# Patient Record
Sex: Female | Born: 1960 | Race: White | Hispanic: No | Marital: Married | Smoking: Never smoker
Health system: Southern US, Community
[De-identification: ages and names within clinical notes are randomized; demographics above are authoritative.]

## PROBLEM LIST (undated history)

## (undated) DIAGNOSIS — J189 Pneumonia, unspecified organism: Secondary | ICD-10-CM

## (undated) DIAGNOSIS — E039 Hypothyroidism, unspecified: Secondary | ICD-10-CM

## (undated) DIAGNOSIS — G4700 Insomnia, unspecified: Secondary | ICD-10-CM

## (undated) DIAGNOSIS — I5032 Chronic diastolic (congestive) heart failure: Secondary | ICD-10-CM

## (undated) DIAGNOSIS — Z87442 Personal history of urinary calculi: Secondary | ICD-10-CM

## (undated) DIAGNOSIS — R51 Headache: Secondary | ICD-10-CM

## (undated) DIAGNOSIS — M1990 Unspecified osteoarthritis, unspecified site: Secondary | ICD-10-CM

## (undated) DIAGNOSIS — M797 Fibromyalgia: Secondary | ICD-10-CM

## (undated) DIAGNOSIS — G43109 Migraine with aura, not intractable, without status migrainosus: Principal | ICD-10-CM

## (undated) DIAGNOSIS — K219 Gastro-esophageal reflux disease without esophagitis: Secondary | ICD-10-CM

## (undated) DIAGNOSIS — R519 Headache, unspecified: Secondary | ICD-10-CM

## (undated) DIAGNOSIS — I509 Heart failure, unspecified: Secondary | ICD-10-CM

## (undated) DIAGNOSIS — E785 Hyperlipidemia, unspecified: Secondary | ICD-10-CM

## (undated) DIAGNOSIS — I10 Essential (primary) hypertension: Secondary | ICD-10-CM

## (undated) DIAGNOSIS — Z8489 Family history of other specified conditions: Secondary | ICD-10-CM

## (undated) DIAGNOSIS — R0600 Dyspnea, unspecified: Secondary | ICD-10-CM

## (undated) DIAGNOSIS — I2510 Atherosclerotic heart disease of native coronary artery without angina pectoris: Secondary | ICD-10-CM

## (undated) HISTORY — DX: Migraine with aura, not intractable, without status migrainosus: G43.109

## (undated) HISTORY — DX: Hyperlipidemia, unspecified: E78.5

## (undated) HISTORY — PX: BACK SURGERY: SHX140

## (undated) HISTORY — DX: Headache: R51

## (undated) HISTORY — DX: Chronic diastolic (congestive) heart failure: I50.32

## (undated) HISTORY — DX: Headache, unspecified: R51.9

## (undated) HISTORY — PX: ABDOMINAL HYSTERECTOMY: SHX81

## (undated) HISTORY — PX: OTHER SURGICAL HISTORY: SHX169

---

## 1999-07-28 ENCOUNTER — Encounter: Admission: RE | Admit: 1999-07-28 | Discharge: 1999-07-28 | Payer: Self-pay | Admitting: Family Medicine

## 1999-07-28 ENCOUNTER — Encounter: Payer: Self-pay | Admitting: Family Medicine

## 1999-08-03 ENCOUNTER — Encounter: Admission: RE | Admit: 1999-08-03 | Discharge: 1999-08-03 | Payer: Self-pay | Admitting: Family Medicine

## 1999-08-03 ENCOUNTER — Encounter: Payer: Self-pay | Admitting: Family Medicine

## 1999-12-07 ENCOUNTER — Ambulatory Visit (HOSPITAL_COMMUNITY): Admission: RE | Admit: 1999-12-07 | Discharge: 1999-12-07 | Payer: Self-pay | Admitting: Family Medicine

## 1999-12-07 ENCOUNTER — Encounter: Payer: Self-pay | Admitting: Family Medicine

## 1999-12-14 ENCOUNTER — Encounter: Payer: Self-pay | Admitting: Family Medicine

## 1999-12-14 ENCOUNTER — Ambulatory Visit (HOSPITAL_COMMUNITY): Admission: RE | Admit: 1999-12-14 | Discharge: 1999-12-14 | Payer: Self-pay | Admitting: Family Medicine

## 2001-01-10 ENCOUNTER — Other Ambulatory Visit: Admission: RE | Admit: 2001-01-10 | Discharge: 2001-01-10 | Payer: Self-pay | Admitting: Family Medicine

## 2001-01-15 ENCOUNTER — Encounter: Payer: Self-pay | Admitting: Family Medicine

## 2001-01-15 ENCOUNTER — Encounter: Admission: RE | Admit: 2001-01-15 | Discharge: 2001-01-15 | Payer: Self-pay | Admitting: Family Medicine

## 2001-12-06 ENCOUNTER — Ambulatory Visit (HOSPITAL_COMMUNITY): Admission: RE | Admit: 2001-12-06 | Discharge: 2001-12-07 | Payer: Self-pay | Admitting: Neurosurgery

## 2001-12-06 ENCOUNTER — Encounter: Payer: Self-pay | Admitting: Neurosurgery

## 2002-06-05 ENCOUNTER — Encounter: Payer: Self-pay | Admitting: Family Medicine

## 2002-06-05 ENCOUNTER — Encounter: Admission: RE | Admit: 2002-06-05 | Discharge: 2002-06-05 | Payer: Self-pay | Admitting: Family Medicine

## 2003-08-10 ENCOUNTER — Ambulatory Visit (HOSPITAL_COMMUNITY): Admission: RE | Admit: 2003-08-10 | Discharge: 2003-08-10 | Payer: Self-pay | Admitting: Neurology

## 2003-12-31 ENCOUNTER — Encounter: Admission: RE | Admit: 2003-12-31 | Discharge: 2003-12-31 | Payer: Self-pay | Admitting: Otolaryngology

## 2004-02-15 ENCOUNTER — Other Ambulatory Visit: Admission: RE | Admit: 2004-02-15 | Discharge: 2004-02-15 | Payer: Self-pay | Admitting: Obstetrics and Gynecology

## 2004-06-13 ENCOUNTER — Encounter: Admission: RE | Admit: 2004-06-13 | Discharge: 2004-06-13 | Payer: Self-pay | Admitting: *Deleted

## 2004-06-22 ENCOUNTER — Encounter: Admission: RE | Admit: 2004-06-22 | Discharge: 2004-06-22 | Payer: Self-pay | Admitting: Gastroenterology

## 2004-06-24 ENCOUNTER — Encounter: Admission: RE | Admit: 2004-06-24 | Discharge: 2004-06-24 | Payer: Self-pay | Admitting: Gastroenterology

## 2004-08-29 ENCOUNTER — Ambulatory Visit: Payer: Self-pay

## 2004-11-03 ENCOUNTER — Ambulatory Visit: Payer: Self-pay | Admitting: Cardiovascular Disease

## 2004-11-03 ENCOUNTER — Ambulatory Visit (HOSPITAL_COMMUNITY): Admission: RE | Admit: 2004-11-03 | Discharge: 2004-11-03 | Payer: Self-pay | Admitting: Cardiology

## 2006-01-16 ENCOUNTER — Encounter: Admission: RE | Admit: 2006-01-16 | Discharge: 2006-01-16 | Payer: Self-pay | Admitting: Family Medicine

## 2007-02-08 ENCOUNTER — Ambulatory Visit (HOSPITAL_COMMUNITY): Admission: RE | Admit: 2007-02-08 | Discharge: 2007-02-08 | Payer: Self-pay | Admitting: Obstetrics and Gynecology

## 2007-02-18 ENCOUNTER — Encounter: Admission: RE | Admit: 2007-02-18 | Discharge: 2007-02-18 | Payer: Self-pay | Admitting: Obstetrics and Gynecology

## 2007-02-28 ENCOUNTER — Ambulatory Visit (HOSPITAL_COMMUNITY): Admission: RE | Admit: 2007-02-28 | Discharge: 2007-02-28 | Payer: Self-pay | Admitting: Obstetrics and Gynecology

## 2007-07-01 ENCOUNTER — Emergency Department (HOSPITAL_COMMUNITY): Admission: EM | Admit: 2007-07-01 | Discharge: 2007-07-01 | Payer: Self-pay | Admitting: Emergency Medicine

## 2007-08-20 ENCOUNTER — Encounter (INDEPENDENT_AMBULATORY_CARE_PROVIDER_SITE_OTHER): Payer: Self-pay | Admitting: Obstetrics and Gynecology

## 2007-08-20 ENCOUNTER — Ambulatory Visit (HOSPITAL_COMMUNITY): Admission: RE | Admit: 2007-08-20 | Discharge: 2007-08-21 | Payer: Self-pay | Admitting: Obstetrics and Gynecology

## 2007-09-01 ENCOUNTER — Inpatient Hospital Stay (HOSPITAL_COMMUNITY): Admission: AD | Admit: 2007-09-01 | Discharge: 2007-09-02 | Payer: Self-pay | Admitting: Obstetrics and Gynecology

## 2007-10-13 ENCOUNTER — Emergency Department (HOSPITAL_COMMUNITY): Admission: EM | Admit: 2007-10-13 | Discharge: 2007-10-13 | Payer: Self-pay | Admitting: Emergency Medicine

## 2008-01-20 ENCOUNTER — Encounter: Admission: RE | Admit: 2008-01-20 | Discharge: 2008-01-20 | Payer: Self-pay | Admitting: Obstetrics and Gynecology

## 2009-08-23 ENCOUNTER — Encounter: Admission: RE | Admit: 2009-08-23 | Discharge: 2009-08-23 | Payer: Self-pay | Admitting: Obstetrics and Gynecology

## 2010-04-03 ENCOUNTER — Encounter: Payer: Self-pay | Admitting: Family Medicine

## 2010-07-11 ENCOUNTER — Encounter (HOSPITAL_COMMUNITY): Payer: Self-pay | Admitting: Radiology

## 2010-07-11 ENCOUNTER — Emergency Department (HOSPITAL_COMMUNITY): Payer: PRIVATE HEALTH INSURANCE

## 2010-07-11 ENCOUNTER — Emergency Department (HOSPITAL_COMMUNITY)
Admission: EM | Admit: 2010-07-11 | Discharge: 2010-07-12 | Disposition: A | Discharge status: Home / Self Care | Payer: PRIVATE HEALTH INSURANCE | Attending: Emergency Medicine | Admitting: Emergency Medicine

## 2010-07-11 DIAGNOSIS — F411 Generalized anxiety disorder: Secondary | ICD-10-CM | POA: Insufficient documentation

## 2010-07-11 DIAGNOSIS — R141 Gas pain: Secondary | ICD-10-CM | POA: Insufficient documentation

## 2010-07-11 DIAGNOSIS — R110 Nausea: Secondary | ICD-10-CM | POA: Insufficient documentation

## 2010-07-11 DIAGNOSIS — R142 Eructation: Secondary | ICD-10-CM | POA: Insufficient documentation

## 2010-07-11 DIAGNOSIS — K5289 Other specified noninfective gastroenteritis and colitis: Secondary | ICD-10-CM | POA: Insufficient documentation

## 2010-07-11 DIAGNOSIS — M549 Dorsalgia, unspecified: Secondary | ICD-10-CM | POA: Insufficient documentation

## 2010-07-11 DIAGNOSIS — R109 Unspecified abdominal pain: Secondary | ICD-10-CM | POA: Insufficient documentation

## 2010-07-11 DIAGNOSIS — G8929 Other chronic pain: Secondary | ICD-10-CM | POA: Insufficient documentation

## 2010-07-11 LAB — CBC
HCT: 40.4 % (ref 36.0–46.0)
Hemoglobin: 13.5 g/dL (ref 12.0–15.0)
MCH: 29 pg (ref 26.0–34.0)
MCHC: 33.4 g/dL (ref 30.0–36.0)
MCV: 86.9 fL (ref 78.0–100.0)
Platelets: 206 K/uL (ref 150–400)
RBC: 4.65 MIL/uL (ref 3.87–5.11)
RDW: 13.8 % (ref 11.5–15.5)
WBC: 6.3 K/uL (ref 4.0–10.5)

## 2010-07-11 LAB — BASIC METABOLIC PANEL
BUN: 8 mg/dL (ref 6–23)
CO2: 24 mEq/L (ref 19–32)
GFR calc non Af Amer: >60 mL/min (ref >60)
Glucose, Bld: 83 mg/dL (ref 70–99)

## 2010-07-11 LAB — URINALYSIS, ROUTINE W REFLEX MICROSCOPIC
Glucose, UA: NEGATIVE mg/dL
Hgb urine dipstick: NEGATIVE
Ketones, ur: 15 mg/dL — AB
Specific Gravity, Urine: 1.011 (ref 1.005–1.030)
Urobilinogen, UA: 0.2 mg/dL (ref 0.0–1.0)

## 2010-07-11 LAB — DIFFERENTIAL
Basophils Absolute: 0 K/uL (ref 0.0–0.1)
Basophils Relative: 1 % (ref 0–1)
Eosinophils Absolute: 0.1 K/uL (ref 0.0–0.7)
Eosinophils Relative: 1 % (ref 0–5)
Lymphocytes Relative: 22 % (ref 12–46)
Lymphs Abs: 1.4 K/uL (ref 0.7–4.0)
Monocytes Absolute: 0.5 K/uL (ref 0.1–1.0)
Monocytes Relative: 8 % (ref 3–12)
Neutro Abs: 4.4 K/uL (ref 1.7–7.7)
Neutrophils Relative %: 69 % (ref 43–77)

## 2010-07-11 LAB — HEPATIC FUNCTION PANEL
ALT: 19 U/L (ref 0–35)
AST: 22 U/L (ref 0–37)
Albumin: 3.8 g/dL (ref 3.5–5.2)
Alkaline Phosphatase: 60 U/L (ref 39–117)
Bilirubin, Direct: <0.1 mg/dL (ref 0.0–0.3)
Total Bilirubin: 0.6 mg/dL (ref 0.3–1.2)
Total Protein: 7.4 g/dL (ref 6.0–8.3)

## 2010-07-11 LAB — POCT PREGNANCY, URINE: Preg Test, Ur: NEGATIVE

## 2010-07-11 LAB — LIPASE, BLOOD: Lipase: 21 U/L (ref 11–59)

## 2010-07-11 MED ORDER — IOHEXOL 300 MG/ML  SOLN
100.0000 mL | Freq: Once | INTRAMUSCULAR | Status: AC | PRN
  Administered 2010-07-11: 100 mL via INTRAVENOUS

## 2010-07-13 ENCOUNTER — Other Ambulatory Visit: Payer: Self-pay | Admitting: Gastroenterology

## 2010-07-13 DIAGNOSIS — R109 Unspecified abdominal pain: Secondary | ICD-10-CM

## 2010-07-18 ENCOUNTER — Other Ambulatory Visit: Payer: PRIVATE HEALTH INSURANCE

## 2010-07-26 NOTE — H&P (Signed)
NAMEDORTHEA, MAINA              ACCOUNT NO.:  [id number]   MEDICAL RECORD NO.:  [id number]          PATIENT TYPE:  AMB   LOCATION:  SDC                           FACILITY:  [facility]   PHYSICIAN:  Duke Salvia. [name], M.D.DATE OF BIRTH:  [birth year]   DATE OF ADMISSION:  DATE OF DISCHARGE:                              HISTORY & PHYSICAL   CHIEF COMPLAINT:  Chronic abdominal pelvic pain.   HISTORY OF PRESENT ILLNESS:  A 50 year old G2, P2 I saw recently for a  second opinion after she had been evaluated by another physician for a  right lower quadrant and right mid abdominal pain.  She was told by  their ultrasound that she had a uterine enlargement and would need TAH-  BSO.  They stated on their ultrasound report that there was slightly  enlarged uterus suspicious for fibroids or adenomyosis.  On further  questioning her pain has been more right lower quadrant above the  pelvis.  We reviewed a number of options with her and ultimately decided  to proceed with abdominal pelvic CT scan which was carried out and  showed normal appendix, no other abnormalities noted.  Due to continued  problems with pain she presents now for diagnostic laparoscopy.  This  procedure including risk of bleeding, infection, other complications  that may require open or additional surgery all reviewed with her which  she understands and accepts.   ALLERGIES:  None.   CURRENT MEDICATIONS:  1. Topamax.  2. Xanax.  3. Motrin p.r.n.   OBSTETRICAL HISTORY:  One vaginal delivery in '79, a cesarean section in  1985.   FAMILY HISTORY:  Significant for a sister who had a hysterectomy for  endometriosis, a brother with renal complications secondary to diabetes,  hypertension in mother, sister, brother and a history of diabetes.   PHYSICAL EXAM:  VITAL SIGNS:  Temperature 98.2, blood pressure 120/78.  HEENT:  Unremarkable.  NECK:  Supple without masses.  LUNGS:  Clear.  CARDIOVASCULAR:  Regular rate and rhythm  without murmurs, rubs, gallops.  BREASTS:  Without masses.  ABDOMEN:  Soft, flat and nontender.  No rebound.  PELVIC:  Exam normal external genitalia.  Vagina and cervix clear.  Uterus upper limits of normal size, mid to posterior.  Adnexa negative.  No unusual nodularity.  Adnexa unremarkable.  EXTREMITIES:  Unremarkable.  NEUROLOGIC:  Unremarkable.   IMPRESSION:  Chronic abdominal pelvic pain, pelvic to right mid  abdominal.   PLAN:  Diagnostic laparoscopy.  Procedure and risks were reviewed as  above.      Richard M. [name], M.D.  Electronically Signed     RMH/MEDQ  D:  02/22/2007  T:  02/22/2007  Job:  [id number]

## 2010-07-26 NOTE — Op Note (Signed)
[name], Maria Becker              ACCOUNT NO.:  [id number]   MEDICAL RECORD NO.:  [id number]          PATIENT TYPE:  AMB   LOCATION:  SDC                           [facility]   PHYSICIAN:  Duke Salvia. [name], M.D.DATE OF BIRTH:  11-01-1960   DATE OF PROCEDURE:  02/28/2007  DATE OF DISCHARGE:                               OPERATIVE REPORT   PREOPERATIVE DIAGNOSIS:  Chronic pelvic pain, possible adenomyosis.   POSTOPERATIVE DIAGNOSIS:  Chronic pelvic pain, possible adenomyosis.   PROCEDURE:  Diagnostic laparoscopy.   SURGEON:  Duke Salvia. [name], M.D.   ANESTHESIA:  General endotracheal.   COMPLICATIONS:  None.   SPECIMENS REMOVED:  None.   ESTIMATED BLOOD LOSS:  Minimal.   PROCEDURE AND FINDINGS:  The patient was taken to the operating room.  After an adequate level of general anesthesia was obtained with the  patient's legs in stirrups the abdomen, perineum and vagina were prepped  and draped in the usual manner for laparoscopy.  The bladder was  drained.  EUA carried out.  Uterus upper limit of normal size, mobile,  adnexa negative.  Hulka tenaculum was positioned.  Attention directed to  the abdomen where a 2 cm subumbilical incision was made after  infiltrating with 0.5% Marcaine plain.  A small incision was made and  the Veress needle introduced without difficulty.  Intra-abdominal  position verified by pressure and water testing.  After a 2.5 liter  pneumoperitoneum was then created, laparoscopic trocar and sleeve were  then introduced without difficulty.  There was no evidence of any  bleeding or trauma.  Three fingerbreadths above the symphysis in the  midline a 5 mm trocar was inserted under direct visualization.  The  patient was then placed in Trendelenburg and the pelvic findings were as  follows:   Upper abdomen was inspected.  Liver edge and gallbladder unremarkable.  The cecum, ileum, appendix were all inspected and noted to be normal.  Attention then  shifted to the pelvis.   The uterus itself was 6 weeks size with some globular enlargement which  was symmetric consistent with adenomyosis.  Bilateral adnexa, cul-de-sac  and the anterior space unremarkable except for some minimal scarring  from her prior C-section.  After these findings were photo documented  and instruments were removed, gas was allowed to escape.  Defects closed  with 4-0 Dexon subcuticular sutures and Dermabond.  She tolerated this  well and went to the recovery room in good condition.      Richard M. [name], M.D.  Electronically Signed     RMH/MEDQ  D:  02/28/2007  T:  03/01/2007  Job:  [id number]

## 2010-07-26 NOTE — Consult Note (Signed)
[name], [name]              ACCOUNT NO.:  [id number]   MEDICAL RECORD NO.:  [id number]          PATIENT TYPE:  EMS   LOCATION:  MAJO                         FACILITY:  [facility]   PHYSICIAN:  Levert Feinstein, MD          DATE OF BIRTH:  Jul 27, 1960   DATE OF CONSULTATION:  DATE OF DISCHARGE:  10/13/2007                                 CONSULTATION   CHIEF COMPLAINT:  Vertigo and headache.   HISTORY OF PRESENT ILLNESS:  The patient is a 50 year old right-handed  Caucasian female, had a history of migraine, and has been tapering off  preventive medication Topamax in January 2009.  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 Anne Becker' care.  She presented with acute  onset of vertigo on Friday.   She reported she woke up on Friday night in the mid of the night trying  to go to the bathroom, felt slight vertigo, but she was able to manage  to go to the bathroom and went to bed and falling to sleep afterwards.  When she woke up on Saturday morning at 7:30 am, she developed the  severe holocranial pounding headache with associated nausea, vomiting,  and the photo/phonophobia.  She took some Excedrin Migraine, her  headache eased up.  By 10 o'clock, her headache came back, when she got  up from the bed, she felt dizzy and this time it was intense vertigo,  room was spinning around, she has to hold on things to keep herself  steady.  In the meantime, she developed nausea and she vomited multiple  times.  Headache was in mild degree.  The main symptom was severe  nausea.  For the rest of the Saturday, she preferred to lie on her right  side, with sudden positional changes, especially turning to the left  side, she would develop worsening nausea and vomiting.   Today, she was much better,  she called me late this morning, reported  the symptoms, and was instructed to come to the emergency room.  Now,  she has mild bilateral  frontal headache, there was uneasy feeling, but  no vertigo anymore even with sudden positional change.  There was no  gait instability.   On review of systems, she did have acute onset of left hearing loss 4  days ago, there was occasionally left tinnitus, and fullness sensation.  She had a similar episode in April 2009, presented to the emergency  room.  CT of the brain was normal at that time.   PAST MEDICAL HISTORY:  Migraine.   PAST SURGICAL HISTORY:  Hysterectomy and back surgery in L4-L5 for low  back pain and left leg radicular type pain.   FAMILY HISTORY:  Noncontributory.   SOCIAL HISTORY:  Denies smoke or drink.   PHYSICAL EXAMINATION:  VITAL SIGNS:  Temperature 98.6, blood pressure  113/67, pulse 79, and respiration of 18.  CARDIAC:  Regular rate and rhythm.  PULMONARY:  Clear to auscultation bilaterally.  NECK:  Supple.  No carotid bruits.  NEUROLOGICAL:  She is  alert and oriented to history taking and care of  conversation.  There was no dysarthria and no aphasia.  Cranial nerves  II through XII.  Pupils were equal, round, and reactive to light and  accommodation.  Fundi were sharp bilaterally.  Visual fields were full  on confrontational test and extraocular movements were full.  She has  mild end-gaze horizontal/rotatory nystagmus, direction to the gaze  direction, and facial sensation and strength was normal.  Uvula and  tongue midline.  Head turning and shoulder shrugging were normal and  symmetric.  I have performed Epley maneuver.  There was no vertigo  illicit.  She does have decreased hearing on the left side with finger  rubbing.  Tuning fork: Weber sign was to the right side, air conduction  was more than bony conduction on both ears.  Motor examination:  Normal  tone, bulk, and strength.  Sensory:  Normal to light touch and pinprick.  Deep tendon reflexes normal and symmetric.  Plantar responses were  flexor.  Coordination:  Normal finger-to-nose and  heel-to-shin.  Gait:  Narrow based and steady.  She was able to perform tiptoe, heel, and  tandem walking without difficulty.  CT of the brain without contrast injection in April 2009 was normal.  MRI of the brain without contrast in 2005 has demonstrated Arnold Chiari  I malformation, tonsil was approximately 7.6 mm below the foramen  magnum.   Today's lab evaluation, UA was negative.  Urine pregnancy test was  negative.   ASSESSMENT/PLAN:  This is a 50 year old female, with history of  migraine, presenting with acute onset of vertigo, nausea, vomiting, and  there was a history of 4-days' acute onset of left hearing loss.  The  differentiation diagnoses including left acoustic neuroma, vestibular  neuritis, benign positional vertigo, Meniere disease, versus complicated  migraine, with her lack of vascular risk factors and quick recovery,  cerebellum/brainstem stroke is low on the differentiation list.  1. The patient is preferred to have further workup, such as MRI of the      brain, with and without contrast as outpatient, she will call Dr.      Anne Becker in the morning time to have a followup soon.  2. I agree with her plan, and I gave a prescription Valium 5 mg as      needed basis q.6 h. for symptomatic control and she is to follow up      with [facility] soon.      Levert Feinstein, MD  Electronically Signed     YY/MEDQ  D:  10/13/2007  T:  10/14/2007  Job:  [id number]

## 2010-07-26 NOTE — Op Note (Signed)
Maria Becker, Maria Becker              ACCOUNT NO.:  [id number]   MEDICAL RECORD NO.:  [id number]          PATIENT TYPE:  OIB   LOCATION:  9318                          [facility]ACILITY:  [facility]   PHYSICIAN:  Duke Salvia. [name], M.D.DATE OF BIRTH:  12/04/1960   DATE OF PROCEDURE:  08/20/2007  DATE OF DISCHARGE:                               OPERATIVE REPORT   PREOPERATIVE DIAGNOSES:  Chronic pelvic pain, dysmenorrhea, and probable  adenomyosis.   POSTOPERATIVE DIAGNOSIS:  Chronic pelvic pain, dysmenorrhea, and  probable adenomyosis.   PROCEDURE:  Laparoscopic supracervical hysterectomy, right salpingo-  oophorectomy.   SURGEON:  Duke Salvia. [name], MD.   ASSISTANT:  Guy Sandifer. Henderson Cloud, MD.   ANESTHESIA:  General endotracheal.   COMPLICATIONS:  None.   DRAINS:  Foley catheter.   BLOOD LOSS:  150.   SPECIMENS REMOVED:  Uterine fundus, right tube and ovary.   PROCEDURE AND FINDINGS:  The patient was taken to the operating room  after an adequate level of general endotracheal anesthesia was obtained.  The patient's legs in stirrups, the abdomen, perineum, and vagina were  prepped and draped in usual manner for [facility].  Foley catheter was  positioned.  No other vaginal instruments were used.  Subumbilical area  was infiltrated with 0.25% Marcaine plain.  Veress needle was introduced  without difficulty.  Its intra-abdominal position verified by pressure  and water testing.  After 2.5 L pneumoperitoneum was then created,  laparoscopic trocar and sleeve were then introduced without difficulty.  There was no evidence of any bleeding or trauma.  In the right and left  lower quadrant after a negative transillumination, a 10-mm bladeless  trocar was positioned under direct visualization.  With the patient in  Trendelenburg, the pelvic findings were as follows.   Uterus itself was slightly enlarged, 6-week size, globular at the  fundus, somewhat retroflexed.  Both tubes and ovaries were  unremarkable  as noted previously a laparoscopy.  Per patient request, since more pain  has been on the right side, decision made to proceed with RSO conserving  her left ovary again at her request.  The harmonic scalpel was then used  to coagulate and divide the right IP ligament down to and including the  round ligament assuring that the course of the ureter was well below on  the left, conserving the left ovary.  The utero-ovarian pedicle was  coagulated and divided down to and including the round ligament.  The  anterior peritoneum was then dissected with sharp and minimal blunt  dissection and the bladder was bluntly dissected slightly below.  The  ascending branch of the uterine artery on each side was then coagulated  and divided and the harmonic scalpel was then used to remove the fundus  with excellent hemostasis.  Morcellator was introduced into the left  lower quadrant and right tube and ovary were morcellated separately.  The remainder of the specimen was removed.  Copious irrigation was  carried out.  All tissue fragments were removed.  Pressure was reduced.  There was some minimal bleeding at the edge of the right dissection  right at  the cervix that was coagulated with first minimal bipolar  cautery and then using the Alliance PK coagulator.  This was observed  over 5 minutes with reduced pressure and noted to be hemostatic.  The  endocervical canal was then coagulated with the Alliance PK coagulator.  Intercede placed across the cervical stump.  Instruments were removed.  Gas allowed to escape.  Defects closed with 4-0 Dexon subcuticular  sutures and Dermabond.  The left lower quadrant incision.  The [employer]  retractors were used to expose the fascia which was closed with two  interrupted Vicryl sutures at the fascial level and a 4-0 Vicryl Rapide  subcuticular.  Clear urine noted to be in the case.  She tolerated this  well, went to recovery room in good  condition.      Richard M. [name], M.D.  Electronically Signed     RMH/MEDQ  D:  08/20/2007  T:  08/21/2007  Job:  [id number]

## 2010-07-26 NOTE — H&P (Signed)
Maria Becker, Maria Becker              ACCOUNT NO.:  [id number]   MEDICAL RECORD NO.:  [id number]          PATIENT TYPE:  AMB   LOCATION:  SDC                           [facility]   PHYSICIAN:  Duke Salvia. [name], M.D.DATE OF BIRTH:  1961-02-28   DATE OF ADMISSION:  02/28/2007  DATE OF DISCHARGE:  02/28/2007                              HISTORY & PHYSICAL   CHIEF COMPLAINT:  Chronic pelvic pain.   HISTORY OF PRESENT ILLNESS:  A 50 year old, G2, P2, 1 vaginal delivery  at 25, one C-section in 22.  I saw this patient originally as a second  opinion for chronic pelvic pain.  She had been advised that she had an  enlarged uterus and TAH-BSO was recommended.  A Pipelle endometrial  biopsy at that time in 2008 was normal.  Ultrasound was read as uterus  slightly enlarged, suspicious for fibroids, or adenomyosis.  Since that  time, she had a abdominal pelvic CT with contrast that showed a small  left adnexal cyst.  No other abnormalities noted.  Due to problems with  continued pelvic pain, she underwent diagnostic laparoscopy on December  2008 that showed possible adenomyosis based on symmetrically enlarged  uterus.  No other abnormalities were noted in the adnexal regions.  More  of her pain has been on mid and right lower quadrant.  We discussed the  possibility of hysterectomy with possible RSO.  After discussing  options, she prefers to proceed with LSH, possible RSO.  This procedure  including risk of bleeding, infection, adjacent organ injury, a small  percentage chance of monthly spotting, and the need for annual cytology  reviewed.  Other risks related to wound infection, phlebitis, along with  her expected recovery time reviewed, which she understands and accepts.   PAST MEDICAL HISTORY:   ALLERGIES:  None.  Operations:  Cesarean section, diagnostic laparoscopy   FAMILY HISTORY:  Significant for mother with history of ovarian cancer.  No other family members with ovarian  cancer.  Also, positive for  hypertension and stroke in her mother and brother who is on dialysis due  to diabetes and renal disease.  Most recent Pap follow up in 2008 was  normal.   PHYSICAL EXAM:  Temperature 98.2 and blood pressure 120/78.  HEENT: Unremarkable.  NECK:  Supple without masses.  LUNGS:  Clear.  CARDIOVASCULAR:  Rate and rhythm without murmurs, rubs, or gallops.  BREASTS:  Without masses.  ABDOMEN:  Soft, flat, and nontender.  PELVIC:  Normal external genitalia.  Vagina is obscured.  Uterus, normal  position and size.  Adnexa negative.  EXTREMITIES:  Unremarkable.  NEUROLOGIC:  Unremarkable.   IMPRESSION:  Chronic pelvic pain, possible adenomyosis.   PLAN:  LSH, possible RSO.  Procedure and risks reviewed as above.       Richard M. [name], M.D.  Electronically Signed     RMH/MEDQ  D:  08/14/2007  T:  08/15/2007  Job:  [id number]

## 2010-07-26 NOTE — Discharge Summary (Signed)
Maria Becker, Maria Becker              ACCOUNT NO.:  [id number]   MEDICAL RECORD NO.:  [id number]          PATIENT TYPE:  OIB   LOCATION:  9318                          [facility]ACILITY[facility]  [facility]   PHYSICIAN:  Duke Salvia. [name], M.D.DATE OF BIRTH:  January 19, 1961   DATE OF ADMISSION:  08/20/2007  DATE OF DISCHARGE:  08/21/2007                               DISCHARGE SUMMARY   DISCHARGE DIAGNOSES:  1. Chronic pelvic pain, probable adenomyosis.  2. Laparoscopic supracervical hysterectomy/right salpingo-oophorectomy      in this admission.   Summary of the history and physical exam, please see admission H and P  for details.  Briefly, a 50 year old with chronic pelvic pain, previous  laparoscopy showing the possibility of adenomyosis, presents for  definitive hysterectomy.   HOSPITAL COURSE:  On August 20, 2007, under general anesthesia, the patient  underwent LSH/RSO with conservation of her left ovary.  EBL was 150-200  mL.  Her catheter was removed on the day of surgery, and she was  observed overnight.  The following a.m., her abdominal exam was  unremarkable.  She was afebrile, tolerating a regular diet, voiding  normally, and was ready for discharge at that point.   LABORATORY DATA:  Blood type AB positive.  CMET on admission normal.  CBC:  WBC 7.1, hemoglobin 11.9, and hematocrit 34.4.  Postop on August 21, 2007, WBC 5.7, hemoglobin 9.3, hematocrit 27.8, and platelets 145,000.   DISPOSITION:  The patient is discharged on Tylox p.r.n. pain, Motrin  p.r.n., and multivitamins with iron once daily.  She will return to our  office in 7 days.  Advised to report fever over 101, vaginal bleeding,  incisional redness or drainage.  She was given specific instructions  regarding diet, sex, and exercise.   CONDITION:  Good.   ACTIVITY:  Graded increase.      Richard M. [name], M.D.  Electronically Signed     RMH/MEDQ  D:  08/21/2007  T:  08/21/2007  Job:  [id number]

## 2010-07-26 NOTE — Op Note (Signed)
Maria Becker, Maria Becker              ACCOUNT NO.:  [id number]   MEDICAL RECORD NO.:  [id number]          PATIENT TYPE:  OIB   LOCATION:  9318                          [facility]   PHYSICIAN:  Duke Salvia. [name], M.D.DATE OF BIRTH:  [birth year]   DATE OF PROCEDURE:  08/20/2007  DATE OF DISCHARGE:                               OPERATIVE REPORT   PREOPERATIVE DIAGNOSES:  1. Chronic pelvic pain.  2. Dysmenorrhea.  3. Probable adenomyosis.   POSTOPERATIVE DIAGNOSES:  1. Chronic pelvic pain.  2. Dysmenorrhea.  3. Probable adenomyosis.   PROCEDURES:  1. Laparoscopic supracervical hysterectomy.  2. Right salpingo-oophorectomy.   SURGEON:  Duke Salvia. [name], MD   ASSISTANT:  Guy Sandifer. Henderson Cloud, MD   ANESTHESIA:  General endotracheal.   COMPLICATIONS:  None.   DRAINS:  Foley catheter.   BLOOD LOSS:  150 mL.   PROCEDURE AND FINDINGS:  The patient was taken to the operating room.  After an adequate level of general anesthesia was obtained with the  patient's legs in stirrups, the abdomen, perineum, and vagina were  prepped and draped in usual manner for [facility].  Foley catheter in position  draining clear urine.  The subumbilical area was infiltrated with 0.25%  Marcaine.  A small incision was made.  The Veress needle was introduced  without difficulty.  Its intra-abdominal position verified by pressure and water testing.  After 2.5 L pneumoperitoneum was then created, laparoscopic trocar and  sleeve were then introduced without difficulty.   Dictation ended at this point.      Maria Becker, M.D.  Electronically Signed     RMH/MEDQ  D:  08/20/2007  T:  08/21/2007  Job:  [id number]

## 2010-07-29 NOTE — H&P (Signed)
Maria Becker, Maria Becker                        ACCOUNT NO.:  [id number]   MEDICAL RECORD NO.:  [id number]                   PATIENT TYPE:  OIB   LOCATION:  3019                                 FACILITY:  [facility]   PHYSICIAN:  Tanya Nones. Jeral Fruit, M.D.             DATE OF BIRTH:  12/06/1960   DATE OF ADMISSION:  12/06/2001  DATE OF DISCHARGE:  12/07/2001                                HISTORY & PHYSICAL   HISTORY OF PRESENT ILLNESS:  The patient is a lady whom I saw yesterday with  her husband because of a history of back pain and left leg which had been  getting worse since March of this year.  The pain is going from her back  down to the left leg and she is quite miserable.  The patient cannot sleep.  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  She denies any pain on the right leg.  She told me that  sometimes she has some difficulty lifting the left leg to the point that she  has to use her arm.   PAST MEDICAL HISTORY:  Negative.   SOCIAL HISTORY:  The patient does not smoke or drink.   FAMILY HISTORY:  Mother is 26 with high blood pressure, diabetes.  Father is  62 in good condition.   REVIEW OF SYSTEMS:  Positive for sinus headache, numbness of the hand, and  neck pain.   PHYSICAL EXAMINATION:  GENERAL:  The patient was looked at in my office and  severe leg pain in the left leg.  HEENT:  Normal.  NECK:  Normal.  LUNGS:  Clear.  CARDIAC:  Heart sounds normal.  ABDOMEN:  Normal.  EXTREMITIES:  Normal pulse.  NEUROLOGICAL:  Mental Status:  Normal.  Cranial Nerves:  Normal.  Strength  is 5/5 except in the left foot was seen as 3/5 weakness plantar flexion and  4/5 dorsiflexion.  The reflexes are present with decrease of the left ankle  jerk.   IMAGING STUDIES:  The MRI showed that she has some degenerative disk disease  at level L4-5 but the level L5-S1 she has a large herniated disk central to  the LS, displaced in the thecal  sac.   IMPRESSION:  Left L5-S1 herniated disk.   RECOMMENDATIONS:  The patient does not want any conservative treatment.  She  has agreed to go ahead with surgery.  The surgery was fully explained to her  and her husband.  The risks were explained such as infections, CSF leak,  worsening of pain, paralysis, need for further surgery ______ , damage to  the vessels of the abdomen.  The patient declined more opinions.  Tanya Nones. Jeral Fruit, M.D.   EMB/MEDQ  D:  12/06/2001  T:  12/09/2001  Job:  [id number]

## 2010-07-29 NOTE — Op Note (Signed)
   Maria Becker, Maria Becker                        ACCOUNT NO.:  [id number]   MEDICAL RECORD NO.:  [id number]                   PATIENT TYPE:  OIB   LOCATION:  3172                                 FACILITY:  [facility]   PHYSICIAN:  Tanya Nones. Jeral Fruit, M.D.             DATE OF BIRTH:  [birth year]   DATE OF PROCEDURE:  12/06/2001  DATE OF DISCHARGE:                                 OPERATIVE REPORT   PREOPERATIVE DIAGNOSIS:  Left L5-S1 herniated disk with a free fragment.   POSTOPERATIVE DIAGNOSIS:  Left L5-S1 herniated disk with a free fragment.   PROCEDURE:  Left L5-S1 diskectomy and foraminotomy, microscope Matrix  system.   SURGEON:  Tanya Nones. Jeral Fruit, M.D.   ASSISTANT:  Clydene Fake, M.D.   The patient was admitted because of back and left leg pain.  This problem  had been going on since March and was getting worse.  Clinically she has  weakness of the left foot.  An MRI showed a large disk with a fragment at  the L5-S1.  Surgery was advised.   PROCEDURE:  The patient was taken to the OR and she was placed in a prone  manner.  The back was prepped with Betadine.  With the C-arm, we were able  to localize the L5-S1 space.  Then a one-inch incision was made and  dissection into the fascia was accomplished.  Then using dilators, we were  able to go into the area between the L5-S1 lamina.  The dilator was attached  to the handheld retractor.  Then we brought the microscope into the area.  To be sure we did a lateral x-ray which showed that indeed that was the  area.  Then with the microscope we removed the lamina of L5-S1 and the  yellow ligament.  Immediately we found that the S1 nerve root was swollen,  reddish and displaced posterior and medially.  Retraction was made and  indeed there was a large herniated disk going to the foramen.   An incision was made and at least 10 ___________ fragments of disk were  removed.  We entered the disk space and total diskectomy medial and  lateral  was done.  Investigation above, below and medially was made of the  ____________.  Having done this the area was irrigated.  Valsalva maneuver  was negative.  Fentanyl and Depo-Medrol were left in the epidural space and  the wound was closed with Vicryl and Steri-Strips.                                               Tanya Nones. Jeral Fruit, M.D.    EMB/MEDQ  D:  12/06/2001  T:  12/09/2001  Job:  [id number]

## 2010-12-06 LAB — CBC
Platelets: 198
RDW: 13.7
WBC: 7.5

## 2010-12-06 LAB — URINE MICROSCOPIC-ADD ON

## 2010-12-06 LAB — URINALYSIS, ROUTINE W REFLEX MICROSCOPIC
Leukocytes, UA: NEGATIVE
Nitrite: NEGATIVE
Specific Gravity, Urine: 1.007
pH: 6

## 2010-12-06 LAB — T4, FREE: Free T4: 1.02

## 2010-12-06 LAB — DIFFERENTIAL
Basophils Absolute: 0
Lymphocytes Relative: 25
Neutro Abs: 5

## 2010-12-06 LAB — POCT I-STAT, CHEM 8
BUN: 13
Calcium, Ion: 1.18
HCT: 43
TCO2: 20

## 2010-12-08 LAB — COMPREHENSIVE METABOLIC PANEL
AST: 18
CO2: 23
Calcium: 8.8
Creatinine, Ser: 0.99
GFR calc Af Amer: >60
GFR calc non Af Amer: 60 — ABNORMAL LOW

## 2010-12-08 LAB — CBC
MCHC: 34.7
MCV: 83.7
Platelets: 145 — ABNORMAL LOW
RBC: 4.11
RBC: 3.82 — ABNORMAL LOW
RDW: 14.4
WBC: 8.7

## 2010-12-08 LAB — URINALYSIS, ROUTINE W REFLEX MICROSCOPIC
Nitrite: NEGATIVE
Specific Gravity, Urine: 1.01
Urobilinogen, UA: 0.2

## 2010-12-08 LAB — TYPE AND SCREEN: ABO/RH(D): AB POS

## 2010-12-08 LAB — PREGNANCY, URINE: Preg Test, Ur: NEGATIVE

## 2010-12-09 LAB — URINALYSIS, ROUTINE W REFLEX MICROSCOPIC
Glucose, UA: NEGATIVE
Nitrite: NEGATIVE
Protein, ur: NEGATIVE
pH: 7.5

## 2010-12-09 LAB — POCT PREGNANCY, URINE: Preg Test, Ur: NEGATIVE

## 2010-12-16 LAB — DIFFERENTIAL
Basophils Absolute: 0
Lymphocytes Relative: 24
Monocytes Absolute: 0.4
Neutro Abs: 4.1
Neutrophils Relative %: 67

## 2010-12-16 LAB — PREGNANCY, URINE: Preg Test, Ur: NEGATIVE

## 2010-12-16 LAB — HEPATIC FUNCTION PANEL
ALT: 15
AST: 19
Albumin: 4
Bilirubin, Direct: 0.1

## 2010-12-16 LAB — LIPID PANEL
HDL: 71
LDL Cholesterol: 159 — ABNORMAL HIGH
Total CHOL/HDL Ratio: 3.3
Triglycerides: 37
VLDL: 7

## 2010-12-16 LAB — CBC
Hemoglobin: 13.1
RBC: 4.31
RDW: 13.3

## 2011-06-26 ENCOUNTER — Emergency Department (HOSPITAL_COMMUNITY): Payer: Private Health Insurance - Indemnity

## 2011-06-26 ENCOUNTER — Emergency Department (HOSPITAL_COMMUNITY)
Admission: EM | Admit: 2011-06-26 | Discharge: 2011-06-26 | Disposition: A | Discharge status: Home / Self Care | Payer: Private Health Insurance - Indemnity | Attending: Emergency Medicine | Admitting: Emergency Medicine

## 2011-06-26 ENCOUNTER — Encounter (HOSPITAL_COMMUNITY): Payer: Self-pay

## 2011-06-26 DIAGNOSIS — R112 Nausea with vomiting, unspecified: Secondary | ICD-10-CM | POA: Insufficient documentation

## 2011-06-26 DIAGNOSIS — R42 Dizziness and giddiness: Secondary | ICD-10-CM

## 2011-06-26 DIAGNOSIS — H9190 Unspecified hearing loss, unspecified ear: Secondary | ICD-10-CM | POA: Insufficient documentation

## 2011-06-26 DIAGNOSIS — R5381 Other malaise: Secondary | ICD-10-CM | POA: Insufficient documentation

## 2011-06-26 DIAGNOSIS — IMO0001 Reserved for inherently not codable concepts without codable children: Secondary | ICD-10-CM | POA: Insufficient documentation

## 2011-06-26 DIAGNOSIS — I10 Essential (primary) hypertension: Secondary | ICD-10-CM | POA: Insufficient documentation

## 2011-06-26 DIAGNOSIS — K219 Gastro-esophageal reflux disease without esophagitis: Secondary | ICD-10-CM | POA: Insufficient documentation

## 2011-06-26 DIAGNOSIS — I498 Other specified cardiac arrhythmias: Secondary | ICD-10-CM | POA: Insufficient documentation

## 2011-06-26 DIAGNOSIS — R5383 Other fatigue: Secondary | ICD-10-CM | POA: Insufficient documentation

## 2011-06-26 HISTORY — DX: Essential (primary) hypertension: I10

## 2011-06-26 HISTORY — DX: Gastro-esophageal reflux disease without esophagitis: K21.9

## 2011-06-26 HISTORY — DX: Fibromyalgia: M79.7

## 2011-06-26 LAB — POCT I-STAT, CHEM 8
BUN: 17 mg/dL (ref 6–23)
Calcium, Ion: 1.15 mmol/L (ref 1.12–1.32)
Chloride: 106 mEq/L (ref 96–112)
Glucose, Bld: 102 mg/dL — ABNORMAL HIGH (ref 70–99)
Potassium: 3.9 mEq/L (ref 3.5–5.1)

## 2011-06-26 LAB — CBC
HCT: 37.4 % (ref 36.0–46.0)
Hemoglobin: 12.8 g/dL (ref 12.0–15.0)
MCV: 87.2 fL (ref 78.0–100.0)
RBC: 4.29 MIL/uL (ref 3.87–5.11)
WBC: 7.2 10*3/uL (ref 4.0–10.5)

## 2011-06-26 MED ORDER — ONDANSETRON HCL 4 MG/2ML IJ SOLN
4.0000 mg | Freq: Once | INTRAMUSCULAR | Status: AC
  Administered 2011-06-26: 4 mg via INTRAVENOUS
  Filled 2011-06-26: qty 2

## 2011-06-26 MED ORDER — ONDANSETRON HCL 4 MG PO TABS: 8.0000 mg | ORAL_TABLET | Freq: Three times a day (TID) | ORAL | Status: AC | PRN

## 2011-06-26 MED ORDER — MECLIZINE HCL 25 MG PO TABS
50.0000 mg | ORAL_TABLET | Freq: Once | ORAL | Status: AC
  Administered 2011-06-26: 50 mg via ORAL
  Filled 2011-06-26: qty 2

## 2011-06-26 MED ORDER — MECLIZINE HCL 12.5 MG PO TABS: 50.0000 mg | ORAL_TABLET | Freq: Three times a day (TID) | ORAL | Status: AC | PRN

## 2011-06-26 NOTE — ED Notes (Signed)
Family at bedside. 

## 2011-06-26 NOTE — ED Notes (Signed)
Pt to MRI

## 2011-06-26 NOTE — ED Provider Notes (Addendum)
History     CSN: [id number]  Arrival date & time 06/26/11  1330   First MD Initiated Contact with Patient 06/26/11 1339      Chief Complaint  Patient presents with  . Dizziness    (Consider location/radiation/quality/duration/timing/severity/associated sxs/prior treatment) HPI Complains of dizziness i.e. sensation of room moving onset approximately noon today accompanied by feeling of lightheadedness. Symptoms different than vertigo which she has suffered in the past. Associated symptoms include nausea and vomiting.. Treated by EMS with Zofran with partial relief of nausea. Symptoms worse with moving her head improved with remaining still. Other associated symptoms include tinnitus in left ear for past several years. 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 Fibromyalgia   . Hypertension   . GERD (gastroesophageal reflux disease)     Past Surgical History  Procedure Date  . Abdominal hysterectomy   . Back surgery   . L4-l5 fusion     History reviewed. No pertinent family history.  History  Substance Use Topics  . Smoking status: Never Smoker   . Smokeless tobacco: Not on file  . Alcohol Use: Yes     occasional    OB History    Grav Para Term Preterm Abortions TAB SAB Ect Mult Living                  Review of Systems  HENT: Positive for hearing loss and tinnitus.        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   Cardiovascular: Negative.   Gastrointestinal: Positive for nausea and vomiting.  Musculoskeletal: Negative.   Skin: Negative.   Neurological: Positive for dizziness and weakness.  Hematological: Negative.   Psychiatric/Behavioral: Negative.     Allergies  Review of patient's allergies indicates no known allergies.  Home Medications   Current Outpatient Rx  Name Route Sig Dispense Refill  . DESVENLAFAXINE SUCCINATE ER 50  MG PO TB24 Oral Take 50 mg by mouth every evening.    Marland Kitchen ESOMEPRAZOLE MAGNESIUM 40 MG PO CPDR Oral Take 40 mg by mouth every evening.    Marland Kitchen ROSUVASTATIN CALCIUM 5 MG PO TABS Oral Take 5 mg by mouth every evening.      Pulse 57  Temp 96.8 F (36 C)  Resp 18  SpO2 95%  Physical Exam  Constitutional: She appears well-developed and well-nourished.  HENT:  Head: Normocephalic and atraumatic.  Eyes: Conjunctivae are normal. Pupils are equal, round, and reactive to light.  Neck: Neck supple. No tracheal deviation present. No thyromegaly present.  Cardiovascular: Regular rhythm.   No murmur heard. Pulmonary/Chest: Effort normal and breath sounds normal.  Abdominal: Soft. Bowel sounds are normal. She exhibits no distension. There is no tenderness.  Musculoskeletal: Normal range of motion. She exhibits no edema and no tenderness.  Neurological: She is alert. She displays normal reflexes. Coordination normal.       Becomes vertiginous upon sitting up from a supine position, no lightheadedness gait normal Romberg normal pronator drift normal  Skin: Skin is warm and dry. No rash noted.  Psychiatric: She has a normal mood and affect.   Date: 06/26/2011  Rate: 55  Rhythm: sinus bradycardia  QRS Axis: normal  Intervals: normal  ST/T Wave abnormalities: normal  Conduction Disutrbances:none  Narrative Interpretation:   Old EKG Reviewed: none available  Date: 06/26/2011  Rate: 55  Rhythm: sinus bradycardia  QRS Axis: normal  Intervals:  normal  ST/T Wave abnormalities: normal  Conduction Disutrbances:none  Narrative Interpretation:   Old EKG Reviewed: none available Results for orders placed during the hospital encounter of 06/26/11  CBC      Component Value Range   WBC 7.2  4.0 - 10.5 (K/uL)   RBC 4.29  3.87 - 5.11 (MIL/uL)   Hemoglobin 12.8  12.0 - 15.0 (g/dL)   HCT 16.1  09.6 - 04.5 (%)   MCV 87.2  78.0 - 100.0 (fL)   MCH 29.8  26.0 - 34.0 (pg)   MCHC 34.2  30.0 - 36.0 (g/dL)   RDW  40.9  81.1 - 91.4 (%)   Platelets 190  150 - 400 (K/uL)   Mr Brain Wo Contrast  06/26/2011  *RADIOLOGY REPORT*  Clinical Data: 51 year old female with acute onset dizziness, nausea, syncope.  MRI HEAD WITHOUT CONTRAST  Technique:  Multiplanar, multiecho pulse sequences of the brain and surrounding structures were obtained according to standard protocol without intravenous contrast.  Comparison: Head CT 07/01/2007.  Brain MRI, MRV 08/10/2003 and earlier.  Findings: Stable mild cerebellar tonsillar ectopia. No restricted diffusion to suggest acute infarction.  No midline shift, mass effect, evidence of mass lesion, ventriculomegaly, extra-axial collection or acute intracranial hemorrhage.  Pituitary is within normal limits.  Major intracranial vascular flow voids are stable. Somewhat prominent superior ophthalmic veins are unchanged.  Other flow voids are within normal limits.    Occasional punctate areas of cerebral white matter T2 and FLAIR hyperintensity are within normal limits for age.  Deep gray matter nuclei and brainstem remain within normal limits.  Negative visualized cervical spine.  Stable lung grossly normal visualized internal auditory structures.  Visualized bone marrow signal is within normal limits.  Visualized orbit soft tissues are within normal limits.  Visualized paranasal sinuses and mastoids are clear.  Negative scalp soft tissues.  IMPRESSION: No acute intracranial abnormality. Negative for age noncontrast MRI appearance of the brain.  Original Report Authenticated By: Harley Hallmark, M.D.     ED Course  Procedures (including critical care time) 4 5 PM feels much improved after treatment with meclizine and Zofran in the emergency department Labs Reviewed - No data to display No results found.   No diagnosis found.    MDM  MRI of the brain ordered as patient stated vertigo felt different than vertigo she had in the past and history ofChiari 1 malformation and possible old  stroke   Vertigo felt to be peripheral in etiology Plan prescriptions meclizine, Zofran Referral Dr.Crossley, whom patient is seen in the past Diagnosis vertigo       Doug Sou, MD 06/26/11 1614  Doug Sou, MD 06/26/11 1620

## 2011-06-26 NOTE — Discharge Instructions (Signed)

## 2011-06-26 NOTE — ED Notes (Signed)
Pt returned from MRI °

## 2011-06-26 NOTE — ED Notes (Signed)
Patient presents to ed  [facility] c/o dizziness onset this am . States she has been having this off and on for 2 weeks.  States when she opens her eyes she gets nauseated.

## 2011-06-26 NOTE — ED Notes (Signed)
Pt c/o sudden onset of N/V starting approx 2 hrs ago, pt reports dizziness while sitting, lying, standing, moving her eyes, or moving her head suddenly weeks. Pt reports hx of (L) tinnitus and vertigo x3 years, pt denies any pain, states "I just feel weird." Pt reports all over body numbness.

## 2011-09-20 ENCOUNTER — Other Ambulatory Visit: Payer: Self-pay | Admitting: Obstetrics and Gynecology

## 2011-09-20 DIAGNOSIS — Z1231 Encounter for screening mammogram for malignant neoplasm of breast: Secondary | ICD-10-CM

## 2011-09-20 DIAGNOSIS — Z780 Asymptomatic menopausal state: Secondary | ICD-10-CM

## 2011-10-06 ENCOUNTER — Ambulatory Visit: Payer: PRIVATE HEALTH INSURANCE

## 2011-10-06 ENCOUNTER — Other Ambulatory Visit: Payer: PRIVATE HEALTH INSURANCE

## 2011-10-12 ENCOUNTER — Other Ambulatory Visit: Payer: Self-pay | Admitting: Otolaryngology

## 2011-10-12 DIAGNOSIS — H903 Sensorineural hearing loss, bilateral: Secondary | ICD-10-CM

## 2011-10-15 ENCOUNTER — Inpatient Hospital Stay: Admission: RE | Admit: 2011-10-15 | Payer: PRIVATE HEALTH INSURANCE | Source: Ambulatory Visit

## 2011-10-16 ENCOUNTER — Ambulatory Visit
Admission: RE | Admit: 2011-10-16 | Discharge: 2011-10-16 | Disposition: A | Discharge status: Home / Self Care | Payer: Private Health Insurance - Indemnity | Source: Ambulatory Visit | Attending: Otolaryngology | Admitting: Otolaryngology

## 2011-10-16 DIAGNOSIS — H903 Sensorineural hearing loss, bilateral: Secondary | ICD-10-CM

## 2011-10-16 MED ORDER — GADOBENATE DIMEGLUMINE 529 MG/ML IV SOLN: 17.0000 mL | Freq: Once | INTRAVENOUS | Status: AC | PRN

## 2011-10-24 ENCOUNTER — Other Ambulatory Visit: Payer: PRIVATE HEALTH INSURANCE

## 2011-10-24 ENCOUNTER — Ambulatory Visit: Payer: PRIVATE HEALTH INSURANCE

## 2012-01-09 ENCOUNTER — Other Ambulatory Visit: Payer: Self-pay

## 2012-01-09 ENCOUNTER — Ambulatory Visit: Payer: Self-pay

## 2012-10-01 ENCOUNTER — Other Ambulatory Visit: Payer: Self-pay | Admitting: Gastroenterology

## 2012-10-01 DIAGNOSIS — R1011 Right upper quadrant pain: Secondary | ICD-10-CM

## 2012-10-15 ENCOUNTER — Ambulatory Visit
Admission: RE | Admit: 2012-10-15 | Discharge: 2012-10-15 | Disposition: A | Discharge status: Home / Self Care | Payer: Private Health Insurance - Indemnity | Source: Ambulatory Visit | Attending: Gastroenterology | Admitting: Gastroenterology

## 2012-10-15 ENCOUNTER — Other Ambulatory Visit: Payer: Self-pay | Admitting: Gastroenterology

## 2012-10-15 DIAGNOSIS — R1011 Right upper quadrant pain: Secondary | ICD-10-CM

## 2012-11-26 ENCOUNTER — Other Ambulatory Visit (HOSPITAL_COMMUNITY): Payer: Self-pay | Admitting: Gastroenterology

## 2012-11-26 DIAGNOSIS — R1011 Right upper quadrant pain: Secondary | ICD-10-CM

## 2012-12-10 ENCOUNTER — Encounter (HOSPITAL_COMMUNITY): Payer: Private Health Insurance - Indemnity

## 2012-12-31 ENCOUNTER — Encounter (HOSPITAL_COMMUNITY): Payer: Private Health Insurance - Indemnity

## 2013-12-08 ENCOUNTER — Other Ambulatory Visit: Payer: Self-pay | Admitting: Obstetrics and Gynecology

## 2013-12-09 LAB — CYTOLOGY - PAP

## 2015-02-01 ENCOUNTER — Encounter: Payer: Self-pay | Admitting: Neurology

## 2015-02-01 ENCOUNTER — Ambulatory Visit (INDEPENDENT_AMBULATORY_CARE_PROVIDER_SITE_OTHER): Payer: Private Health Insurance - Indemnity | Admitting: Neurology

## 2015-02-01 ENCOUNTER — Telehealth: Payer: Self-pay | Admitting: Neurology

## 2015-02-01 VITALS — BP 126/76 | HR 76 | Ht 65.0 in | Wt 204.0 lb

## 2015-02-01 DIAGNOSIS — G43109 Migraine with aura, not intractable, without status migrainosus: Secondary | ICD-10-CM | POA: Diagnosis not present

## 2015-02-01 DIAGNOSIS — Z5181 Encounter for therapeutic drug level monitoring: Secondary | ICD-10-CM

## 2015-02-01 HISTORY — DX: Migraine with aura, not intractable, without status migrainosus: G43.109

## 2015-02-01 MED ORDER — MECLIZINE HCL 25 MG PO TABS
25.0000 mg | ORAL_TABLET | Freq: Three times a day (TID) | ORAL | Status: DC | PRN
Start: 2015-02-01 — End: 2015-07-20

## 2015-02-01 MED ORDER — TOPIRAMATE 25 MG PO TABS: ORAL_TABLET | ORAL | Status: DC

## 2015-02-01 MED ORDER — SUMATRIPTAN SUCCINATE 100 MG PO TABS: 100.0000 mg | ORAL_TABLET | Freq: Two times a day (BID) | ORAL | Status: DC | PRN

## 2015-02-01 MED ORDER — PREDNISONE 5 MG PO TABS: ORAL_TABLET | ORAL | Status: DC

## 2015-02-01 NOTE — Telephone Encounter (Signed)
[facility] the [facility].  She verified the Topamax is covered under patient's ins, however she has a very high co-pay for this Brand Name Only drug.  Cash price would be $462, and with ins it is ~ $435 per fill.  They are unsure why her co-pay is so much.  I called the ins to see if they could provide any additional info.  Spoke with Britta MccreedyBarbara.  She said the patient is being penalized for using Brand vs Generic.  Stated the patient must show a documented trial and failure of generic topiramate, and a letter of medical necessity is needed from provider before they will consider lowering the co-pay.  Please advise. Thank you.

## 2015-02-01 NOTE — Telephone Encounter (Signed)
I called patient. She confirms that generic Topamax previously was not effective, she was having ongoing headaches. The brand name was very effective. I will dictate a letter indicating this.

## 2015-02-01 NOTE — Patient Instructions (Addendum)
 Topamax (topiramate) is a seizure medication that has an FDA approval for seizures and for migraine headache. Potential side effects of this medication include weight loss, cognitive slowing, tingling in the fingers and toes, and carbonated drinks will taste bad. If any significant side effects are noted on this drug, please contact our office.  Migraine Headache A migraine headache is an intense, throbbing pain on one or both sides of your head. A migraine can last for 30 minutes to several hours. CAUSES  The exact cause of a migraine headache is not always known. However, a migraine may be caused when nerves in the brain become irritated and release chemicals that cause inflammation. This causes pain. Certain things may also trigger migraines, such as:  Alcohol.  Smoking.  Stress.  Menstruation.  Aged cheeses.  Foods or drinks that contain nitrates, glutamate, aspartame, or tyramine.  Lack of sleep.  Chocolate.  Caffeine.  Hunger.  Physical exertion.  Fatigue.  Medicines used to treat chest pain (nitroglycerine), birth control pills, estrogen, and some blood pressure medicines. SIGNS AND SYMPTOMS  Pain on one or both sides of your head.  Pulsating or throbbing pain.  Severe pain that prevents daily activities.  Pain that is aggravated by any physical activity.  Nausea, vomiting, or both.  Dizziness.  Pain with exposure to bright lights, loud noises, or activity.  General sensitivity to bright lights, loud noises, or smells. Before you get a migraine, you may get warning signs that a migraine is coming (aura). An aura may include:  Seeing flashing lights.  Seeing bright spots, halos, or zigzag lines.  Having tunnel vision or blurred vision.  Having feelings of numbness or tingling.  Having trouble talking.  Having muscle weakness. DIAGNOSIS  A migraine headache is often diagnosed based on:  Symptoms.  Physical exam.  A CT scan or MRI of your  head. These imaging tests cannot diagnose migraines, but they can help rule out other causes of headaches. TREATMENT Medicines may be given for pain and nausea. Medicines can also be given to help prevent recurrent migraines.  HOME CARE INSTRUCTIONS  Only take over-the-counter or prescription medicines for pain or discomfort as directed by your health care provider. The use of long-term narcotics is not recommended.  Lie down in a dark, quiet room when you have a migraine.  Keep a journal to find out what may trigger your migraine headaches. For example, write down:  What you eat and drink.  How much sleep you get.  Any change to your diet or medicines.  Limit alcohol consumption.  Quit smoking if you smoke.  Get 7-9 hours of sleep, or as recommended by your health care provider.  Limit stress.  Keep lights dim if bright lights bother you and make your migraines worse. SEEK IMMEDIATE MEDICAL CARE IF:   Your migraine becomes severe.  You have a fever.  You have a stiff neck.  You have vision loss.  You have muscular weakness or loss of muscle control.  You start losing your balance or have trouble walking.  You feel faint or pass out.  You have severe symptoms that are different from your first symptoms. MAKE SURE YOU:   Understand these instructions.  Will watch your condition.  Will get help right away if you are not doing well or get worse.   This information is not intended to replace advice given to you by your health care provider. Make sure you discuss any questions you have with your   health care provider.   Document Released: 02/27/2005 Document Revised: 03/20/2014 Document Reviewed: 11/04/2012 Elsevier Interactive Patient Education 2016 Elsevier Inc.  

## 2015-02-01 NOTE — Progress Notes (Signed)
Reason for visit: Headache  Referring physician: Dr. Tomi BambergerSusan Becker  Maria Becker is a 54 y.o. female  History of present illness:  Maria Becker is a 54 year old right-handed white female with a history of episodes of dizziness and headache in the past. The patient has been treated with Topamax and Imitrex previously with some benefit. The patient has had recurrence of her symptoms that began relatively suddenly on 12/22/2014. The patient has been having some blurring of vision over the last 2 months, but the headaches began in October. The patient indicates that the headaches will start with sparkling lights in the vision, then she will get vertigo, and then a headache will ensue beginning in the occipital area on the right, spreading forward with pain behind the right eye. The patient may have severe headache and dizziness for 2 days, she may miss work, and she is incapacitated with the headache. The patient indicates that she has photophobia and phonophobia with the headache. She does have nausea and vomiting. She has intermittent numbness in the hands that occurs at nighttime, she may have to shake out the hands at night. She does report some neck stiffness with headache. She has scalp tenderness. She has some cognitive clouding with the headache. She has had 12 such episodes since October. She has been under a lot of stress recently. She also indicates that weather changes may bring on headaches. She has increased tinnitus in the left ear. She comes to this office for an evaluation.  Past Medical History  Diagnosis Date  . Fibromyalgia   . Hypertension   . GERD (gastroesophageal reflux disease)   . Headache   . Hyperlipidemia   . 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 Abdominal hysterectomy    . Back surgery    . L4-l5 fusion      Family History  Problem Relation Age of Onset  . COPD Mother   . Heart disease Mother   .  Diabetes Mother   . Aneurysm Mother   . Melanoma Father   . Diabetes Father   . Diabetes Sister   . Diabetes Brother   . Heart attack Brother   . Kidney disease Brother   . Cancer - Colon Maternal Aunt   . Kidney disease Maternal Aunt   . Diabetes Maternal Aunt   . Hypothyroidism Paternal Aunt   . Diabetes Maternal Grandmother   . Heart disease Maternal Grandmother   . Diabetes Maternal Grandfather   . Heart disease Maternal Grandfather   . Diabetes Paternal Grandmother   . Heart disease Paternal Grandmother   . Diabetes Paternal Grandfather   . Heart disease Paternal Grandfather   . Aneurysm Maternal Uncle   . Aneurysm Cousin     Social history:  reports that she has never smoked. She has never used smokeless tobacco. She reports that she drinks alcohol. She reports that she does not use illicit drugs.  Medications:  Prior to Admission medications   Medication Sig Start Date End Date Taking? Authorizing Provider  acetaminophen (TYLENOL) 325 MG tablet Take 650 mg by mouth every 6 (six) hours as needed.   Yes Historical Provider, MD  diazepam (VALIUM) 5 MG tablet Take 5 mg by mouth every 6 (six) hours as needed.   Yes Historical Provider, MD  meclizine (ANTIVERT) 25 MG tablet Take 25 mg by mouth 3 (three) times daily as needed for dizziness.   Yes Historical Provider, MD  No Known Allergies  ROS:  Out of a complete 14 system review of symptoms, the patient complains only of the following symptoms, and all other reviewed systems are negative.  Chills Ringing in the ears, dizziness Blurred vision, eye pain Constipation Feeling hot, cold, increased thirst, flushing Headache, dizziness Racing thoughts  Blood pressure 126/76, pulse 76, height  (1.651 m), weight 204 lb (92.534 kg).  Physical Exam  General: The patient is alert and cooperative at the time of the examination. The patient is mildly to moderately obese.  Eyes: Pupils are equal, round, and reactive  to light. Discs are flat bilaterally.  Ears: Tympanic membranes are clear bilaterally.  Neck: The neck is supple, no carotid bruits are noted.  Respiratory: The respiratory examination is clear.  Cardiovascular: The cardiovascular examination reveals a regular rate and rhythm, no obvious murmurs or rubs are noted.  Neuromuscular: Range of movement of the cervical spine lacks about 15 of lateral rotation bilaterally.  Skin: Extremities are without significant edema.  Neurologic Exam  Mental status: The patient is alert and oriented x 3 at the time of the examination. The patient has apparent normal recent and remote memory, with an apparently normal attention span and concentration ability.  Cranial nerves: Facial symmetry is present. There is good sensation of the face to pinprick and soft touch bilaterally. The strength of the facial muscles and the muscles to head turning and shoulder shrug are normal bilaterally. Speech is well enunciated, no aphasia or dysarthria is noted. Extraocular movements are full. Visual fields are full. The tongue is midline, and the patient has symmetric elevation of the soft palate. No obvious hearing deficits are noted.  Motor: The motor testing reveals 5 over 5 strength of all 4 extremities. Good symmetric motor tone is noted throughout.  Sensory: Sensory testing is intact to pinprick, soft touch, vibration sensation, and position sense on all 4 extremities. No evidence of extinction is noted.  Coordination: Cerebellar testing reveals good finger-nose-finger and heel-to-shin bilaterally.  Gait and station: Gait is normal. Tandem gait is normal. Romberg is negative. No drift is seen.  Reflexes: Deep tendon reflexes are symmetric and normal bilaterally. Toes are downgoing bilaterally.   Assessment/Plan:  1. Basilar migraine  The patient has had onset of migraine headache associated with vision changes, vertigo, and right-sided headache. The headache  seems to be mediated through the right occipital nerve. The patient has had MRI evaluation in the past done in 2013 for vertigo, there were some questionable left-sided internal auditory canal abnormalities. I will repeat the MRI of the brain. The patient will be placed on Topamax, she was given a prescription as well for Imitrex and meclizine. She will follow-up in 4 months. Blood work will be done today to check kidney function.  Marlan Palau MD 02/01/2015 8:00 PM  [facility] [address] [place], [place] [address]  Phone [phone] Fax [phone]

## 2015-02-01 NOTE — Telephone Encounter (Signed)
Patient called to advise that insurance needs DAW form filled out (form saying Dr. Anne HahnWillis wants her to take brand only) for Topamax otherwise it will cost $435.

## 2015-02-02 ENCOUNTER — Telehealth: Payer: Self-pay

## 2015-02-02 LAB — BASIC METABOLIC PANEL
BUN / CREAT RATIO: 15 (ref 9–23)
BUN: 13 mg/dL (ref 6–24)
CO2: 25 mmol/L (ref 18–29)
Calcium: 9.6 mg/dL (ref 8.7–10.2)
Chloride: 102 mmol/L (ref 97–106)
Creatinine, Ser: 0.84 mg/dL (ref 0.57–1.00)
GFR, EST AFRICAN AMERICAN: 90 mL/min/{1.73_m2} (ref >59)
GFR, EST NON AFRICAN AMERICAN: 78 mL/min/{1.73_m2} (ref >59)
Glucose: 85 mg/dL (ref 65–99)
POTASSIUM: 4.4 mmol/L (ref 3.5–5.2)
SODIUM: 143 mmol/L (ref 136–144)

## 2015-02-02 NOTE — Telephone Encounter (Signed)
Spoke to patient. Gave lab results. Patient verbalized understanding.  

## 2015-02-02 NOTE — Telephone Encounter (Signed)
Info has been faxed to [employer] ins at [phone] Ref Patient ID # [id number] Ref Request # [id number]

## 2015-02-02 NOTE — Telephone Encounter (Signed)
-----   Message from York Spanielharles K Willis, MD sent at 02/02/2015  1:01 PM EST -----  The blood work results are unremarkable. Please call the patient.  ----- Message -----    From: [facility] Lab Results In Interface    Sent: 02/02/2015   7:44 AM      To: York Spanielharles K Willis, MD

## 2015-02-08 NOTE — Telephone Encounter (Signed)
Maria Becker has approved the request for coverage on Brand Name Topamax effective until 02/01/2018, or until the policy changes or is terminated Ref [id number] [place] P[place] [id number]  DM

## 2015-02-19 ENCOUNTER — Telehealth: Payer: Self-pay | Admitting: Neurology

## 2015-02-19 MED ORDER — PREDNISONE 5 MG PO TABS: ORAL_TABLET | ORAL | Status: DC

## 2015-02-19 NOTE — Telephone Encounter (Signed)
Patient called wanting to know if Dr. Anne HahnWillis can order a MRI C-SPINE along with her MRI BRAIN w/wo. She states that Dr. Jeral FruitBotero expressed to her that she had a bulging C1 & C2 and she is thinking that might be a reason of her symptoms as well. Patient is not feeling well. Please call and advise patient. Patient can be reached @ [phone]

## 2015-02-19 NOTE — Telephone Encounter (Signed)
Patient states she is not having pain. Lights and noise are driving her crazy though. She also gets extremely dizzy. She states she cannot function. She has been on the Topamax since 11/23 and cannot tell that it is helping at all. She states the Imitrex makes her go to sleep. If she takes Tylenol, she can stay awake and work. She would like to have MRI of cervical spine and brain done at the same time, if Dr. Anne HahnWillis is okay with that. MRI brain is scheduled for 12/14. Dr. Jeral FruitBotero told her 10 years ago that she would eventually need surgery on the bulging discs.

## 2015-02-19 NOTE — Telephone Encounter (Signed)
I called the patient. The patient is having increasing problems with headache, they are becoming daily, I'll call in a prednisone Dosepak for her. MRI the brain will see the C1-2 level, no need to check an MRI of the cervical spine.

## 2015-02-24 ENCOUNTER — Encounter (INDEPENDENT_AMBULATORY_CARE_PROVIDER_SITE_OTHER): Payer: Self-pay

## 2015-02-24 ENCOUNTER — Ambulatory Visit (INDEPENDENT_AMBULATORY_CARE_PROVIDER_SITE_OTHER): Payer: Managed Care, Other (non HMO)

## 2015-02-24 DIAGNOSIS — G43109 Migraine with aura, not intractable, without status migrainosus: Secondary | ICD-10-CM | POA: Diagnosis not present

## 2015-02-24 MED ORDER — GADOPENTETATE DIMEGLUMINE 469.01 MG/ML IV SOLN: 20.0000 mL | Freq: Once | INTRAVENOUS | Status: AC | PRN

## 2015-02-25 ENCOUNTER — Telehealth: Payer: Self-pay | Admitting: Neurology

## 2015-02-25 MED ORDER — TOPIRAMATE 50 MG PO TABS: ORAL_TABLET | ORAL | Status: DC

## 2015-02-25 MED ORDER — METOCLOPRAMIDE HCL 5 MG PO TABS: 5.0000 mg | ORAL_TABLET | Freq: Two times a day (BID) | ORAL | Status: DC

## 2015-02-25 NOTE — Telephone Encounter (Signed)
I called patient. The MRI the brain was relatively unremarkable, no change from prior study. The patient has had frequent episodes of dizziness, nausea, light sensitivity. She has a fullness sensation in the head, no overt migraine type headache. The patient likely still has a migraine process. The prednisone Dosepak did help transiently. I will increase the Topamax 200 mg at night for one week, then go to 150 mg at night. I will add Reglan taking 5 mg twice daily for the next 2 weeks. The patient is having severe episodes about every other day.   MRI brain 02/24/15:  IMPRESSION:  Mildly abnormal MRI brain (with and without) demonstrating: 1. Few small scattered bi-frontal subcortical foci of non-specific gliosis. 2. 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  3. No significant change from MRI on 10/16/11.

## 2015-03-01 ENCOUNTER — Telehealth: Payer: Self-pay | Admitting: Neurology

## 2015-03-01 NOTE — Telephone Encounter (Signed)
Maria Becker with [facility] [phone] called sts Ins will pay for 25mg  tablets but she is on higher dose now so need RX to match dose using 25mg  tablets. Need RX to read 4 tablets for 1wk at bedtime then increase to 6 tablets at bedtime.

## 2015-03-01 NOTE — Telephone Encounter (Signed)
Melanie with [facility] is calling and asking if the patient should be dispensed the brand name Rx Topamax 50 mg or the generic.  She is not sure as to whether the patient is requesting or what you prefer her to have.  Please call [phone].

## 2015-03-01 NOTE — Telephone Encounter (Signed)
I called back again Spoke with Chrissy.  They only have the Brand Name Topamax 25mg  available.  They will dispense 25mg  tabs with same instructions, so total dose will remain the same.  She processed the claim while I was on the line and verified ins did cover this Rx.

## 2015-03-01 NOTE — Telephone Encounter (Signed)
Per previous encounter, generic was not effective, so patient will take Brand Name.  I called back.  Was transferred to doctor line.  Left message.

## 2015-03-04 ENCOUNTER — Ambulatory Visit
Admission: RE | Admit: 2015-03-04 | Discharge: 2015-03-04 | Disposition: A | Discharge status: Home / Self Care | Payer: Private Health Insurance - Indemnity | Source: Ambulatory Visit | Attending: Internal Medicine | Admitting: Internal Medicine

## 2015-03-04 ENCOUNTER — Other Ambulatory Visit: Payer: Self-pay | Admitting: Internal Medicine

## 2015-03-04 DIAGNOSIS — M542 Cervicalgia: Secondary | ICD-10-CM

## 2015-03-16 ENCOUNTER — Telehealth: Payer: Self-pay | Admitting: Neurology

## 2015-03-16 MED ORDER — TOPIRAMATE 25 MG PO TABS: ORAL_TABLET | ORAL | Status: DC

## 2015-03-16 NOTE — Telephone Encounter (Signed)
I called the patient. She states that her migraines are still just as severe as they were before. The Topamax makes them a little less frequent, but they still come on suddenly. She had to have her husband pick her up from work again today. She states she cannot live like this. Dr. Alessandra BevelsVaughn ordered x-rays. The patient would like for Dr. Anne HahnWillis to look at these and advise what else can be done.

## 2015-03-16 NOTE — Telephone Encounter (Signed)
I called the patient. She is having episodes of migraine, one on Christmas Eve, Christmas Day, and one today. The Topamax has helped some, we will go up to a total of 200 mg daily. She will take 50 mg in the morning and 150 mg in the evening. If this is not effective, we will add medication such as propranolol to this. The patient indicates that the migraine episodes last about 4 hours.

## 2015-03-16 NOTE — Telephone Encounter (Signed)
Patient is calling to see after reading the results of her X-ray if Dr. Anne HahnWillis thinks she should get an MRI.  She is having terrible migraines and had to come home from work again today.  Please call.  Thanks!

## 2015-04-21 ENCOUNTER — Ambulatory Visit (INDEPENDENT_AMBULATORY_CARE_PROVIDER_SITE_OTHER): Payer: Managed Care, Other (non HMO) | Admitting: Interventional Cardiology

## 2015-04-21 ENCOUNTER — Encounter: Payer: Self-pay | Admitting: Cardiology

## 2015-04-21 VITALS — BP 118/86 | HR 81 | Ht 65.0 in | Wt 190.0 lb

## 2015-04-21 DIAGNOSIS — I10 Essential (primary) hypertension: Secondary | ICD-10-CM | POA: Diagnosis not present

## 2015-04-21 DIAGNOSIS — R002 Palpitations: Secondary | ICD-10-CM | POA: Diagnosis not present

## 2015-04-21 DIAGNOSIS — R079 Chest pain, unspecified: Secondary | ICD-10-CM | POA: Diagnosis not present

## 2015-04-21 NOTE — Patient Instructions (Addendum)
Medication Instructions:  Your physician recommends that you continue on your current medications as directed. Please refer to the Current Medication list given to you today.   Labwork: None ordered   Testing/Procedures: Your physician has requested that you have an echocardiogram. Echocardiography is a painless test that uses sound waves to create images of your heart. It provides your doctor with information about the size and shape of your heart and how well your heart's chambers and valves are working. This procedure takes approximately one hour. There are no restrictions for this procedure.  Your physician has recommended that you wear an event monitor. Event monitors are medical devices that record the heart's electrical activity. Doctors most often [place] these monitors to diagnose arrhythmias. Arrhythmias are problems with the speed or rhythm of the heartbeat. The monitor is a small, portable device. You can wear one while you do your normal daily activities. This is usually used to diagnose what is causing palpitations/syncope (passing out).    Follow-Up: Your physician recommends that you schedule a follow-up appointment in: 1 MONTH WITH BRITTANY SIMMONS, PA-C   Any Other Special Instructions Will Be Listed Below (If Applicable).  Echocardiogram An echocardiogram, or echocardiography, uses sound waves (ultrasound) to produce an image of your heart. The echocardiogram is simple, painless, obtained within a short period of time, and offers valuable information to your health care provider. The images from an echocardiogram can provide information such as:  Evidence of coronary artery disease (CAD).  Heart size.  Heart muscle function.  Heart valve function.  Aneurysm detection.  Evidence of a past heart attack.  Fluid buildup around the heart.  Heart muscle thickening.  Assess heart valve function. LET [facility] CARE PROVIDER KNOW ABOUT:  Any allergies you  have.  All medicines you are taking, including vitamins, herbs, eye drops, creams, and over-the-counter medicines.  Previous problems you or members of your family have had with the use of anesthetics.  Any blood disorders you have.  Previous surgeries you have had.  Medical conditions you have.  Possibility of pregnancy, if this applies. BEFORE THE PROCEDURE  No special preparation is needed. Eat and drink normally.  PROCEDURE   In order to produce an image of your heart, gel will be applied to your chest and a wand-like tool (transducer) will be moved over your chest. The gel will help transmit the sound waves from the transducer. The sound waves will harmlessly bounce off your heart to allow the heart images to be captured in real-time motion. These images will then be recorded.  You may need an IV to receive a medicine that improves the quality of the pictures. AFTER THE PROCEDURE You may return to your normal schedule including diet, activities, and medicines, unless your health care provider tells you otherwise.   This information is not intended to replace advice given to you by your health care provider. Make sure you discuss any questions you have with your health care provider.   Document Released: 02/25/2000 Document Revised: 03/20/2014 Document Reviewed: 11/04/2012 Elsevier Interactive Patient Education 2016 [employer]  Cardiac Event Monitoring A cardiac event monitor is a small recording device used to help detect abnormal heart rhythms (arrhythmias). The monitor is used to record heart rhythm when noticeable symptoms such as the following occur:  Fast heartbeats (palpitations), such as heart racing or fluttering.  Dizziness.  Fainting or light-headedness.  Unexplained weakness. The monitor is wired to two electrodes placed on your chest. Electrodes are flat, sticky disks  that attach to your skin. The monitor can be worn for up to 30 days. You will wear the  monitor at all times, except when bathing.  HOW TO USE YOUR CARDIAC EVENT MONITOR A technician will prepare your chest for the electrode placement. The technician will show you how to place the electrodes, how to work the monitor, and how to replace the batteries. Take time to practice using the monitor before you leave the office. Make sure you understand how to send the information from the monitor to your health care provider. This requires a telephone with a landline, not a cell phone. You need to:  Wear your monitor at all times, except when you are in water:  Do not get the monitor wet.  Take the monitor off when bathing. Do not swim or use a hot tub with it on.  Keep your skin clean. Do not put body lotion or moisturizer on your chest.  Change the electrodes daily or any time they stop sticking to your skin. You might need to use tape to keep them on.  It is possible that your skin under the electrodes could become irritated. To keep this from happening, try to put the electrodes in slightly different places on your chest. However, they must remain in the area under your left breast and in the upper right section of your chest.  Make sure the monitor is safely clipped to your clothing or in a location close to your body that your health care provider recommends.  Press the button to record when you feel symptoms of heart trouble, such as dizziness, weakness, light-headedness, palpitations, thumping, shortness of breath, unexplained weakness, or a fluttering or racing heart. The monitor is always on and records what happened slightly before you pressed the button, so do not worry about being too late to get good information.  Keep a diary of your activities, such as walking, doing chores, and taking medicine. It is especially important to note what you were doing when you pushed the button to record your symptoms. This will help your health care provider determine what might be contributing  to your symptoms. The information stored in your monitor will be reviewed by your health care provider alongside your diary entries.  Send the recorded information as recommended by your health care provider. It is important to understand that it will take some time for your health care provider to process the results.  Change the batteries as recommended by your health care provider. SEEK IMMEDIATE MEDICAL CARE IF:   You have chest pain.  You have extreme difficulty breathing or shortness of breath.  You develop a very fast heartbeat that persists.  You develop dizziness that does not go away.  You faint or constantly feel you are about to faint.   This information is not intended to replace advice given to you by your health care provider. Make sure you discuss any questions you have with your health care provider.   Document Released: 12/07/2007 Document Revised: 03/20/2014 Document Reviewed: 08/26/2012 Elsevier Interactive Patient Education [employer]  If you need a refill on your cardiac medications before your next appointment, please call your pharmacy.

## 2015-04-21 NOTE — Progress Notes (Signed)
04/21/2015 Maria Becker   1960-05-05  [id number]  Primary Physician Tomi Bamberger, NP Primary Cardiologist:  Seen by Dr. Katrinka Blazing in 2009   Reason for Visit/CC: PCP referral for palpitations and syncope  HPI:  The patient is a 55 y/o female, who presents as a new patient for cardiac evaluation. She was seen in the past by Dr. Jacinto Halim.  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 Complete cath results are unknown, however the patient reports that she did not require any intervention at that time. She has since been lost to f/u. Her other PMH is notable for family h/o CAD (mother with MI in her 3s, brother died from massive MI at age 83), HTN and untreated HLD  No personal h/o DM or tobacco use.   She presents to clinic, as advised by her PCP. She notes multiple complaints with symptoms occurring off and on for the last 3 months. She has had several episodes of profuse diaphoresis with associated dizziness and tachypalpitations. On more than 1 occasion this has resulted in syncope. She has been seen by a neurologist. Imaging studies were reported to be normal. She was diagnosed and treated for migraines however she has continued to have recurrent symptoms. In addition, she denies any HAs. She denies any exertional CP but does note occasional nocturnal chest discomfort. She admits that she has a h/o PUD/GERD. She was on Nexium at one point but discontinued. This is not a frequent occurrence. She is currently asymptomatic in clinic today and physical exam in benign.   EKG shows NST. HR 81 bpm. No ischemic abnormalities. BP is stable at 118/89. Orthostatic vitals are negative.    Current Outpatient Prescriptions  Medication Sig Dispense Refill  . acetaminophen (TYLENOL) 325 MG tablet Take 650 mg by mouth every 6 (six) hours as needed.    .  diazepam (VALIUM) 5 MG tablet Take 5 mg by mouth every 6 (six) hours as needed.    . meclizine (ANTIVERT) 25 MG tablet Take 1 tablet (25 mg total) by mouth 3 (three) times daily as needed for dizziness. 60 tablet 1  . metoCLOPramide (REGLAN) 5 MG tablet Take 1 tablet (5 mg total) by mouth 2 (two) times daily. 60 tablet 0  . SUMAtriptan (IMITREX) 100 MG tablet Take 1 tablet (100 mg total) by mouth 2 (two) times daily as needed for migraine. 10 tablet 2  . topiramate (TOPAMAX) 25 MG tablet Two tablets in the morning and 6 tablets in the evening 240 tablet 3   No current facility-administered medications for this visit.   Facility-Administered Medications Ordered in Other Visits  Medication Dose Route Frequency Provider Last Rate Last Dose  . gadopentetate dimeglumine (MAGNEVIST) injection 20 mL  20 mL Intravenous Once PRN York Spaniel, MD        No Known Allergies  Social History   Social History  . Marital Status: Married    Spouse Name: N/A  . Number of Children: 2  . Years of Education: 13   Occupational History  . self-employed    Social History Main Topics  . Smoking status: Never Smoker   . Smokeless tobacco: Never Used  . Alcohol Use: Yes     Comment: occasional  . Drug Use: No  . Sexual Activity: Yes    Birth Control/  Protection: Other-see comments   Other Topics Concern  . Not on file   Social History Narrative   Patient drinks 1 soda daily.   Patient is right handed.      Review of Systems: General: negative for chills, fever, night sweats or weight changes.  Cardiovascular: negative for chest pain, dyspnea on exertion, edema, orthopnea, palpitations, paroxysmal nocturnal dyspnea or shortness of breath Dermatological: negative for rash Respiratory: negative for cough or wheezing Urologic: negative for hematuria Abdominal: negative for nausea, vomiting, diarrhea, bright red blood per rectum, melena, or hematemesis Neurologic: negative for visual changes,  syncope, or dizziness All other systems reviewed and are otherwise negative except as noted above.    Blood pressure 118/86, pulse 81, height  (1.651 m), weight 190 lb (86.183 kg).  General appearance: alert, cooperative and no distress Neck: no carotid bruit and no JVD Lungs: clear to auscultation bilaterally Heart: regular rate and rhythm, S1, S2 normal, no murmur, click, rub or gallop Extremities: no LEE Pulses: 2+ and symmetric Skin: warm and dry Neurologic: Grossly normal  EKG NSR. 81 bpm. No ischemic abnormalities.   ASSESSMENT AND PLAN:   1. Palpitations and Syncope: I've discussed case with Dr. Katrinka Blazing, DOD. We will have the patent  wear a 30 day heart monitor to assess for cardiac arrhythmias. Will also arrange for a 2D echo to assesses for structural heart abnormalities. Based on lack of anginal symptoms, we will not pursue any ischemic w/u at this time.   PLAN  F/u in 4 weeks after heart monitor evaluation.   Robbie Lis PA-C 04/21/2015 1:36 PM The patient has been seen in conjunction with Robbie Lis, PA-C. All aspects of care have been considered and discussed. The patient has been personally interviewed, examined, and all clinical data has been reviewed.   The patient has been having odd spells of staring and loss of consciousness associated with twitching motor activity. She has been seen by neurology.  She is being seen on this occasion because of the history of palpitations.  Cardiac exam is unremarkable. A 30 day monitor is ordered to rule out significant arrhythmia and an echocardiogram is being done to identify the structural integrity of the heart.

## 2015-04-22 ENCOUNTER — Other Ambulatory Visit: Payer: Self-pay | Admitting: Cardiology

## 2015-04-22 DIAGNOSIS — R002 Palpitations: Secondary | ICD-10-CM

## 2015-04-22 DIAGNOSIS — R55 Syncope and collapse: Secondary | ICD-10-CM

## 2015-04-27 ENCOUNTER — Ambulatory Visit (INDEPENDENT_AMBULATORY_CARE_PROVIDER_SITE_OTHER): Payer: Managed Care, Other (non HMO)

## 2015-04-27 DIAGNOSIS — R55 Syncope and collapse: Secondary | ICD-10-CM

## 2015-04-27 DIAGNOSIS — R002 Palpitations: Secondary | ICD-10-CM | POA: Diagnosis not present

## 2015-05-04 ENCOUNTER — Ambulatory Visit (HOSPITAL_COMMUNITY): Payer: Managed Care, Other (non HMO) | Attending: Cardiology

## 2015-05-04 ENCOUNTER — Other Ambulatory Visit: Payer: Self-pay

## 2015-05-04 DIAGNOSIS — Z8249 Family history of ischemic heart disease and other diseases of the circulatory system: Secondary | ICD-10-CM | POA: Insufficient documentation

## 2015-05-04 DIAGNOSIS — R079 Chest pain, unspecified: Secondary | ICD-10-CM | POA: Insufficient documentation

## 2015-05-04 DIAGNOSIS — I340 Nonrheumatic mitral (valve) insufficiency: Secondary | ICD-10-CM | POA: Insufficient documentation

## 2015-05-04 DIAGNOSIS — E785 Hyperlipidemia, unspecified: Secondary | ICD-10-CM | POA: Insufficient documentation

## 2015-05-04 DIAGNOSIS — I071 Rheumatic tricuspid insufficiency: Secondary | ICD-10-CM | POA: Insufficient documentation

## 2015-05-18 ENCOUNTER — Ambulatory Visit: Payer: Managed Care, Other (non HMO) | Admitting: Cardiology

## 2015-06-01 ENCOUNTER — Ambulatory Visit: Payer: Managed Care, Other (non HMO) | Admitting: Cardiology

## 2015-06-08 ENCOUNTER — Ambulatory Visit (INDEPENDENT_AMBULATORY_CARE_PROVIDER_SITE_OTHER): Payer: Managed Care, Other (non HMO) | Admitting: Cardiology

## 2015-06-08 ENCOUNTER — Encounter: Payer: Self-pay | Admitting: Cardiology

## 2015-06-08 VITALS — BP 109/80 | HR 77 | Ht 65.0 in | Wt 180.0 lb

## 2015-06-08 DIAGNOSIS — R079 Chest pain, unspecified: Secondary | ICD-10-CM | POA: Diagnosis not present

## 2015-06-08 NOTE — Patient Instructions (Addendum)
Medication Instructions:  Your physician recommends that you continue on your current medications as directed. Please refer to the Current Medication list given to you today.   Labwork: None ordered  Testing/Procedures: Your physician has requested that you have en exercise stress myoview. For further information please visit [email or web address]. Please follow instruction sheet, as given.   Follow-Up: Your physician recommends that you schedule a follow-up appointment in: WILL BE BASED UPON YOUR TEST RESULTS   Any Other Special Instructions Will Be Listed Below (If Applicable).  Exercise Stress Electrocardiogram An exercise stress electrocardiogram is a test that is done to evaluate the blood supply to your heart. This test may also be called exercise stress electrocardiography. The test is done while you are walking on a treadmill. The goal of this test is to raise your heart rate. This test is done to find areas of poor blood flow to the heart by determining the extent of coronary artery disease (CAD).   CAD is defined as narrowing in one or more heart (coronary) arteries of more than 70%. If you have an abnormal test result, this may mean that you are not getting adequate blood flow to your heart during exercise. Additional testing may be needed to understand why your test was abnormal. LET [facility] Inc[facility] CARE PROVIDER KNOW ABOUT:   Any allergies you have.  All medicines you are taking, including vitamins, herbs, eye drops, creams, and over-the-counter medicines.  Previous problems you or members of your family have had with the use of anesthetics.  Any blood disorders you have.  Previous surgeries you have had.  Medical conditions you have.  Possibility of pregnancy, if this applies. RISKS AND COMPLICATIONS Generally, this is a safe procedure. However, as with any procedure, complications can occur. Possible complications can include:  Pain or pressure in the following  areas:  Chest.  Jaw or neck.  Between your shoulder blades.  Radiating down your left arm.  Dizziness or light-headedness.  Shortness of breath.  Increased or irregular heartbeats.  Nausea or vomiting.  Heart attack (rare). BEFORE THE PROCEDURE  Avoid all forms of caffeine 24 hours before your test or as directed by your health care provider. This includes coffee, tea (even decaffeinated tea), caffeinated sodas, chocolate, cocoa, and certain pain medicines.  Follow your health care provider's instructions regarding eating and drinking before the test.  Take your medicines as directed at regular times with water unless instructed otherwise. Exceptions may include:  If you have diabetes, ask how you are to take your insulin or pills. It is common to adjust insulin dosing the morning of the test.  If you are taking beta-blocker medicines, it is important to talk to your health care provider about these medicines well before the date of your test. Taking beta-blocker medicines may interfere with the test. In some cases, these medicines need to be changed or stopped 24 hours or more before the test.  If you wear a nitroglycerin patch, it may need to be removed prior to the test. Ask your health care provider if the patch should be removed before the test.  If you use an inhaler for any breathing condition, bring it with you to the test.  If you are an outpatient, bring a snack so you can eat right after the stress phase of the test.  Do not smoke for 4 hours prior to the test or as directed by your health care provider.  Do not apply lotions, powders, creams, or oils on your  chest prior to the test.  Wear loose-fitting clothes and comfortable shoes for the test. This test involves walking on a treadmill. PROCEDURE  Multiple patches (electrodes) will be put on your chest. If needed, small areas of your chest may have to be shaved to get better contact with the electrodes. Once  the electrodes are attached to your body, multiple wires will be attached to the electrodes and your heart rate will be monitored.  Your heart will be monitored both at rest and while exercising.  You will walk on a treadmill. The treadmill will be started at a slow pace. The treadmill speed and incline will gradually be increased to raise your heart rate. AFTER THE PROCEDURE  Your heart rate and blood pressure will be monitored after the test.  You may return to your normal schedule including diet, activities, and medicines, unless your health care provider tells you otherwise.   This information is not intended to replace advice given to you by your health care provider. Make sure you discuss any questions you have with your health care provider.   Document Released: 02/25/2000 Document Revised: 03/04/2013 Document Reviewed: 11/04/2012 Elsevier Interactive Patient Education [employer]    If you need a refill on your cardiac medications before your next appointment, please call your pharmacy.

## 2015-06-08 NOTE — Progress Notes (Signed)
06/08/2015 Maria LoganDonna C Knebel   [birth year]  [id number]  Primary Physician Tomi BambergerFULLER,SUSAN, NP Primary Cardiologist: Dr. Katrinka BlazingSmith   Reason for Visit/CC: F/u for palpitations and syncope  HPI:  55 y/o female, who was initially referred to our practice in Feb. 2017 by her PCP for palpitations and syncope. Of note, she was seen in the past by Dr. Jacinto HalimGanji. We did not have access to her records at time of initial new patient visit, however per PCP and patient report, she had a cardiac CT in 2011 that showed a 50% soft plaque in the proximal LAD, leading to a heart catheretization. Complete cath results are unknown, however the patient reports that she did not require any intervention at that time.  Her other PMH is notable for family h/o CAD (mother with MI in her 1750s, brother died from massive MI at age 57), HTN and untreated HLD. No personal h/o DM or tobacco use.   I evaluated her at time of her new patient appointment. She was also seen by Dr. Katrinka BlazingSmith at that time. She complained of odd spells of staring and loss of consciousness associated with twitching motor activity. She had been seen by neurology but w/u was negative. Her cardiac exam was unremarkable as well as her EKG. Orthostatic VS were also negative. Dr. Katrinka BlazingSmith ordered for her to be further evaluated with a 2D echo to assess the structural integrity of the heart as well as a 30 day monitor to r/o significant arrthymias. She was advised to f/u in 1 month.   Studies have been reviewed. 2D echo showed normal LVEF of 55-60%. Wall motion was normal w/o abnormalities. There were no significant valvular abnormalities. Only mild MR was noted. No pericardial effusion. 30 day monitor was unrevealing of any significant abnormalities. She maintained SR. HR ranged from 47-114 bpm. Average HR was 68 bpm.   She notes that she continues to have epiosdoes. Unfortunately, she notes that she did not have a spell while wearing the monitor. She does note occasional chest  tightness that occurs sometimes with activity. She is concerned given her strong family h/o CAD.     Current Outpatient Prescriptions  Medication Sig Dispense Refill  . Cholecalciferol ([employer] VIT D 5000 HIGH-POTENCY PO) Take by mouth.    . Cyanocobalamin (VITAMIN B 12 PO) Take 2,500 mcg by mouth daily.    . metoCLOPramide (REGLAN) 5 MG tablet Take 5 mg by mouth every 6 (six) hours as needed for nausea (MIGRAINES).    . Omega-3 Fatty Acids (OMEGA-3 FISH OIL PO) Take 1 capsule by mouth 2 (two) times daily. OMEGA PURE 820    . progesterone (PROMETRIUM) 200 MG capsule Take 200 mg by mouth daily.    Marland Kitchen. b complex vitamins tablet Take 1 tablet by mouth daily.    . diazepam (VALIUM) 5 MG tablet Take 5 mg by mouth every 6 (six) hours as needed (VERTIGO).     Marland Kitchen. KLOR-CON 10 10 MEQ tablet Take 10 mg by mouth 2 (two) times daily.    Marland Kitchen. liothyronine (CYTOMEL) 5 MCG tablet Take 5 mcg by mouth daily before breakfast.    . meclizine (ANTIVERT) 25 MG tablet Take 1 tablet (25 mg total) by mouth 3 (three) times daily as needed for dizziness. 60 tablet 1  . Multiple Vitamins-Minerals (MULTIVITAMIN WITH MINERALS) tablet Take 1 tablet by mouth daily.    . Probiotic Product (PROBIOTIC & ACIDOPHILUS EX ST PO) Take 1 tablet by mouth daily.    . SUMAtriptan (IMITREX)  100 MG tablet Take 1 tablet (100 mg total) by mouth 2 (two) times daily as needed for migraine. 10 tablet 2  . TOPAMAX 25 MG tablet Take 200 mg by mouth every evening.     No current facility-administered medications for this visit.   Facility-Administered Medications Ordered in Other Visits  Medication Dose Route Frequency Provider Last Rate Last Dose  . gadopentetate dimeglumine (MAGNEVIST) injection 20 mL  20 mL Intravenous Once PRN York Spaniel, MD        No Known Allergies  Social History   Social History  . Marital Status: Married    Spouse Name: N/A  . Number of Children: 2  . Years of Education: 13   Occupational History  .  self-employed    Social History Main Topics  . Smoking status: Never Smoker   . Smokeless tobacco: Never Used  . Alcohol Use: Yes     Comment: occasional  . Drug Use: No  . Sexual Activity: Yes    Birth Control/ Protection: Other-see comments   Other Topics Concern  . Not on file   Social History Narrative   Patient drinks 1 soda daily.   Patient is right handed.      Review of Systems: General: negative for chills, fever, night sweats or weight changes.  Cardiovascular: negative for chest pain, dyspnea on exertion, edema, orthopnea, palpitations, paroxysmal nocturnal dyspnea or shortness of breath Dermatological: negative for rash Respiratory: negative for cough or wheezing Urologic: negative for hematuria Abdominal: negative for nausea, vomiting, diarrhea, bright red blood per rectum, melena, or hematemesis Neurologic: negative for visual changes, syncope, or dizziness All other systems reviewed and are otherwise negative except as noted above.    Blood pressure 109/80, pulse 77, height  (1.651 m), weight 180 lb (81.647 kg).  General appearance: alert, cooperative and no distress Neck: no carotid bruit and no JVD Lungs: clear to auscultation bilaterally Heart: regular rate and rhythm, S1, S2 normal, no murmur, click, rub or gallop Extremities: no LEE Pulses: 2+ and symmetric Skin: warm and dry Neurologic: Grossly normal  EKG not performed  ASSESSMENT AND PLAN:   Patient presents for f/u after being referred to our practice last month for palpitations and odd spells of staring and loss of consciousness associated with twitching motor activity. She was seen by Dr. Katrinka Blazing who ordered a 30 day monitor to assess for arrhthymias and a 2D echo to assess for structural heart disease. Both studies were normal/ unremarkable. There is concern given her family h/o CAD/ MI in multiple first degree releatives, evidence of plaque in LAD system in 2006 and recent symptoms of chest  tightness, along with her other complaints. For this reason, we will order an exercise NST to r/o the possibility of ischemia. If normal, will not plan for further cardiac w/u. ? Neurogenic syncope.    PLAN  F/u if stress test is abnormal. If normal results, no further cardiac w/u will be planned and she could f/u as needed.   SIMMONS, BRITTAINY PA-C 06/08/2015 11:09 AM

## 2015-06-21 ENCOUNTER — Ambulatory Visit: Payer: Private Health Insurance - Indemnity | Admitting: Neurology

## 2015-07-05 ENCOUNTER — Encounter (HOSPITAL_COMMUNITY): Payer: Managed Care, Other (non HMO)

## 2015-07-13 ENCOUNTER — Telehealth: Payer: Self-pay | Admitting: Neurology

## 2015-07-13 NOTE — Telephone Encounter (Addendum)
Patient is calling and states her [facility]'s [facility] has [facility] #[id number], Group [phone]#[id number]. [phone] [facility]). The patient states that Dr. Anne HahnWillis will have to get her Topamax 25 mg approved by [facility] as right now they will not cover the medication.  She states she cannot take the generic.

## 2015-07-14 ENCOUNTER — Telehealth (HOSPITAL_COMMUNITY): Payer: Self-pay | Admitting: *Deleted

## 2015-07-14 NOTE — Telephone Encounter (Signed)
Patient given detailed instructions per Myocardial Perfusion Study Information Sheet for the test on 07/19/15 Patient notified to arrive 15 minutes early and that it is imperative to arrive on time for appointment to keep from having the test rescheduled.  If you need to cancel or reschedule your appointment, please call the office within 24 hours of your appointment. Failure to do so may result in a cancellation of your appointment, and a $50 no show fee. Patient verbalized understanding.Areeb Corron J Donica Derouin, RN  

## 2015-07-14 NOTE — Telephone Encounter (Addendum)
Spoke to pt who says that she does have refills available for brand Topamax (as she cannot tolerate generic). She currently declined rescheduling her appt from last month that was cancelled d/t change in insurance. Says that she is doing well at this time but agreed to call back to schedule appt when needed. [facility] and [facility] for brand name medication is in progress.

## 2015-07-16 ENCOUNTER — Other Ambulatory Visit: Payer: Self-pay

## 2015-07-16 MED ORDER — TOPAMAX 25 MG PO TABS: 200.0000 mg | ORAL_TABLET | Freq: Every day | ORAL | Status: DC

## 2015-07-16 NOTE — Telephone Encounter (Signed)
Received fax from pharmacy for refill. Retailed as requested.

## 2015-07-19 ENCOUNTER — Telehealth: Payer: Self-pay | Admitting: Neurology

## 2015-07-19 ENCOUNTER — Ambulatory Visit (HOSPITAL_BASED_OUTPATIENT_CLINIC_OR_DEPARTMENT_OTHER): Payer: 59

## 2015-07-19 DIAGNOSIS — R079 Chest pain, unspecified: Secondary | ICD-10-CM | POA: Diagnosis not present

## 2015-07-19 DIAGNOSIS — R002 Palpitations: Secondary | ICD-10-CM | POA: Insufficient documentation

## 2015-07-19 DIAGNOSIS — R9439 Abnormal result of other cardiovascular function study: Secondary | ICD-10-CM | POA: Insufficient documentation

## 2015-07-19 DIAGNOSIS — R55 Syncope and collapse: Secondary | ICD-10-CM

## 2015-07-19 DIAGNOSIS — R0602 Shortness of breath: Secondary | ICD-10-CM | POA: Insufficient documentation

## 2015-07-19 LAB — MYOCARDIAL PERFUSION IMAGING
CHL CUP MPHR: 164 {beats}/min
CSEPEDS: 0 s
CSEPEW: 8.5 METS
CSEPHR: 92 %
Exercise duration (min): 7 min
LV dias vol: 90 mL (ref 46–106)
LVSYSVOL: 41 mL
NUC STRESS TID: 0.91
Peak HR: 151 {beats}/min
RATE: 0.32
Rest HR: 58 {beats}/min
SDS: 4
SRS: 1
SSS: 5

## 2015-07-19 MED ORDER — TECHNETIUM TC 99M SESTAMIBI GENERIC - CARDIOLITE
30.3000 | Freq: Once | INTRAVENOUS | Status: AC | PRN
  Administered 2015-07-19: 30.3 via INTRAVENOUS

## 2015-07-19 MED ORDER — TECHNETIUM TC 99M SESTAMIBI GENERIC - CARDIOLITE
10.7000 | Freq: Once | INTRAVENOUS | Status: AC | PRN
  Administered 2015-07-19: 11 via INTRAVENOUS

## 2015-07-19 NOTE — Telephone Encounter (Signed)
[facility], apparently Topamax is nonformulary, and has been denied. The formulated drugs are Depakote and gabapentin. The patient was last seen in the fall of 2016, she does not have a revisit, apparent she cancel the revisit in April. I will get another revisit for her, we will discuss the issue with the Topamax.

## 2015-07-20 ENCOUNTER — Encounter (HOSPITAL_COMMUNITY)
Admission: AD | Disposition: A | Discharge status: Home / Self Care | Payer: Self-pay | Source: Ambulatory Visit | Attending: Internal Medicine

## 2015-07-20 ENCOUNTER — Inpatient Hospital Stay (HOSPITAL_COMMUNITY): Payer: 59

## 2015-07-20 ENCOUNTER — Inpatient Hospital Stay (HOSPITAL_COMMUNITY)
Admission: AD | Admit: 2015-07-20 | Discharge: 2015-07-20 | DRG: 287 | Disposition: A | Discharge status: Home / Self Care | Payer: 59 | Source: Ambulatory Visit | Attending: Internal Medicine | Admitting: Internal Medicine

## 2015-07-20 ENCOUNTER — Encounter: Payer: Self-pay | Admitting: Cardiology

## 2015-07-20 ENCOUNTER — Telehealth: Payer: Self-pay

## 2015-07-20 ENCOUNTER — Encounter (HOSPITAL_COMMUNITY): Payer: Self-pay

## 2015-07-20 ENCOUNTER — Ambulatory Visit (INDEPENDENT_AMBULATORY_CARE_PROVIDER_SITE_OTHER): Payer: 59 | Admitting: Cardiology

## 2015-07-20 VITALS — BP 110/70 | HR 80 | Ht 65.0 in | Wt 173.1 lb

## 2015-07-20 DIAGNOSIS — Z8249 Family history of ischemic heart disease and other diseases of the circulatory system: Secondary | ICD-10-CM

## 2015-07-20 DIAGNOSIS — I25119 Atherosclerotic heart disease of native coronary artery with unspecified angina pectoris: Principal | ICD-10-CM | POA: Diagnosis present

## 2015-07-20 DIAGNOSIS — Z981 Arthrodesis status: Secondary | ICD-10-CM

## 2015-07-20 DIAGNOSIS — I10 Essential (primary) hypertension: Secondary | ICD-10-CM | POA: Diagnosis present

## 2015-07-20 DIAGNOSIS — K219 Gastro-esophageal reflux disease without esophagitis: Secondary | ICD-10-CM | POA: Diagnosis present

## 2015-07-20 DIAGNOSIS — R079 Chest pain, unspecified: Secondary | ICD-10-CM

## 2015-07-20 DIAGNOSIS — I2510 Atherosclerotic heart disease of native coronary artery without angina pectoris: Secondary | ICD-10-CM | POA: Diagnosis not present

## 2015-07-20 DIAGNOSIS — M797 Fibromyalgia: Secondary | ICD-10-CM | POA: Diagnosis present

## 2015-07-20 DIAGNOSIS — E785 Hyperlipidemia, unspecified: Secondary | ICD-10-CM | POA: Diagnosis present

## 2015-07-20 DIAGNOSIS — I208 Other forms of angina pectoris: Secondary | ICD-10-CM | POA: Diagnosis present

## 2015-07-20 DIAGNOSIS — R9439 Abnormal result of other cardiovascular function study: Secondary | ICD-10-CM | POA: Insufficient documentation

## 2015-07-20 HISTORY — PX: CARDIAC CATHETERIZATION: SHX172

## 2015-07-20 HISTORY — DX: Essential (primary) hypertension: I10

## 2015-07-20 HISTORY — DX: Atherosclerotic heart disease of native coronary artery without angina pectoris: I25.10

## 2015-07-20 LAB — CBC WITH DIFFERENTIAL/PLATELET
Basophils Absolute: 0 10*3/uL (ref 0.0–0.1)
Basophils Relative: 1 %
EOS ABS: 0.2 10*3/uL (ref 0.0–0.7)
Eosinophils Relative: 3 %
HEMATOCRIT: 39.8 % (ref 36.0–46.0)
Hemoglobin: 13 g/dL (ref 12.0–15.0)
Lymphocytes Relative: 34 %
Lymphs Abs: 1.6 10*3/uL (ref 0.7–4.0)
MCH: 29 pg (ref 26.0–34.0)
MCHC: 32.7 g/dL (ref 30.0–36.0)
MCV: 88.6 fL (ref 78.0–100.0)
MONO ABS: 0.4 10*3/uL (ref 0.1–1.0)
MONOS PCT: 8 %
Neutro Abs: 2.6 10*3/uL (ref 1.7–7.7)
Neutrophils Relative %: 54 %
Platelets: 216 10*3/uL (ref 150–400)
RBC: 4.49 MIL/uL (ref 3.87–5.11)
RDW: 13.3 % (ref 11.5–15.5)
WBC: 4.8 10*3/uL (ref 4.0–10.5)

## 2015-07-20 LAB — COMPREHENSIVE METABOLIC PANEL
ALK PHOS: 61 U/L (ref 38–126)
ALT: 16 U/L (ref 14–54)
ANION GAP: 11 (ref 5–15)
AST: 21 U/L (ref 15–41)
Albumin: 3.8 g/dL (ref 3.5–5.0)
BILIRUBIN TOTAL: 0.5 mg/dL (ref 0.3–1.2)
BUN: 14 mg/dL (ref 6–20)
CALCIUM: 9.8 mg/dL (ref 8.9–10.3)
CO2: 23 mmol/L (ref 22–32)
Chloride: 109 mmol/L (ref 101–111)
Creatinine, Ser: 1 mg/dL (ref 0.44–1.00)
GFR calc non Af Amer: >60 mL/min (ref >60)
Glucose, Bld: 90 mg/dL (ref 65–99)
Potassium: 3.5 mmol/L (ref 3.5–5.1)
Sodium: 143 mmol/L (ref 135–145)
TOTAL PROTEIN: 7 g/dL (ref 6.5–8.1)

## 2015-07-20 LAB — MAGNESIUM: Magnesium: 2.2 mg/dL (ref 1.7–2.4)

## 2015-07-20 LAB — PROTIME-INR
INR: 1.09 (ref 0.00–1.49)
PROTHROMBIN TIME: 14.3 s (ref 11.6–15.2)

## 2015-07-20 LAB — TSH: TSH: 3.071 u[IU]/mL (ref 0.350–4.500)

## 2015-07-20 LAB — TROPONIN I: Troponin I: <0.03 ng/mL (ref <0.031)

## 2015-07-20 LAB — APTT: aPTT: 30 seconds (ref 24–37)

## 2015-07-20 SURGERY — LEFT HEART CATH AND CORONARY ANGIOGRAPHY
Anesthesia: LOCAL

## 2015-07-20 MED ORDER — SODIUM CHLORIDE 0.9% FLUSH
3.0000 mL | Freq: Two times a day (BID) | INTRAVENOUS | Status: DC
Start: 2015-07-20 — End: 2015-07-20

## 2015-07-20 MED ORDER — MIDAZOLAM HCL 2 MG/2ML IJ SOLN
INTRAMUSCULAR | Status: AC
  Filled 2015-07-20: qty 2

## 2015-07-20 MED ORDER — VERAPAMIL HCL 2.5 MG/ML IV SOLN
INTRAVENOUS | Status: AC
  Filled 2015-07-20: qty 2

## 2015-07-20 MED ORDER — RISAQUAD PO CAPS: 1.0000 | ORAL_CAPSULE | Freq: Every day | ORAL | Status: DC

## 2015-07-20 MED ORDER — SODIUM CHLORIDE 0.9 % WEIGHT BASED INFUSION
3.0000 mL/kg/h | INTRAVENOUS | Status: DC
Start: 2015-07-20 — End: 2015-07-20
  Administered 2015-07-20: 3 mL/kg/h via INTRAVENOUS

## 2015-07-20 MED ORDER — METOPROLOL TARTRATE 25 MG PO TABS
25.0000 mg | ORAL_TABLET | Freq: Two times a day (BID) | ORAL | Status: DC
  Administered 2015-07-20: 25 mg via ORAL
  Filled 2015-07-20: qty 1

## 2015-07-20 MED ORDER — MIDAZOLAM HCL 2 MG/2ML IJ SOLN
INTRAMUSCULAR | Status: DC | PRN
  Administered 2015-07-20 (×2): 1 mg via INTRAVENOUS

## 2015-07-20 MED ORDER — ASPIRIN EC 81 MG PO TBEC: 81.0000 mg | DELAYED_RELEASE_TABLET | Freq: Every day | ORAL | Status: DC

## 2015-07-20 MED ORDER — TOPIRAMATE 25 MG PO TABS: 50.0000 mg | ORAL_TABLET | Freq: Every day | ORAL | Status: DC

## 2015-07-20 MED ORDER — LIOTHYRONINE SODIUM 5 MCG PO TABS: 5.0000 ug | ORAL_TABLET | Freq: Every day | ORAL | Status: DC

## 2015-07-20 MED ORDER — ONDANSETRON HCL 4 MG/2ML IJ SOLN: 4.0000 mg | Freq: Four times a day (QID) | INTRAMUSCULAR | Status: DC | PRN

## 2015-07-20 MED ORDER — HEPARIN SODIUM (PORCINE) 1000 UNIT/ML IJ SOLN
INTRAMUSCULAR | Status: AC
  Filled 2015-07-20: qty 1

## 2015-07-20 MED ORDER — SODIUM CHLORIDE 0.9% FLUSH: 3.0000 mL | Freq: Two times a day (BID) | INTRAVENOUS | Status: DC

## 2015-07-20 MED ORDER — HEPARIN SODIUM (PORCINE) 1000 UNIT/ML IJ SOLN
INTRAMUSCULAR | Status: DC | PRN
  Administered 2015-07-20: 3900 [IU] via INTRAVENOUS

## 2015-07-20 MED ORDER — LIOTHYRONINE SODIUM 5 MCG PO TABS
10.0000 ug | ORAL_TABLET | Freq: Every day | ORAL | Status: DC
  Filled 2015-07-20: qty 2

## 2015-07-20 MED ORDER — ASPIRIN 81 MG PO CHEW: 81.0000 mg | CHEWABLE_TABLET | ORAL | Status: DC

## 2015-07-20 MED ORDER — SODIUM CHLORIDE 0.9 % IV SOLN: 250.0000 mL | INTRAVENOUS | Status: DC | PRN

## 2015-07-20 MED ORDER — ASPIRIN 81 MG PO CHEW
324.0000 mg | CHEWABLE_TABLET | ORAL | Status: AC
  Administered 2015-07-20: 324 mg via ORAL
  Filled 2015-07-20: qty 4

## 2015-07-20 MED ORDER — FENTANYL CITRATE (PF) 100 MCG/2ML IJ SOLN
INTRAMUSCULAR | Status: AC
  Filled 2015-07-20: qty 2

## 2015-07-20 MED ORDER — LIDOCAINE HCL (PF) 1 % IJ SOLN
INTRAMUSCULAR | Status: DC | PRN
  Administered 2015-07-20: 3 mL

## 2015-07-20 MED ORDER — OXYCODONE-ACETAMINOPHEN 5-325 MG PO TABS: 1.0000 | ORAL_TABLET | ORAL | Status: DC | PRN

## 2015-07-20 MED ORDER — SODIUM CHLORIDE 0.9 % WEIGHT BASED INFUSION: 3.0000 mL/kg/h | INTRAVENOUS | Status: DC

## 2015-07-20 MED ORDER — HEPARIN (PORCINE) IN NACL 2-0.9 UNIT/ML-% IJ SOLN
INTRAMUSCULAR | Status: AC
  Filled 2015-07-20: qty 1000

## 2015-07-20 MED ORDER — ACETAMINOPHEN 325 MG PO TABS: 650.0000 mg | ORAL_TABLET | ORAL | Status: DC | PRN

## 2015-07-20 MED ORDER — SODIUM CHLORIDE 0.9% FLUSH: 3.0000 mL | INTRAVENOUS | Status: DC | PRN

## 2015-07-20 MED ORDER — TOPIRAMATE 25 MG PO TABS
150.0000 mg | ORAL_TABLET | Freq: Every day | ORAL | Status: DC
  Filled 2015-07-20: qty 1.5

## 2015-07-20 MED ORDER — FENTANYL CITRATE (PF) 100 MCG/2ML IJ SOLN
INTRAMUSCULAR | Status: DC | PRN
  Administered 2015-07-20: 50 ug via INTRAVENOUS

## 2015-07-20 MED ORDER — IOPAMIDOL (ISOVUE-370) INJECTION 76%
INTRAVENOUS | Status: AC
  Filled 2015-07-20: qty 100

## 2015-07-20 MED ORDER — POTASSIUM CHLORIDE ER 10 MEQ PO TBCR
10.0000 meq | EXTENDED_RELEASE_TABLET | Freq: Two times a day (BID) | ORAL | Status: DC
  Filled 2015-07-20: qty 1

## 2015-07-20 MED ORDER — NITROGLYCERIN 0.4 MG SL SUBL: 0.4000 mg | SUBLINGUAL_TABLET | SUBLINGUAL | Status: DC | PRN

## 2015-07-20 MED ORDER — ATORVASTATIN CALCIUM 20 MG PO TABS: 20.0000 mg | ORAL_TABLET | Freq: Every day | ORAL | Status: DC

## 2015-07-20 MED ORDER — ASPIRIN 81 MG PO CHEW: 81.0000 mg | CHEWABLE_TABLET | Freq: Every day | ORAL | Status: DC

## 2015-07-20 MED ORDER — OMEGA-3-ACID ETHYL ESTERS 1 G PO CAPS: 1.0000 | ORAL_CAPSULE | Freq: Two times a day (BID) | ORAL | Status: DC

## 2015-07-20 MED ORDER — PROGESTERONE MICRONIZED 200 MG PO CAPS
200.0000 mg | ORAL_CAPSULE | Freq: Every day | ORAL | Status: DC
  Filled 2015-07-20: qty 1

## 2015-07-20 MED ORDER — ASPIRIN 300 MG RE SUPP: 300.0000 mg | RECTAL | Status: AC

## 2015-07-20 MED ORDER — SODIUM CHLORIDE 0.9 % IV SOLN
INTRAVENOUS | Status: DC
  Administered 2015-07-20: 11:00:00 via INTRAVENOUS

## 2015-07-20 MED ORDER — ALPRAZOLAM 0.25 MG PO TABS: 0.2500 mg | ORAL_TABLET | Freq: Two times a day (BID) | ORAL | Status: DC | PRN

## 2015-07-20 MED ORDER — HEPARIN (PORCINE) IN NACL 100-0.45 UNIT/ML-% IJ SOLN
900.0000 [IU]/h | INTRAMUSCULAR | Status: DC
  Administered 2015-07-20: 900 [IU]/h via INTRAVENOUS
  Filled 2015-07-20: qty 250

## 2015-07-20 MED ORDER — SODIUM CHLORIDE 0.9 % WEIGHT BASED INFUSION: 1.0000 mL/kg/h | INTRAVENOUS | Status: DC

## 2015-07-20 MED ORDER — IOPAMIDOL (ISOVUE-370) INJECTION 76%
INTRAVENOUS | Status: DC | PRN
  Administered 2015-07-20: 90 mL via INTRAVENOUS

## 2015-07-20 MED ORDER — HEPARIN BOLUS VIA INFUSION
4000.0000 [IU] | Freq: Once | INTRAVENOUS | Status: AC
  Administered 2015-07-20: 4000 [IU] via INTRAVENOUS
  Filled 2015-07-20: qty 4000

## 2015-07-20 MED ORDER — VERAPAMIL HCL 2.5 MG/ML IV SOLN
INTRAVENOUS | Status: DC | PRN
  Administered 2015-07-20: 16:00:00 via INTRA_ARTERIAL

## 2015-07-20 MED ORDER — ADULT MULTIVITAMIN W/MINERALS CH
1.0000 | ORAL_TABLET | Freq: Every day | ORAL | Status: DC
Start: 2015-07-21 — End: 2015-07-20

## 2015-07-20 MED ORDER — TOPIRAMATE 100 MG PO TABS: ORAL_TABLET | ORAL | Status: DC

## 2015-07-20 MED ORDER — LIDOCAINE HCL (PF) 1 % IJ SOLN
INTRAMUSCULAR | Status: AC
  Filled 2015-07-20: qty 30

## 2015-07-20 MED ORDER — PROBIOTIC & ACIDOPHILUS EX ST PO CAPS: 1.0000 | ORAL_CAPSULE | Freq: Every day | ORAL | Status: DC

## 2015-07-20 MED ORDER — ASPIRIN 81 MG PO TBEC: 81.0000 mg | DELAYED_RELEASE_TABLET | Freq: Every day | ORAL | Status: DC

## 2015-07-20 SURGICAL SUPPLY — 11 items
CATH INFINITI 5 FR JL3.5 (CATHETERS) ×2 IMPLANT
CATH INFINITI JR4 5F (CATHETERS) ×2 IMPLANT
CATH LAUNCHER 5F EBU3.0 (CATHETERS) ×1 IMPLANT
CATHETER LAUNCHER 5F EBU3.0 (CATHETERS) ×2
DEVICE RAD COMP TR BAND LRG (VASCULAR PRODUCTS) ×2 IMPLANT
GLIDESHEATH SLEND A-KIT 6F 22G (SHEATH) ×2 IMPLANT
KIT HEART LEFT (KITS) ×2 IMPLANT
PACK CARDIAC CATHETERIZATION (CUSTOM PROCEDURE TRAY) ×2 IMPLANT
TRANSDUCER W/STOPCOCK (MISCELLANEOUS) ×2 IMPLANT
TUBING CIL FLEX 10 FLL-RA (TUBING) ×2 IMPLANT
WIRE SAFE-T 1.5MM-J .035X260CM (WIRE) ×2 IMPLANT

## 2015-07-20 NOTE — Telephone Encounter (Signed)
Called both home and cell #s but no answer. Left VM mssg for pt to call back and reschedule appt.

## 2015-07-20 NOTE — Discharge Summary (Signed)
Discharge Summary    Patient ID: Maria Becker,  MRN: [id number], DOB/AGE: June 04, 1960 55 y.o.  Admit date: 07/20/2015 Discharge date: 07/21/2015  Primary Care Provider: Tomi Bamberger Primary Cardiologist: Dr. Katrinka Blazing  Discharge Diagnoses    Principal Problem:   Angina decubitus [facility]) Active Problems:   CAD (coronary artery disease), native coronary artery, LAD on cardiac CT   HTN (hypertension)   Family history of premature CAD   Hyperlipidemia LDL goal <100   Angina at rest [facility])   Abnormal nuclear stress test   History of Present Illness     Maria Becker is a 55 y.o. female with past medical history of HTN, Fibromyalgia, and GERD who presented to the office on 07/20/2015 for results concerning an abnormal nuclear study.   Her stress test was performed on 07/19/2015 and showed a medium size, moderate intensity reversible anterior and anteroseptal perfusion defect suggestive of ischemia, or less likely shifting breast artifact. It was overall an intermediate risk study.   On arrival to the office, she reported having chest discomfort.An EKG was obtained which showed no acute ischemic changes. With her continued symptoms and abnormal stress test, she was admitted to [facility] for a cardiac catheterization. The risks and benefits were thoroughly reviewed with the patient and she agreed to proceed. She was started on IV Heparin prior to her cath.  Hospital Course     Consultants: None  Her cardiac catheterization showed 30-50% mid-LAD stenosis just beyond the origin of the second diagonal but otherwise normal coronary arteries were noted. LV function was normal. Therefore, no further cardiac evaluation for ischemia was indicated.  Following the procedure, she denied any repeat episodes of chest discomfort. Her right radial cath site was stable without evidence of a hematoma or bruit. Her troponin values were negative. She ambulated around the unit without any repeat  symptoms.  She was discharged home in stable condition. She wishes to follow-up with Dr. Katrinka Blazing on an as needed basis following her catheterization results today. _____________  Discharge Vitals Blood pressure 116/69, pulse 50, temperature 98.9 F (37.2 C), temperature source Oral, resp. rate 12, height  (1.651 m), weight 170 lb 6.4 oz (77.293 kg), SpO2 97 %.  Filed Weights   07/20/15 1029  Weight: 170 lb 6.4 oz (77.293 kg)    Labs & Radiologic Studies     CBC  Recent Labs  07/20/15 1046  WBC 4.8  NEUTROABS 2.6  HGB 13.0  HCT 39.8  MCV 88.6  PLT 216   Basic Metabolic Panel  Recent Labs  07/20/15 1046  NA 143  K 3.5  CL 109  CO2 23  GLUCOSE 90  BUN 14  CREATININE 1.00  CALCIUM 9.8  MG 2.2   Liver Function Tests  Recent Labs  07/20/15 1046  AST 21  ALT 16  ALKPHOS 61  BILITOT 0.5  PROT 7.0  ALBUMIN 3.8   No results for input(s): LIPASE, AMYLASE in the last 72 hours. Cardiac Enzymes  Recent Labs  07/20/15 1046 07/20/15 1658  TROPONINI <0.03 <0.03  Thyroid Function Tests  Recent Labs  07/20/15 1046  TSH 3.071    Portable Chest X-ray 1 View: 07/20/2015  CLINICAL DATA:  Chest pain and tightness with activity, abnormal stress test. EXAM: PORTABLE CHEST 1 VIEW COMPARISON:  11/05/2006 FINDINGS: The heart size and mediastinal contours are within normal limits. Both lungs are clear. The visualized skeletal structures are unremarkable. IMPRESSION: No active disease. Electronically Signed   By:  Elberta Fortisaniel  Boyle M.D.   On: 07/20/2015 11:17     Diagnostic Studies/Procedures     Cardiac Catheterization: 07/20/2015    1. Mid LAD lesion, 50% stenosed.   Focal eccentric 30-50% mid LAD stenosis just beyond the origin of the second diagonal. Coronaries otherwise normal. No calcification is noted. No luminal irregularities are noted.  Normal left ventricular function  False-positive myocardial perfusion study  Recommendations:   No further  cardiac evaluation for ischemia.   Disposition   Pt is being discharged home today in good condition.  Follow-up Plans & Appointments    Follow-up Information    Follow up with Lesleigh NoeSMITH III,HENRY W, MD.   Specialty:  Cardiology   Why:  As needed   Contact information:   [address] [place] [address] [phone]      Discharge Instructions    Diet - low sodium heart healthy    Complete by:  As directed      Discharge instructions    Complete by:  As directed   PLEASE REMEMBER TO BRING ALL OF YOUR MEDICATIONS TO EACH OF YOUR FOLLOW-UP OFFICE VISITS.  PLEASE ATTEND ALL SCHEDULED FOLLOW-UP APPOINTMENTS.   Activity: Increase activity slowly as tolerated. You may shower, but no soaking baths (or swimming) for 1 week. No driving for 24 hours. No lifting over 5 lbs for 1 week. No sexual activity for 1 week.   You May Return to Work: on 07/26/2015  Wound Care: You may wash cath site gently with soap and water. Keep cath site clean and dry. If you notice pain, swelling, bleeding or pus at your cath site, please call [phone].     Increase activity slowly    Complete by:  As directed            Discharge Medications   Discharge Medication List as of 07/20/2015  7:47 PM    START taking these medications   Details  aspirin EC 81 MG EC tablet Take 1 tablet (81 mg total) by mouth daily., Starting 07/21/2015, Until Discontinued, No Print      CONTINUE these medications which have NOT CHANGED   Details  b complex vitamins tablet Take 1 tablet by mouth daily., Until Discontinued, Historical Med    Cholecalciferol ([employer]S VIT D 5000 HIGH-POTENCY PO) Take 1 tablet by mouth daily. , Until Discontinued, Historical Med    Cyanocobalamin (VITAMIN B 12 PO) Take 2,500 mcg by mouth daily., Until Discontinued, Historical Med    KLOR-CON 10 10 MEQ tablet Take 10 mg by mouth at bedtime. , Starting 06/07/2015, Until Discontinued, Historical Med    liothyronine (CYTOMEL)  5 MCG tablet Take 10 mcg by mouth daily before breakfast. , Starting 06/07/2015, Until Discontinued, Historical Med    Multiple Vitamins-Minerals (MULTIVITAMIN WITH MINERALS) tablet Take 1 tablet by mouth daily., Until Discontinued, Historical Med    NON FORMULARY Take 500 mg by mouth daily. SERRAPEPTASE, Until Discontinued, Historical Med    Omega-3 Fatty Acids (OMEGA-3 FISH OIL PO) Take 1 capsule by mouth 2 (two) times daily. OMEGA PURE 820, Until Discontinued, Historical Med    Probiotic Product (PROBIOTIC & ACIDOPHILUS EX ST PO) Take 1 tablet by mouth daily., Until Discontinued, Historical Med    progesterone (PROMETRIUM) 200 MG capsule Take 200 mg by mouth daily., Until Discontinued, Historical Med    topiramate (TOPAMAX) 100 MG tablet One half tablet in the morning, 1.5 tablets in the evening, Normal  Allergies Allergies  Allergen Reactions  . Gluten Meal      Outstanding Labs/Studies   None  Duration of Discharge Encounter   Greater than 30 minutes including physician time.  Signed, Ellsworth Lennox, PA-C 07/21/2015, 6:53 AM  Cardiology Attending  Agree with above. Usual followup.  Allegra Grana.D.

## 2015-07-20 NOTE — Patient Instructions (Signed)
    [facility]  Please report to Admitting and they will direct you to your room.  Your follow-up appointment will be set up at discharge.  Hope you get to feeling better soon!  Daisey MustGood Luck!

## 2015-07-20 NOTE — Telephone Encounter (Addendum)
Apparently this denial was on the basis of the number of tablets. I will convert the patient to the 100 mg tablets, and have her take one half in the morning, 1.5 tablets in the evening.

## 2015-07-20 NOTE — Progress Notes (Signed)
Cardiology Office Note   Date:  07/20/2015   ID:  Maria Becker, DOB May 02, 1960, MRN [id number]  PCP:  Tomi BambergerFULLER,SUSAN, NP  Cardiologist:  Dr. Katrinka BlazingSmith    Chief Complaint  Patient presents with  . Coronary Artery Disease    abnormal stress test and chest pain      History of Present Illness: Maria Becker is a 55 y.o. female who presents for abnormal nuc study with complaints of .chest tightness with activity.  She also has episodes of staring and loss of consciousness.    She has a history of a cardiac CT in 2011 that showed a 50% soft plaque in the proximal LAD, leading to a heart catheretization ? Though now she believes that was cerebral angiogram.  I could not find cath report in computer. . Her other PMH is notable for family h/o CAD (mother with MI in her 6850s, brother died from massive MI at age 57), HTN and untreated HLD. No personal h/o DM or tobacco use.   to our practice last month for palpitations and odd spells of staring and loss of consciousness associated with twitching motor activity. She was seen by Dr. Katrinka BlazingSmith who ordered a 30 day monitor to assess for arrhthymias and a 2D echo to assess for structural heart disease. Both studies were normal/ unremarkable. There is concern given her family h/o CAD/ MI in multiple first degree releatives, evidence of plaque in LAD system in 2006 and recent symptoms of chest tightness, along with her other complaints. For this reason, we will order an exercise NST to r/o the possibility of ischemia. If normal, will not plan for further cardiac w/u. ? Neurogenic syncope.    Her nuc study was abnormal with The left ventricular ejection fraction is normal (55-65%).  Nuclear stress EF: 55%.  There was no ST segment deviation noted during stress.  No T wave inversion was noted during stress.  Defect 1: There is a medium defect of moderate severity.  Findings consistent with ischemia.  This is an intermediate risk study.  Medium  size, moderate intensity reversible anterior and anteroseptal perfusion defect suggestive of ischemia, or less likely shifting breast artifact. Poor counts noted in the rest images. LVEF 55% with normal wall motion. Fair exercise tolerance without anginal symptoms during exercise. This is an intermediate risk study. Clinical correlation is recommended.   Previous echo with:  Studies have been reviewed. 2D echo showed normal LVEF of 55-60%. Wall motion was normal w/o abnormalities. There were no significant valvular abnormalities. Only mild MR was noted. No pericardial effusion. 30 day monitor was unrevealing of any significant abnormalities. She maintained SR. HR ranged from 47-114 bpm. Average HR was 68 bpm.   On arrival to the office she was having chest discomfort.  She did not rest well last night with discomfort.   After EKG she did feel better and no changes on EKG.     Past Medical History  Diagnosis Date  . Fibromyalgia   . Hypertension   . GERD (gastroesophageal reflux disease)   . Headache   . Hyperlipidemia   . 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 Abdominal hysterectomy    . Back surgery    . L4-l5 fusion       Current Outpatient Prescriptions  Medication Sig Dispense Refill  . b complex vitamins tablet Take 1 tablet by mouth daily.    . Cholecalciferol ([facility] VIT D 5000 HIGH-POTENCY  PO) Take 1 tablet by mouth daily.     . Cyanocobalamin (VITAMIN B 12 PO) Take 2,500 mcg by mouth daily.    Marland Kitchen KLOR-CON 10 10 MEQ tablet Take 10 mg by mouth 2 (two) times daily.    Marland Kitchen liothyronine (CYTOMEL) 5 MCG tablet Take 5 mcg by mouth daily before breakfast.    . Multiple Vitamins-Minerals (MULTIVITAMIN WITH MINERALS) tablet Take 1 tablet by mouth daily.    . NON FORMULARY Take 500 mg by mouth daily. SERRAPEPTASE    . NON FORMULARY Take 1 tablet by mouth daily. ANNATTO TOCOTRIENOLS FOR CARDIAC    . Omega-3 Fatty Acids (OMEGA-3 FISH OIL  PO) Take 1 capsule by mouth 2 (two) times daily. OMEGA PURE 820    . Probiotic Product (PROBIOTIC & ACIDOPHILUS EX ST PO) Take 1 tablet by mouth daily.    . progesterone (PROMETRIUM) 200 MG capsule Take 200 mg by mouth daily.    . TOPAMAX 25 MG tablet Take 8 tablets (200 mg total) by mouth daily. May take 50 mg in the morning and 150 mg at bedtime. 240 tablet 11   No current facility-administered medications for this visit.   Facility-Administered Medications Ordered in Other Visits  Medication Dose Route Frequency Provider Last Rate Last Dose  . gadopentetate dimeglumine (MAGNEVIST) injection 20 mL  20 mL Intravenous Once PRN York Spaniel, MD        Allergies:   Review of patient's allergies indicates no known allergies.    Social History:  The patient  reports that she has never smoked. She has never used smokeless tobacco. She reports that she drinks alcohol. She reports that she does not use illicit drugs.   Family History:  The patient's family history includes Aneurysm in her cousin, maternal uncle, and mother; COPD in her mother; Cancer - Colon in her maternal aunt; Diabetes in her brother, father, maternal aunt, maternal grandfather, maternal grandmother, mother, paternal grandfather, paternal grandmother, and sister; Heart attack in her brother; Heart disease in her maternal grandfather, maternal grandmother, mother, paternal grandfather, and paternal grandmother; Hypothyroidism in her paternal aunt; Kidney disease in her brother and maternal aunt; Melanoma in her father.    ROS:  General:no colds or fevers, no weight changes Skin:no rashes or ulcers HEENT:no blurred vision, no congestion CV:see HPI PUL:see HPI GI:no diarrhea constipation or melena, no indigestion GU:no hematuria, no dysuria MS:no joint pain, no claudication Neuro:no syncope, no lightheadedness, hx migraines and syncope Endo:no diabetes, no thyroid disease  Wt Readings from Last 3 Encounters:  07/20/15  173 lb 1.9 oz (78.527 kg)  07/19/15 180 lb (81.647 kg)  06/08/15 180 lb (81.647 kg)     PHYSICAL EXAM: VS:  BP 110/70 mmHg  Pulse 80  Ht 5\' 5"  (1.651 m)  Wt 173 lb 1.9 oz (78.527 kg)  BMI 28.81 kg/m2 , BMI Body mass index is 28.81 kg/(m^2). General:Pleasant affect, NAD Skin:Warm and dry, brisk capillary refill HEENT:normocephalic, sclera clear, mucus membranes moist Neck:supple, no JVD, no bruits  Heart:S1S2 RRR without murmur, gallup, rub or click Lungs:clear without rales, rhonchi, or wheezes AVW:UJWJ, non tender, + BS, do not palpate liver spleen or masses Ext:no lower ext edema, 2+ pedal pulses, 2+ radial pulses Neuro:alert and oriented, MAE, follows commands, + facial symmetry    EKG:  EKG is ordered today. The ekg ordered today demonstrates SR with no acute changes   Recent Labs: 02/01/2015: BUN 13; Creatinine, Ser 0.84; Potassium 4.4; Sodium 143    Lipid Panel  Component Value Date/Time   CHOL * 02/28/2007 1059    237        ATP III CLASSIFICATION:  <200     mg/dL   Desirable  540-981  mg/dL   Borderline High  >=191    mg/dL   High   TRIG 37 47/82/9562 1059   HDL 71 02/28/2007 1059   CHOLHDL 3.3 02/28/2007 1059   VLDL 7 02/28/2007 1059   LDLCALC * 02/28/2007 1059    159        Total Cholesterol/HDL:CHD Risk Coronary Heart Disease Risk Table                     Men   Women  1/2 Average Risk   3.4   3.3       Other studies Reviewed: Additional studies/ records that were reviewed today include: . Event monitor with SR HR 47-111 with average HR of 68 no arrthymias.  ECHO: Study Conclusions  - Left ventricle: The cavity size was normal. Wall thickness was  normal. Systolic function was normal. The estimated ejection  fraction was in the range of 55% to 60%. Wall motion was normal;  there were no regional wall motion abnormalities. Doppler  parameters are consistent with abnormal left ventricular  relaxation (grade 1 diastolic  dysfunction). - Mitral valve: There was mild regurgitation.  Impressions:  - Normal LV systolic function; grade 1 diastolic function; mild MR,  trace TR.  ASSESSMENT AND PLAN:  1.  Angina did not feel well last night, and with + nuc study at same area of previous soft plague in LAD area of ischemia in addition to strong family hx of CAD will admit and plan cardiac cath for today if labs stable.   Admit, have stat labs including troponin and plan for NPO for cardiac cath today if labs are normal.  Will add IV heparin for now and asa .  2.  Hx of CAD on Nuc study and now abnormal nuc- discussed cardiac cath with pt.    The patient understands that risks included but are not limited to stroke (1 in 1000), death (1 in 1000), kidney failure [usually temporary] (1 in 500), bleeding (1 in 200), allergic reaction [possibly serious] (1 in 200).    3. Essential HTN controlled  4. Hyperlipidemia  Though no labs recently.will check in Am.   5. Hx of syncope no arrthymias on event monitor, MRI of brain without issues.  Hx of Migraines.   Dr. Ladona Ridgel has seen and agrees.     Current medicines are reviewed with the patient today.  The patient Has no concerns regarding medicines.  The following changes have been made:  See above Labs/ tests ordered today include:see above  Disposition:   FU:  see above  Signed, Leone Brand, NP  07/20/2015 9:56 AM    [facility] [address] [place], [place], [place]  [address]/ [address] [place], [place] Phone: [phone]; Fax: [phone]  [phone]

## 2015-07-20 NOTE — Progress Notes (Signed)
Reviewed discharge instructions and AVS with patient and family at bedside. Post-cardiac catheterization right wrist site care reviewed with patient and family. All questions answered to patient/family satisfaction. Left antecubital 22g IV removed and dressing placed. Telemetry removed and CCMD notified of patient's discharge. Patient discharged with family to private home; ambulated off unit with family.

## 2015-07-20 NOTE — Research (Signed)
[place] Study Informed Consent   Subject Name: Maria Becker  Subject met inclusion and exclusion criteria.  The informed consent form, study requirements and expectations were reviewed with the subject and questions and concerns were addressed prior to the signing of the consent form.  The subject verbalized understanding of the trial requirements.  The subject agreed to participate in the [place] trial and signed the informed consent at 1355 on 07/20/2015.  The informed consent was obtained prior to performance of any protocol-specific procedures for the subject.  A copy of the signed informed consent was given to the subject and a copy was placed in the subject's medical record.  Blossom Hoops 07/20/2015, 2:33 PM

## 2015-07-20 NOTE — H&P (Signed)
Expand All Collapse All      HISTORY AND PHYSICAL    Date: 07/20/2015   ID: Maria Becker, DOB May 02, 1960, MRN [id number]  PCP: Tomi Bamberger, NP Cardiologist: Dr. Katrinka Blazing   Chief Complaint  Patient presents with  . Coronary Artery Disease    abnormal stress test and chest pain     History of Present Illness: Maria Becker is a 55 y.o. female who presents for abnormal nuc study with complaints of .chest tightness with activity. She also has episodes of staring and loss of consciousness.   She has a history of a cardiac CT in 2011 that showed a 50% soft plaque in the proximal LAD, leading to a heart catheretization ? Though now she believes that was cerebral angiogram. I could not find cath report in computer. . Her other PMH is notable for family h/o CAD (mother with MI in her 87s, brother died from massive MI at age 36), HTN and untreated HLD. No personal h/o DM or tobacco use.   to our practice last month for palpitations and odd spells of staring and loss of consciousness associated with twitching motor activity. She was seen by Dr. Katrinka Blazing who ordered a 30 day monitor to assess for arrhthymias and a 2D echo to assess for structural heart disease. Both studies were normal/ unremarkable. There is concern given her family h/o CAD/ MI in multiple first degree releatives, evidence of plaque in LAD system in 2006 and recent symptoms of chest tightness, along with her other complaints. For this reason, we will order an exercise NST to r/o the possibility of ischemia. If normal, will not plan for further cardiac w/u. ? Neurogenic syncope.    Her nuc study was abnormal with The left ventricular ejection fraction is normal (55-65%).  Nuclear stress EF: 55%.  There was no ST segment deviation noted during stress.  No T wave inversion was noted during stress.  Defect 1: There is a medium defect of moderate severity.  Findings consistent with ischemia.  This is  an intermediate risk study.  Medium size, moderate intensity reversible anterior and anteroseptal perfusion defect suggestive of ischemia, or less likely shifting breast artifact. Poor counts noted in the rest images. LVEF 55% with normal wall motion. Fair exercise tolerance without anginal symptoms during exercise. This is an intermediate risk study. Clinical correlation is recommended.   Previous echo with: Studies have been reviewed. 2D echo showed normal LVEF of 55-60%. Wall motion was normal w/o abnormalities. There were no significant valvular abnormalities. Only mild MR was noted. No pericardial effusion. 30 day monitor was unrevealing of any significant abnormalities. She maintained SR. HR ranged from 47-114 bpm. Average HR was 68 bpm.   On arrival to the office she was having chest discomfort. She did not rest well last night with discomfort. After EKG she did feel better and no changes on EKG.    Past Medical History  Diagnosis Date  . Fibromyalgia   . Hypertension   . GERD (gastroesophageal reflux disease)   . Headache   . Hyperlipidemia   . 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 Abdominal hysterectomy    . Back surgery    . L4-l5 fusion       Current Outpatient Prescriptions  Medication Sig Dispense Refill  . b complex vitamins tablet Take 1 tablet by mouth daily.    . Cholecalciferol ([facility]S VIT D 5000 HIGH-POTENCY PO) Take 1 tablet by mouth  daily.     . Cyanocobalamin (VITAMIN B 12 PO) Take 2,500 mcg by mouth daily.    Marland Kitchen. KLOR-CON 10 10 MEQ tablet Take 10 mg by mouth 2 (two) times daily.    Marland Kitchen. liothyronine (CYTOMEL) 5 MCG tablet Take 5 mcg by mouth daily before breakfast.    . Multiple Vitamins-Minerals (MULTIVITAMIN WITH MINERALS) tablet Take 1 tablet by mouth daily.    . NON FORMULARY Take 500 mg by mouth daily. SERRAPEPTASE      . NON FORMULARY Take 1 tablet by mouth daily. ANNATTO TOCOTRIENOLS FOR CARDIAC    . Omega-3 Fatty Acids (OMEGA-3 FISH OIL PO) Take 1 capsule by mouth 2 (two) times daily. OMEGA PURE 820    . Probiotic Product (PROBIOTIC & ACIDOPHILUS EX ST PO) Take 1 tablet by mouth daily.    . progesterone (PROMETRIUM) 200 MG capsule Take 200 mg by mouth daily.    . TOPAMAX 25 MG tablet Take 8 tablets (200 mg total) by mouth daily. May take 50 mg in the morning and 150 mg at bedtime. 240 tablet 11   No current facility-administered medications for this visit.   Facility-Administered Medications Ordered in Other Visits  Medication Dose Route Frequency Provider Last Rate Last Dose  . gadopentetate dimeglumine (MAGNEVIST) injection 20 mL 20 mL Intravenous Once PRN York Spanielharles K Willis, MD      Allergies: Review of patient's allergies indicates no known allergies.    Social History: The patient  reports that she has never smoked. She has never used smokeless tobacco. She reports that she drinks alcohol. She reports that she does not use illicit drugs.   Family History: The patient's family history includes Aneurysm in her cousin, maternal uncle, and mother; COPD in her mother; Cancer - Colon in her maternal aunt; Diabetes in her brother, father, maternal aunt, maternal grandfather, maternal grandmother, mother, paternal grandfather, paternal grandmother, and sister; Heart attack in her brother; Heart disease in her maternal grandfather, maternal grandmother, mother, paternal grandfather, and paternal grandmother; Hypothyroidism in her paternal aunt; Kidney disease in her brother and maternal aunt; Melanoma in her father.    ROS: General:no colds or fevers, no weight changes Skin:no rashes or ulcers HEENT:no blurred vision, no congestion CV:see HPI PUL:see HPI GI:no diarrhea constipation or melena, no indigestion GU:no hematuria, no dysuria MS:no joint pain,  no claudication Neuro:no syncope, no lightheadedness, hx migraines and syncope Endo:no diabetes, no thyroid disease  Wt Readings from Last 3 Encounters:  07/20/15 173 lb 1.9 oz (78.527 kg)  07/19/15 180 lb (81.647 kg)  06/08/15 180 lb (81.647 kg)     PHYSICAL EXAM: VS: BP 110/70 mmHg  Pulse 80  Ht 5\' 5"  (1.651 m)  Wt 173 lb 1.9 oz (78.527 kg)  BMI 28.81 kg/m2 , BMI Body mass index is 28.81 kg/(m^2). General:Pleasant affect, NAD Skin:Warm and dry, brisk capillary refill HEENT:normocephalic, sclera clear, mucus membranes moist Neck:supple, no JVD, no bruits  Heart:S1S2 RRR without murmur, gallup, rub or click Lungs:clear without rales, rhonchi, or wheezes WUJ:WJXBAbd:soft, non tender, + BS, do not palpate liver spleen or masses Ext:no lower ext edema, 2+ pedal pulses, 2+ radial pulses Neuro:alert and oriented, MAE, follows commands, + facial symmetry    EKG: EKG is ordered today. The ekg ordered today demonstrates SR with no acute changes   Recent Labs: 02/01/2015: BUN 13; Creatinine, Ser 0.84; Potassium 4.4; Sodium 143    Lipid Panel  Labs (Brief)       Component Value Date/Time   CHOL *  02/28/2007 1059    237  ATP III CLASSIFICATION: <200 mg/dL Desirable 409-811 mg/dL Borderline High >=914 mg/dL High   TRIG 37 78/29/5621 1059   HDL 71 02/28/2007 1059   CHOLHDL 3.3 02/28/2007 1059   VLDL 7 02/28/2007 1059   LDLCALC * 02/28/2007 1059    159  Total Cholesterol/HDL:CHD Risk Coronary Heart Disease Risk Table  Men Women 1/2 Average Risk 3.4 3.3        Other studies Reviewed: Additional studies/ records that were reviewed today include: . Event monitor with SR HR 47-111 with average HR of 68 no arrthymias.  ECHO: Study Conclusions  - Left ventricle: The cavity size was normal. Wall thickness was  normal. Systolic function was normal. The estimated  ejection  fraction was in the range of 55% to 60%. Wall motion was normal;  there were no regional wall motion abnormalities. Doppler  parameters are consistent with abnormal left ventricular  relaxation (grade 1 diastolic dysfunction). - Mitral valve: There was mild regurgitation.  Impressions:  - Normal LV systolic function; grade 1 diastolic function; mild MR,  trace TR.  ASSESSMENT AND PLAN:  1. Angina did not feel well last night, and with + nuc study at same area of previous soft plague in LAD area of ischemia in addition to strong family hx of CAD will admit and plan cardiac cath for today if labs stable.   Admit, have stat labs including troponin and plan for NPO for cardiac cath today if labs are normal. Will add IV heparin for now and asa .  2. Hx of CAD on Nuc study and now abnormal nuc- discussed cardiac cath with pt.   The patient understands that risks included but are not limited to stroke (1 in 1000), death (1 in 1000), kidney failure [usually temporary] (1 in 500), bleeding (1 in 200), allergic reaction [possibly serious] (1 in 200).    3. Essential HTN controlled  4. Hyperlipidemia Though no labs recently.will check in Am.   5. Hx of syncope no arrthymias on event monitor, MRI of brain without issues. Hx of Migraines.   Dr. Ladona Ridgel has seen and agrees.    Current medicines are reviewed with the patient today. The patient Has no concerns regarding medicines.  The following changes have been made: See above Labs/ tests ordered today include:see above  Disposition: FU: see above  Signed, Leone Brand, NP  07/20/2015 9:56 AM  [facility] [address] [place], [place], [place] [address]/ [address] [place], [place] Phone: [phone]; Fax: [phone]  [phone]     Cardiology Attending  Patient seen and examined. I have reviewed the findings by Nada Boozer, NP and concur. Exam reveals  a RRR and clear lungs and trace edema. She has a very strong family history. The patient has had recurrent chest pressure despite medical therapy and a positive stress test and will be admitted for left heart cath.   Leonia Reeves.D.

## 2015-07-20 NOTE — Telephone Encounter (Addendum)
Unable to reach pt to reschedule appt. Left mssg to return call.  Received fax from pt's pharmacy ([facility] [address]) says that plan limits 25 mg to 3 tabs daily. PA is required for 8 tabs daily.

## 2015-07-20 NOTE — Interval H&P Note (Signed)
Cath Lab Visit (complete for each Cath Lab visit)  Clinical Evaluation Leading to the Procedure:   ACS: No.  Non-ACS:    Anginal Classification: CCS II  Anti-ischemic medical therapy: Minimal Therapy (1 class of medications)  Non-Invasive Test Results: Intermediate-risk stress test findings: cardiac mortality 1-3%/year  Prior CABG: No previous CABG      History and Physical Interval Note:  07/20/2015 4:03 PM  Maria Becker  has presented today for surgery, with the diagnosis of Chest Pain, Abnormal Stress Test  The various methods of treatment have been discussed with the patient and family. After consideration of risks, benefits and other options for treatment, the patient has consented to  Procedure(s): Left Heart Cath and Coronary Angiography (N/A) as a surgical intervention .  The patient's history has been reviewed, patient examined, no change in status, stable for surgery.  I have reviewed the patient's chart and labs.  Questions were answered to the patient's satisfaction.     Lesleigh NoeSMITH III,[place] W

## 2015-07-20 NOTE — Addendum Note (Signed)
Addended by: Stephanie AcreWILLIS, Renessa Wellnitz on: 07/20/2015 05:18 PM   Modules accepted: Orders, Medications

## 2015-07-20 NOTE — Progress Notes (Signed)
ANTICOAGULATION CONSULT NOTE - Initial Consult  Pharmacy Consult for heparin  Indication: r/o ACS  No Known Allergies  Patient Measurements: Height: 5\' 5"  (165.1 cm) Weight: 170 lb 6.4 oz (77.293 kg) IBW/kg (Calculated) : 57 Heparin Dosing Weight: 73 kg  Vital Signs: Temp: 97.8 F (36.6 C) (05/09 1029) Temp Source: Oral (05/09 1029) BP: 113/72 mmHg (05/09 1029) Pulse Rate: 70 (05/09 1029)  Labs: No results for input(s): HGB, HCT, PLT, APTT, LABPROT, INR, HEPARINUNFRC, HEPRLOWMOCWT, CREATININE, CKTOTAL, CKMB, TROPONINI in the last 72 hours.  CrCl cannot be calculated (Patient has no serum creatinine result on file.).   Medical History: Past Medical History  Diagnosis Date  . Fibromyalgia   . Hypertension   . GERD (gastroesophageal reflux disease)   . Headache   . Hyperlipidemia   . 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 Plan for LHC this afternoon. She is not on anticoagulation PTA, baseline CBC pending.   Goal of Therapy:  Heparin level 0.3-0.7 units/ml Monitor platelets by anticoagulation protocol: Yes   Plan:  - Heparin bolus 4000 units - Heparin infusion 900 units/hr - f/u 6 hr heparin level at 1700 or f/u after cath - daily heparin level and CBC if continue on heparin after cath  Bayard HuggerMei Brad Lieurance, PharmD, BCPS  Clinical Pharmacist  Pager: [phone]   07/20/2015,10:37 AM

## 2015-07-20 NOTE — Telephone Encounter (Signed)
See phone note. Pt may call back to reschedule.

## 2015-07-21 ENCOUNTER — Encounter (HOSPITAL_COMMUNITY): Payer: Self-pay | Admitting: Student

## 2015-07-21 LAB — HEMOGLOBIN A1C
Hgb A1c MFr Bld: 5.6 % (ref 4.8–5.6)
MEAN PLASMA GLUCOSE: 114 mg/dL

## 2015-07-21 MED FILL — Heparin Sodium (Porcine) 2 Unit/ML in Sodium Chloride 0.9%: INTRAMUSCULAR | Qty: 500 | Status: AC

## 2015-07-23 NOTE — Telephone Encounter (Signed)
Patient is calling because she says topiramate (TOPAMAX) 100 MG tablet is not covered by her [employer] and the patient has no more medication. Please call to advise.

## 2015-07-23 NOTE — Telephone Encounter (Signed)
Called pt back and offered to get order from MD for gabapentin or [facility]proex [facility] Pt says that she had cardiac cath earlier this week. Would like to stick w/ medication that she has been taking since she knows it works for her w/o side effects. Talked to pharmacy again and they could do a short-fill if pt is willing to pay out-of-pocket while waiting 24-72 hrs for PA.

## 2015-07-23 NOTE — Telephone Encounter (Signed)
Spoke to pt and she will get short-fill of medication from [facility] until PA is complete.

## 2015-07-23 NOTE — Telephone Encounter (Signed)
Prior authorization for Topamax 25 mg previously denied d/t quantity limit. Rx changed to 100 mg to be within qty limit. However, rx for 100 mg was also rejected. Called and spoke to [facility]ist [facility]. He tried both 100 mg and 200 mg tabs which were both denied by insurance and neither activated PA just a rejection mssg. Called [employer] pharmacy help desk and started PA for 100 mg over the phone. Generic was ineffective and pt had increased side effects but had excellent control of HAs w/ brand medication. Also faxed letter from Dr. Anne HahnWillis explaining that generic med was ineffective with increased side effects.

## 2015-07-26 ENCOUNTER — Ambulatory Visit (INDEPENDENT_AMBULATORY_CARE_PROVIDER_SITE_OTHER): Payer: 59 | Admitting: Neurology

## 2015-07-26 ENCOUNTER — Encounter: Payer: Self-pay | Admitting: Neurology

## 2015-07-26 VITALS — BP 106/74 | HR 76 | Ht 65.0 in | Wt 170.5 lb

## 2015-07-26 DIAGNOSIS — G43109 Migraine with aura, not intractable, without status migrainosus: Secondary | ICD-10-CM | POA: Diagnosis not present

## 2015-07-26 DIAGNOSIS — R55 Syncope and collapse: Secondary | ICD-10-CM | POA: Diagnosis not present

## 2015-07-26 NOTE — Patient Instructions (Signed)

## 2015-07-26 NOTE — Progress Notes (Signed)
Reason for visit: Basilar migraine  Maria LoganDonna C Becker is an 55 y.o. 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 The patient has also had episodes of syncope. The patient indicates that if she has any bright light exposure from the left side she may have an episode of sudden syncope preceded briefly by a cold sensation in the hands. The patient last had an event 5 weeks ago. The patient has undergone a cardiac workup with a stress test and then a coronary artery catheterization that was unremarkable. The patient indicates that after the blackouts she has the sensation of being somewhat cognitively slow, and she will sleep for 2 or 3 hours afterwards. The patient has been on Topamax taking a total of 200 mg daily. Her insurance company now requires prior authorization for the brand name medication. The patient could not tolerate the generic medication of topiramate previously. The patient had been doing quite well with her headaches on the Topamax. She has not had any headaches over the last 5 weeks, but she had to lower the dose on the Topamax recently because she was running out of medications, and her headaches have returned over the last 5 days. She comes to this office for an evaluation.  Past Medical History  Diagnosis Date  . Fibromyalgia   . Hypertension   . GERD (gastroesophageal reflux disease)   . Headache   . Hyperlipidemia     a. not currently on statin therapy  . 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 CAD (coronary artery disease)     a. cath 07/2015: 30-50% mid-LAD stenosis but otherwise normal cors    Past Surgical History  Procedure Laterality Date  . Abdominal hysterectomy    . Back surgery    . L4-l5 fusion    . Cardiac catheterization N/A 07/20/2015    Procedure: Left Heart Cath and Coronary Angiography;  Surgeon: Lyn RecordsHenry W Smith, MD;  [facility];  Service: Cardiovascular;  Laterality: N/A;    Family History  Problem Relation Age of Onset  . COPD Mother   . Heart disease Mother   . Diabetes Mother   . Aneurysm Mother   . Melanoma Father   . Diabetes Father   . Diabetes Sister   . Diabetes Brother   . Heart attack Brother   . Kidney disease Brother   . Cancer - Colon Maternal Aunt   . Kidney disease Maternal Aunt   . Diabetes Maternal Aunt   . Hypothyroidism Paternal Aunt   . Diabetes Maternal Grandmother   . Heart disease Maternal Grandmother   . Diabetes Maternal Grandfather   . Heart disease Maternal Grandfather   . Diabetes Paternal Grandmother   . Heart disease Paternal Grandmother   . Diabetes Paternal Grandfather   . Heart disease Paternal Grandfather   . Aneurysm Maternal Uncle   . Aneurysm Cousin     Social history:  reports that she has never smoked. She has never used smokeless tobacco. She reports that she drinks alcohol. She reports that she does not use illicit drugs.    Allergies  Allergen Reactions  . Gluten Meal     Medications:  Prior to Admission medications   Medication Sig Start Date End Date Taking? Authorizing Provider  aspirin EC 81 MG EC tablet Take 1 tablet (81 mg total) by mouth daily. 07/21/15   [place]  Bethena Midget, PA  b complex vitamins tablet Take 1 tablet by mouth daily.    Historical Provider, MD  Cholecalciferol ([facility] VIT D 5000 HIGH-POTENCY PO) Take 1 tablet by mouth daily.     Historical Provider, MD  Cyanocobalamin (VITAMIN B 12 PO) Take 2,500 mcg by mouth daily.    Historical Provider, MD  KLOR-CON 10 10 MEQ tablet Take 10 mg by mouth at bedtime.  06/07/15   Historical Provider, MD  liothyronine (CYTOMEL) 5 MCG tablet Take 10 mcg by mouth daily before breakfast.  06/07/15   Historical Provider, MD  Multiple Vitamins-Minerals (MULTIVITAMIN WITH MINERALS) tablet Take 1 tablet by mouth daily.    Historical Provider, MD  NON FORMULARY Take 500 mg by mouth daily.  SERRAPEPTASE    Historical Provider, MD  Omega-3 Fatty Acids (OMEGA-3 FISH OIL PO) Take 1 capsule by mouth 2 (two) times daily. OMEGA PURE 820    Historical Provider, MD  Probiotic Product (PROBIOTIC & ACIDOPHILUS EX ST PO) Take 1 tablet by mouth daily.    Historical Provider, MD  progesterone (PROMETRIUM) 200 MG capsule Take 200 mg by mouth daily.    Historical Provider, MD  topiramate (TOPAMAX) 100 MG tablet One half tablet in the morning, 1.5 tablets in the evening 07/20/15   York Spaniel, MD    ROS:  Out of a complete 14 system review of symptoms, the patient complains only of the following symptoms, and all other reviewed systems are negative.  Ringing in the ears on the left Food allergies Headache, passing out  Blood pressure 106/74, pulse 76, height  (1.651 m), weight 170 lb 8 oz (77.338 kg).  Physical Exam  General: The patient is alert and cooperative at the time of the examination. The patient is moderately obese.  Skin: No significant peripheral edema is noted.   Neurologic Exam  Mental status: The patient is alert and oriented x 3 at the time of the examination. The patient has apparent normal recent and remote memory, with an apparently normal attention span and concentration ability.   Cranial nerves: Facial symmetry is present. Speech is normal, no aphasia or dysarthria is noted. Extraocular movements are full. Visual fields are full.  Motor: The patient has good strength in all 4 extremities.  Sensory examination: Soft touch sensation is symmetric on the face, arms, and legs.  Coordination: The patient has good finger-nose-finger and heel-to-shin bilaterally.  Gait and station: The patient has a normal gait. Tandem gait is normal. Romberg is negative. No drift is seen.  Reflexes: Deep tendon reflexes are symmetric.   Assessment/Plan:  1. Basilar migraine  2. Episodic syncope  The patient will be sent for an EEG study secondary to the syncopal  events. The patient will remain on the Topamax for the headache. The patient uses brand name Topamax only. She has gone on a gluten-free diet which has significantly improved her headaches and the sensitivity of the head and neck. She will follow-up in 4-5 months.  Marlan Palau MD 07/26/2015 7:08 PM  [facility] [address] [place], [place] [address]  Phone [phone] Fax [phone]

## 2015-07-27 ENCOUNTER — Telehealth: Payer: Self-pay | Admitting: Neurology

## 2015-07-27 NOTE — Telephone Encounter (Signed)
I called patient. The brand name Topamax has been denied, if the patient will allow, I will call in the generic form of the medication. Otherwise, we will have to go to another medication.

## 2015-07-28 MED ORDER — TOPIRAMATE 100 MG PO TABS: ORAL_TABLET | ORAL | Status: DC

## 2015-07-28 NOTE — Addendum Note (Signed)
Addended by: Stephanie AcreWILLIS, CHARLES on: 07/28/2015 05:16 PM   Modules accepted: Orders

## 2015-07-28 NOTE — Telephone Encounter (Signed)
I have sent in a prescription for the generic Topamax.

## 2015-07-28 NOTE — Telephone Encounter (Signed)
Pt called said she is willing to try the generic topamax and to please proceed with it.

## 2015-08-23 ENCOUNTER — Ambulatory Visit (INDEPENDENT_AMBULATORY_CARE_PROVIDER_SITE_OTHER): Payer: 59 | Admitting: Diagnostic Neuroimaging

## 2015-08-23 DIAGNOSIS — G43109 Migraine with aura, not intractable, without status migrainosus: Secondary | ICD-10-CM

## 2015-08-23 DIAGNOSIS — R55 Syncope and collapse: Secondary | ICD-10-CM

## 2015-09-02 NOTE — Procedures (Signed)
   [facility]  EEG (ELECTROENCEPHALOGRAM) REPORT   STUDY DATE: 08/23/15 PATIENT NAME: Maria Becker DOB: December 25, 1960 MRN: [id number]  ORDERING CLINICIAN: York Spanielharles K Willis, MD   TECHNOLOGIST: Gearldine ShownLorraine Jones TECHNIQUE: Electroencephalogram was recorded utilizing standard 10-20 system of lead placement and reformatted into average and bipolar montages.  RECORDING TIME: 24 minutes ACTIVATION: hyperventilation  CLINICAL INFORMATION: 55 year old female with syncope and headaches.  FINDINGS: Background rhythms of 14-15 hertz and 20-30 microvolts. No focal, lateralizing, epileptiform activity or seizures are seen. Patient recorded in the awake and drowsy state. EKG channel shows regular rhythm of 50-60 beats per minute.   IMPRESSION:  Normal EEG in the awake and drowsy states.    INTERPRETING PHYSICIAN:  Suanne MarkerVIKRAM R. Erinn Huskins, MD Certified in Neurology, Neurophysiology and Neuroimaging  [facility] [address], [address] [place], [place] [address] [phone]

## 2015-09-03 ENCOUNTER — Telehealth: Payer: Self-pay | Admitting: Neurology

## 2015-09-03 MED ORDER — TOPIRAMATE 100 MG PO TABS: ORAL_TABLET | ORAL | Status: DC

## 2015-09-03 NOTE — Telephone Encounter (Signed)
I called patient. The headaches remain very frequent. She has had 7 severe headaches in the last month. She could not get brand name Topamax which has worked in the past. She is on a total of 200 mg daily, we will go up to 2 and 50 mg daily and possibly to 300 mg a day. The EEG study done was normal.

## 2015-11-29 ENCOUNTER — Other Ambulatory Visit: Payer: Self-pay | Admitting: Neurology

## 2015-12-07 ENCOUNTER — Telehealth: Payer: Self-pay | Admitting: Neurology

## 2015-12-07 MED ORDER — PREDNISONE 5 MG PO TABS: ORAL_TABLET | ORAL | 0 refills | Status: DC

## 2015-12-07 NOTE — Telephone Encounter (Signed)
Patient called regarding 3 migraines since Sunday and has been having passing out spell, starts freezing to death. Requests Prednisone to knock it out of her.

## 2015-12-07 NOTE — Telephone Encounter (Signed)
I called patient. The patient has had a flurry of migraine headaches over the last 3 or 4 days, she ate oatmeal and she has a gluten intolerance issue. I will call in a prednisone Dosepak for her. Prior to this episode, she had done very well with her headaches over the last 6 months.

## 2016-01-07 ENCOUNTER — Other Ambulatory Visit: Payer: Self-pay | Admitting: Obstetrics and Gynecology

## 2016-02-06 ENCOUNTER — Other Ambulatory Visit: Payer: Self-pay | Admitting: Neurology

## 2016-03-10 ENCOUNTER — Other Ambulatory Visit: Payer: Self-pay

## 2016-03-10 MED ORDER — TOPIRAMATE 100 MG PO TABS: ORAL_TABLET | ORAL | 0 refills | Status: DC

## 2016-03-10 NOTE — Telephone Encounter (Signed)
90 day rx e-scribed per faxed request from pharmacy. 

## 2016-06-08 ENCOUNTER — Other Ambulatory Visit: Payer: Self-pay | Admitting: Neurology

## 2016-06-19 ENCOUNTER — Telehealth: Payer: Self-pay | Admitting: Neurology

## 2016-06-19 NOTE — Telephone Encounter (Signed)
Pt called to schedule f/u with Dr Lacretia Nicks for migraines. He is booked out, an appt was scheduled with Aundra Millet for 6/11 (this will be 1st appt with NP).

## 2016-08-21 ENCOUNTER — Encounter: Payer: Self-pay | Admitting: Adult Health

## 2016-08-21 ENCOUNTER — Ambulatory Visit (INDEPENDENT_AMBULATORY_CARE_PROVIDER_SITE_OTHER): Payer: 59 | Admitting: Adult Health

## 2016-08-21 VITALS — BP 117/73 | HR 66 | Resp 16 | Ht 65.0 in | Wt 144.5 lb

## 2016-08-21 DIAGNOSIS — G43109 Migraine with aura, not intractable, without status migrainosus: Secondary | ICD-10-CM

## 2016-08-21 MED ORDER — TOPIRAMATE 100 MG PO TABS: 200.0000 mg | ORAL_TABLET | Freq: Every day | ORAL | 3 refills | Status: DC

## 2016-08-21 NOTE — Progress Notes (Signed)
PATIENT: Maria Becker DOB: October 27, 1960  REASON FOR VISIT: follow up- basilar migraine headaches HISTORY FROM: patient  HISTORY OF PRESENT ILLNESS: Today 08/21/2016: Maria Becker is a 56 year old female with a history of basilar migraine headaches. She returns today for follow-up. She is currently taking Topamax 200 mg daily. She  Reports that she has seasonal migranes.  Reports  that most of her migraines occur at change of each season. She reports that her last severe headache was approximately 10 weeks ago Her migraines typically starts with visual changes in the left eye. Followed by a severe headache. She states that since she has eliminated gluten and dairy from her diet her headaches have improved. She was also able to discontinue medication that was treating Fibromyalgia. She is currently pleased with her migraine frequency. She returns today for follow-up.    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 The patient has also had episodes of syncope. The patient indicates that if she has any bright light exposure from the left side she may have an episode of sudden syncope preceded briefly by a cold sensation in the hands. The patient last had an event 5 weeks ago. The patient has undergone a cardiac workup with a stress test and then a coronary artery catheterization that was unremarkable. The patient indicates that after the blackouts she has the sensation of being somewhat cognitively slow, and she will sleep for 2 or 3 hours afterwards. The patient has been on Topamax taking a total of 200 mg daily. Her insurance company now requires prior authorization for the brand name medication. The patient could not tolerate the generic medication of topiramate previously. The patient had been doing quite well with her headaches on the Topamax. She has not had any headaches over the last 5 weeks, but  she had to lower the dose on the Topamax recently because she was running out of medications, and her headaches have returned over the last 5 days. She comes to this office for an evaluation.   REVIEW OF SYSTEMS: Out of a complete 14 system review of symptoms, the patient complains only of the following symptoms, and all other reviewed systems are negative.   painful urination, frequency of urination, ae,foodalegis,cold intolerance,frequnt waking, daytime sleepiness, blurred vision,fatigue, ringing in ears  ALLERGIES: Allergies  Allergen Reactions  . Gluten Meal     HOME MEDICATIONS: Outpatient Medications Prior to Visit  Medication Sig Dispense Refill  . aspirin EC 81 MG EC tablet Take 1 tablet (81 mg total) by mouth daily.    Marland Kitchen b complex vitamins tablet Take 1 tablet by mouth daily.    . Cholecalciferol ([facility] VIT D 5000 HIGH-POTENCY PO) Take 1 tablet by mouth daily.     . Cyanocobalamin (VITAMIN B 12 PO) Take 2,500 mcg by mouth daily.    Marland Kitchen KLOR-CON 10 10 MEQ tablet Take 10 mg by mouth at bedtime.     Marland Kitchen liothyronine (CYTOMEL) 5 MCG tablet Take 10 mcg by mouth daily before breakfast.     . Multiple Vitamins-Minerals (MULTIVITAMIN WITH MINERALS) tablet Take 1 tablet by mouth daily.    . NON FORMULARY Take 500 mg by mouth daily. SERRAPEPTASE    . Omega-3 Fatty Acids (OMEGA-3 FISH OIL PO) Take 1 capsule by mouth 2 (two) times daily. OMEGA PURE 820    . predniSONE (DELTASONE) 5 MG tablet Begin taking 6 tablets daily, taper by one tablet  daily until off the medication. 21 tablet 0  . Probiotic Product (PROBIOTIC & ACIDOPHILUS EX ST PO) Take 1 tablet by mouth daily.    . progesterone (PROMETRIUM) 200 MG capsule Take 200 mg by mouth daily.    Marland Kitchen topiramate (TOPAMAX) 100 MG tablet TAKE 1/2 TABLET BY MOUTH IN THE MORNING AND 1 & 1/2 TABLETS BY MOUTH IN THE EVENING 180 tablet 0   Facility-Administered Medications Prior to Visit  Medication Dose Route Frequency Provider Last Rate Last Dose  .  gadopentetate dimeglumine (MAGNEVIST) injection 20 mL  20 mL Intravenous Once PRN York Spaniel, MD        PAST MEDICAL HISTORY: Past Medical History:  Diagnosis Date  . 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 CAD (coronary artery disease)    a. cath 07/2015: 30-50% mid-LAD stenosis but otherwise normal cors  . Fibromyalgia   . GERD (gastroesophageal reflux disease)   . Headache   . Hyperlipidemia    a. not currently on statin therapy  . Hypertension     PAST SURGICAL HISTORY: Past Surgical History:  Procedure Laterality Date  . ABDOMINAL HYSTERECTOMY    . BACK SURGERY    . CARDIAC CATHETERIZATION N/A 07/20/2015   Procedure: Left Heart Cath and Coronary Angiography;  Surgeon: Lyn Records, MD;  Location[facility] INVASIVE CV LAB;  Service: Cardiovascular;  Laterality: N/A;  . L4-L5 fusion      FAMILY HISTORY: Family History  Problem Relation Age of Onset  . COPD Mother   . Heart disease Mother   . Diabetes Mother   . Aneurysm Mother   . Melanoma Father   . Diabetes Father   . Diabetes Sister   . Diabetes Brother   . Heart attack Brother   . Kidney disease Brother   . Cancer - Colon Maternal Aunt   . Kidney disease Maternal Aunt   . Diabetes Maternal Aunt   . Hypothyroidism Paternal Aunt   . Diabetes Maternal Grandmother   . Heart disease Maternal Grandmother   . Diabetes Maternal Grandfather   . Heart disease Maternal Grandfather   . Diabetes Paternal Grandmother   . Heart disease Paternal Grandmother   . Diabetes Paternal Grandfather   . Heart disease Paternal Grandfather   . Aneurysm Maternal Uncle   . Aneurysm Cousin     SOCIAL HISTORY: Social History   Social History  . Marital status: Married    Spouse name: N/A  . Number of children: 2  . Years of education: 13   Occupational History  . self-employed    Social History Main Topics  . Smoking status: Never Smoker  . Smokeless tobacco: Never Used  . Alcohol use Yes     Comment: occasional  . Drug  use: No  . Sexual activity: Yes    Birth control/ protection: Other-see comments   Other Topics Concern  . Not on file   Social History Narrative   Patient drinks 1 soda daily.   Patient is right handed.       PHYSICAL EXAM  Vitals:   08/21/16 0916  BP: 117/73  Pulse: 66  Resp: 16  Weight: 144 lb 8 oz (65.5 kg)  Height: 5\' 5"  (1.651 m)   Body mass index is 24.05 kg/m.  Generalized: Well developed, in no acute distress   Neurological examination  Mentation: Alert oriented to time, place, history taking. Follows all commands speech and language fluent Cranial nerve II-XII: Pupils were equal round reactive to light. Extraocular movements  were full, visual field were full on confrontational test. Facial sensation and strength were normal. Uvula tongue midline. Head turning and shoulder shrug  were normal and symmetric. Motor: The motor testing reveals 5 over 5 strength of all 4 extremities. Good symmetric motor tone is noted throughout.  Sensory: Sensory testing is intact to soft touch on all 4 extremities. No evidence of extinction is noted.  Coordination: Cerebellar testing reveals good finger-nose-finger and heel-to-shin bilaterally.  Gait and station: Gait is normal. Tandem gait is normal. Romberg is negative. No drift is seen.  Reflexes: Deep tendon reflexes are symmetric and normal bilaterally.   DIAGNOSTIC DATA (LABS, IMAGING, TESTING) - I reviewed patient records, labs, notes, testing and imaging myself where available.  Lab Results  Component Value Date   WBC 4.8 07/20/2015   HGB 13.0 07/20/2015   HCT 39.8 07/20/2015   MCV 88.6 07/20/2015   PLT 216 07/20/2015      Component Value Date/Time   NA 143 07/20/2015 1046   NA 143 02/01/2015 1144   K 3.5 07/20/2015 1046   CL 109 07/20/2015 1046   CO2 23 07/20/2015 1046   GLUCOSE 90 07/20/2015 1046   BUN 14 07/20/2015 1046   BUN 13 02/01/2015 1144   CREATININE 1.00 07/20/2015 1046   CALCIUM 9.8 07/20/2015 1046     PROT 7.0 07/20/2015 1046   ALBUMIN 3.8 07/20/2015 1046   AST 21 07/20/2015 1046   ALT 16 07/20/2015 1046   ALKPHOS 61 07/20/2015 1046   BILITOT 0.5 07/20/2015 1046   GFRNONAA >60 07/20/2015 1046   GFRAA >60 07/20/2015 1046    Lab Results  Component Value Date   HGBA1C 5.6 07/20/2015   No results found for: [id number] Lab Results  Component Value Date   TSH 3.071 07/20/2015      ASSESSMENT AND PLAN 56 y.o. year old female  has a past medical history of Basilar artery migraine (02/01/2015); CAD (coronary artery disease); Fibromyalgia; GERD (gastroesophageal reflux disease); Headache; Hyperlipidemia; and Hypertension. here with:  1.Basiliar Migraine  Overall the patient is doing well. She will continue on Topamax 200 mg at bedtime. She is advised that if her symptoms worsen or she develops new symptoms she should let us know. Follow-up in 1 year or sooner if needed.   I spent 15 minutes with the patient. 50% of this time was spent discussing medication.       Butch PennyMegan Yuri Fana, MSN, NP-C 08/21/2016, 9:13 AM [facility] [address], [address] [place], [place] [address] [phone]

## 2016-08-21 NOTE — Progress Notes (Signed)
I have read the note, and I agree with the clinical assessment and plan.  Datrell Dunton KEITH   

## 2016-08-21 NOTE — Patient Instructions (Signed)
Continue Topamax If your symptoms worsen or you develop new symptoms please let us know.   

## 2017-08-27 ENCOUNTER — Encounter: Payer: Self-pay | Admitting: Adult Health

## 2017-08-27 ENCOUNTER — Ambulatory Visit: Payer: 59 | Admitting: Adult Health

## 2017-08-27 VITALS — BP 108/68 | HR 61 | Ht 65.0 in | Wt 151.2 lb

## 2017-08-27 DIAGNOSIS — G43109 Migraine with aura, not intractable, without status migrainosus: Secondary | ICD-10-CM

## 2017-08-27 MED ORDER — TOPIRAMATE 100 MG PO TABS: 200.0000 mg | ORAL_TABLET | Freq: Every day | ORAL | 3 refills | Status: DC

## 2017-08-27 MED ORDER — PREDNISONE 5 MG PO TABS: ORAL_TABLET | ORAL | 0 refills | Status: DC

## 2017-08-27 NOTE — Progress Notes (Signed)
I have read the note, and I agree with the clinical assessment and plan.  Somaya Grassi K Mandisa Persinger   

## 2017-08-27 NOTE — Progress Notes (Signed)
PATIENT: Maria Becker DOB: 07/06/1960  REASON FOR VISIT: follow up HISTORY FROM: patient  HISTORY OF PRESENT ILLNESS: Today 08/27/17: Maria Becker is a 57 year old female with a history of basilar migraine headaches.  Maria Becker returns today for follow-up.  He states that Maria Becker has had approximately 6 migraines in the last year.  Maria Becker continues on Topamax and tolerates it well.  Maria Becker does state that starting 3 weeks ago Maria Becker had a dull headache that has been ongoing.  Maria Becker states that this has occurred before in the past.  Maria Becker was recently put on hormone therapy imvexxy.  Maria Becker states that this has been beneficial for Maria Becker mood and energy level.  Maria Becker states that stress is a trigger for Maria Becker migraines.  Maria Becker reports that when Maria Becker gets a true migraine Maria Becker has to go to bed in order to have any relief.  Maria Becker returns today for an evaluation.  HISTORY 08/21/2016: Maria Becker is a 57 year old female with a history of basilar migraine headaches. Maria Becker returns today for follow-up. Maria Becker is currently taking Topamax 200 mg daily. Maria Becker  Reports that Maria Becker has seasonal migranes.  Reports  that most of Maria Becker migraines occur at change of each season. Maria Becker reports that Maria Becker last severe headache was approximately 10 weeks ago Maria Becker migraines typically starts with visual changes in the left eye. Followed by a severe headache. Maria Becker states that since Maria Becker has eliminated gluten and dairy from Maria Becker diet Maria Becker headaches have improved. Maria Becker was also able to discontinue medication that was treating Fibromyalgia. Maria Becker is currently pleased with Maria Becker migraine frequency. Maria Becker returns today for follow-up.      REVIEW OF SYSTEMS: Out of a complete 14 system review of symptoms, the patient complains only of the following symptoms, and all other reviewed systems are negative.  Double vision, ringing in ears, headache  ALLERGIES: Allergies  Allergen Reactions  . Gluten Meal   . Other     Dairy cow milk    HOME MEDICATIONS: Outpatient Medications  Prior to Visit  Medication Sig Dispense Refill  . b complex vitamins tablet Take 1 tablet by mouth daily.    . Cholecalciferol ([facility] VIT D 5000 HIGH-POTENCY PO) Take 1 tablet by mouth daily.     . Cyanocobalamin (VITAMIN B 12 PO) Take 2,500 mcg by mouth daily.    . Estradiol (IMVEXXY MAINTENANCE PACK) 10 MCG INST Place 10 mcg vaginally 2 (two) times daily.    Marland Kitchen liothyronine (CYTOMEL) 5 MCG tablet Take 10 mcg by mouth daily before breakfast.     . Multiple Vitamins-Minerals (MULTIVITAMIN WITH MINERALS) tablet Take 1 tablet by mouth daily.    . Omega-3 Fatty Acids (OMEGA-3 FISH OIL PO) Take 1 capsule by mouth 2 (two) times daily. OMEGA PURE 820    . topiramate (TOPAMAX) 100 MG tablet Take 2 tablets (200 mg total) by mouth at bedtime. 180 tablet 3  . vitamin E 400 UNIT capsule Take 400 Units by mouth daily.    Marland Kitchen aspirin EC 81 MG EC tablet Take 1 tablet (81 mg total) by mouth daily. (Patient not taking: Reported on 08/21/2016)    . KLOR-CON 10 10 MEQ tablet Take 10 mg by mouth at bedtime.     . NON FORMULARY Take 500 mg by mouth daily. SERRAPEPTASE    . predniSONE (DELTASONE) 5 MG tablet Begin taking 6 tablets daily, taper by one tablet daily until off the medication. 21 tablet 0  . Probiotic Product (PROBIOTIC & ACIDOPHILUS EX ST  PO) Take 1 tablet by mouth daily.    . progesterone (PROMETRIUM) 200 MG capsule Take 200 mg by mouth daily.     Facility-Administered Medications Prior to Visit  Medication Dose Route Frequency Provider Last Rate Last Dose  . gadopentetate dimeglumine (MAGNEVIST) injection 20 mL  20 mL Intravenous Once PRN York Spaniel, MD        PAST MEDICAL HISTORY: Past Medical History:  Diagnosis Date  . 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 CAD (coronary artery disease)    a. cath 07/2015: 30-50% mid-LAD stenosis but otherwise normal cors  . Fibromyalgia   . GERD (gastroesophageal reflux disease)   . Headache   . Hyperlipidemia    a. not currently on statin therapy  .  Hypertension     PAST SURGICAL HISTORY: Past Surgical History:  Procedure Laterality Date  . ABDOMINAL HYSTERECTOMY    . BACK SURGERY    . CARDIAC CATHETERIZATION N/A 07/20/2015   Procedure: Left Heart Cath and Coronary Angiography;  Surgeon: Lyn Records, MD;  Location: [facility] INVASIVE CV LAB;  Service: Cardiovascular;  Laterality: N/A;  . L4-L5 fusion      FAMILY HISTORY: Family History  Problem Relation Age of Onset  . COPD Mother   . Heart disease Mother   . Diabetes Mother   . Aneurysm Mother   . Melanoma Father   . Diabetes Father   . Diabetes Sister   . Diabetes Brother   . Heart attack Brother   . Kidney disease Brother   . Cancer - Colon Maternal Aunt   . Kidney disease Maternal Aunt   . Diabetes Maternal Aunt   . Hypothyroidism Paternal Aunt   . Diabetes Maternal Grandmother   . Heart disease Maternal Grandmother   . Diabetes Maternal Grandfather   . Heart disease Maternal Grandfather   . Diabetes Paternal Grandmother   . Heart disease Paternal Grandmother   . Diabetes Paternal Grandfather   . Heart disease Paternal Grandfather   . Aneurysm Maternal Uncle   . Aneurysm Cousin     SOCIAL HISTORY: Social History   Socioeconomic History  . Marital status: Married    Spouse name: Not on file  . Number of children: 2  . Years of education: 43  . Highest education level: Not on file  Occupational History  . Occupation: self-employed  Social Needs  . Financial resource strain: Not on file  . Food insecurity:    Worry: Not on file    Inability: Not on file  . Transportation needs:    Medical: Not on file    Non-medical: Not on file  Tobacco Use  . Smoking status: Never Smoker  . Smokeless tobacco: Never Used  Substance and Sexual Activity  . Alcohol use: Yes    Comment: occasional  . Drug use: No  . Sexual activity: Yes    Birth control/protection: Other-see comments  Lifestyle  . Physical activity:    Days per week: Not on file    Minutes per  session: Not on file  . Stress: Not on file  Relationships  . Social connections:    Talks on phone: Not on file    Gets together: Not on file    Attends religious service: Not on file    Active member of club or organization: Not on file    Attends meetings of clubs or organizations: Not on file    Relationship status: Not on file  . Intimate partner violence:  Fear of current or ex partner: Not on file    Emotionally abused: Not on file    Physically abused: Not on file    Forced sexual activity: Not on file  Other Topics Concern  . Not on file  Social History Narrative   Patient drinks 1 soda daily.   Patient is right handed.       PHYSICAL EXAM  Vitals:   08/27/17 0834  BP: 108/68  Pulse: 61  Weight: 151 lb 3.2 oz (68.6 kg)  Height: 5\' 5"  (1.651 m)   Body mass index is 25.16 kg/m.  Generalized: Well developed, in no acute distress   Neurological examination  Mentation: Alert oriented to time, place, history taking. Follows all commands speech and language fluent Cranial nerve II-XII: Pupils were equal round reactive to light. Extraocular movements were full, visual field were full on confrontational test. Facial sensation and strength were normal. Uvula tongue midline. Head turning and shoulder shrug  were normal and symmetric. Motor: The motor testing reveals 5 over 5 strength of all 4 extremities. Good symmetric motor tone is noted throughout.  Sensory: Sensory testing is intact to soft touch on all 4 extremities. No evidence of extinction is noted.  Coordination: Cerebellar testing reveals good finger-nose-finger and heel-to-shin bilaterally.  Gait and station: Gait is normal. Tandem gait is normal. Romberg is negative. No drift is seen.  Reflexes: Deep tendon reflexes are symmetric and normal bilaterally.   DIAGNOSTIC DATA (LABS, IMAGING, TESTING) - I reviewed patient records, labs, notes, testing and imaging myself where available.  Lab Results  Component  Value Date   WBC 4.8 07/20/2015   HGB 13.0 07/20/2015   HCT 39.8 07/20/2015   MCV 88.6 07/20/2015   PLT 216 07/20/2015      Component Value Date/Time   NA 143 07/20/2015 1046   NA 143 02/01/2015 1144   K 3.5 07/20/2015 1046   CL 109 07/20/2015 1046   CO2 23 07/20/2015 1046   GLUCOSE 90 07/20/2015 1046   BUN 14 07/20/2015 1046   BUN 13 02/01/2015 1144   CREATININE 1.00 07/20/2015 1046   CALCIUM 9.8 07/20/2015 1046   PROT 7.0 07/20/2015 1046   ALBUMIN 3.8 07/20/2015 1046   AST 21 07/20/2015 1046   ALT 16 07/20/2015 1046   ALKPHOS 61 07/20/2015 1046   BILITOT 0.5 07/20/2015 1046   GFRNONAA >60 07/20/2015 1046   GFRAA >60 07/20/2015 1046   Lab Results  Component Value Date   CHOL (H) 02/28/2007    237        ATP III CLASSIFICATION:  <200     mg/dL   Desirable  478-295200-239  mg/dL   Borderline High  >=621>=240    mg/dL   High   HDL 71 30/86/578412/18/2008   LDLCALC (H) 02/28/2007    159        Total Cholesterol/HDL:CHD Risk Coronary Heart Disease Risk Table                     Men   Women  1/2 Average Risk   3.4   3.3   TRIG 37 02/28/2007   CHOLHDL 3.3 02/28/2007   Lab Results  Component Value Date   HGBA1C 5.6 07/20/2015   No results found for: [id number] Lab Results  Component Value Date   TSH 3.071 07/20/2015      ASSESSMENT AND PLAN 57 y.o. 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 here with:  1.  Basilar migraines  Overall the patient is doing well.  Maria Becker will continue on Topamax.  I will call in a prednisone Dosepak for Maria Becker current headache that has been ongoing for the last 3 weeks.  Advised that if Maria Becker symptoms worsen or Maria Becker develops new symptoms Maria Becker should let us know.  He will follow-up in 6 months or sooner if needed.  I spent 15 minutes with the patient. 50% of this time was spent discussing Maria Becker  medication.   Butch Penny, MSN, NP-C 08/27/2017, 8:52 AM [facility] [address], [address] [place], [place] [address] [phone]

## 2017-08-27 NOTE — Patient Instructions (Signed)
Your Plan:  Continue Topamax Prednisone dosepak called in If your symptoms worsen or you develop new symptoms please let us know.   [facility]. I hope we have been able to provide you high quality care today.  You may receive a patient satisfaction survey over the next few weeks. We would appreciate your feedback and comments so that we may continue to improve ourselves and the health of our patients.

## 2017-11-05 ENCOUNTER — Ambulatory Visit (HOSPITAL_COMMUNITY)
Admission: RE | Admit: 2017-11-05 | Discharge: 2017-11-05 | Disposition: A | Discharge status: Home / Self Care | Payer: 59 | Source: Ambulatory Visit | Attending: Internal Medicine | Admitting: Internal Medicine

## 2017-11-05 ENCOUNTER — Other Ambulatory Visit: Payer: Self-pay | Admitting: Internal Medicine

## 2017-11-05 DIAGNOSIS — N2889 Other specified disorders of kidney and ureter: Secondary | ICD-10-CM | POA: Diagnosis not present

## 2017-11-05 DIAGNOSIS — R1031 Right lower quadrant pain: Secondary | ICD-10-CM | POA: Insufficient documentation

## 2017-11-05 DIAGNOSIS — N200 Calculus of kidney: Secondary | ICD-10-CM | POA: Diagnosis not present

## 2017-11-06 ENCOUNTER — Other Ambulatory Visit: Payer: Self-pay | Admitting: Internal Medicine

## 2017-11-06 DIAGNOSIS — R1031 Right lower quadrant pain: Secondary | ICD-10-CM

## 2017-11-06 DIAGNOSIS — N200 Calculus of kidney: Secondary | ICD-10-CM

## 2017-11-06 DIAGNOSIS — N132 Hydronephrosis with renal and ureteral calculous obstruction: Secondary | ICD-10-CM

## 2017-11-13 ENCOUNTER — Ambulatory Visit (HOSPITAL_COMMUNITY)
Admission: RE | Admit: 2017-11-13 | Discharge: 2017-11-13 | Disposition: A | Discharge status: Home / Self Care | Payer: 59 | Source: Ambulatory Visit | Attending: Internal Medicine | Admitting: Internal Medicine

## 2017-11-13 DIAGNOSIS — I700 Atherosclerosis of aorta: Secondary | ICD-10-CM | POA: Insufficient documentation

## 2017-11-13 DIAGNOSIS — N200 Calculus of kidney: Secondary | ICD-10-CM

## 2017-11-13 DIAGNOSIS — R1031 Right lower quadrant pain: Secondary | ICD-10-CM | POA: Diagnosis not present

## 2017-11-13 DIAGNOSIS — N132 Hydronephrosis with renal and ureteral calculous obstruction: Secondary | ICD-10-CM

## 2017-11-13 DIAGNOSIS — K5730 Diverticulosis of large intestine without perforation or abscess without bleeding: Secondary | ICD-10-CM | POA: Insufficient documentation

## 2018-05-13 ENCOUNTER — Telehealth: Payer: Self-pay | Admitting: Neurology

## 2018-05-13 MED ORDER — PREDNISONE 5 MG PO TABS: ORAL_TABLET | ORAL | 0 refills | Status: DC

## 2018-05-13 NOTE — Telephone Encounter (Signed)
Pt states she has had a headache for 3 days and has not been able to function. She would like to know if something can be called in for her. Please advise.

## 2018-05-13 NOTE — Telephone Encounter (Signed)
I contacted the pt. She states since 05/10/18 she has had a headache with n/v and light/noise sensitivity. Pt states she has been treating her headache with her topamax 100 mg 2 tablets at bed time and ibuprofen but has not had relief.   Pt has previously been prescribed prednisone 5 mg dosage pack and reports this has been successful in the past. Pt confirmed the last time she received a dosage pack was 08/27/17 provided by MM, NP and reports the medication worked well for her.  Pt is requesting this be sent again. I advised I would speak with work in doctor due to Dr. Anne Hahn being out of the office and pt was agreeable.  I discuss message with Dr. Marjory Lies ( work in MD) and he provided verbal order Prednisone 5 mg-6 tabs to start and then taper by 1 tablet each day until supply is gone. I advised pt [facility] [address] and pt verbalized appreciation/understanding.

## 2018-05-13 NOTE — Telephone Encounter (Signed)
I reviewed and agree with plan.   Suanne Marker, MD 05/13/2018, 1:33 PM Certified in Neurology, Neurophysiology and Neuroimaging  [facility] [address] [place], [place] [address] [phone]

## 2018-08-29 ENCOUNTER — Telehealth: Payer: Self-pay

## 2018-08-29 NOTE — Telephone Encounter (Signed)
Unable to get in contact with the patient to convert their office appt with Megan on 09/02/2018 into a mychart video visit. I left a voicemail asking the patient to return my call. Office number was provided.    If patient calls back please convert their office visit into a mychart video visit. ' 

## 2018-09-02 ENCOUNTER — Ambulatory Visit: Payer: 59 | Admitting: Adult Health

## 2018-09-02 ENCOUNTER — Telehealth (INDEPENDENT_AMBULATORY_CARE_PROVIDER_SITE_OTHER): Payer: 59 | Admitting: Adult Health

## 2018-09-02 DIAGNOSIS — G43109 Migraine with aura, not intractable, without status migrainosus: Secondary | ICD-10-CM | POA: Diagnosis not present

## 2018-09-02 NOTE — Progress Notes (Signed)
I have read the note, and I agree with the clinical assessment and plan.  Maria Becker   

## 2018-09-02 NOTE — Telephone Encounter (Addendum)
Unable to get in contact with the patient to convert their office appt with Megan on 09/02/2018 into a mychart video visit. I left a voicemail asking the patient to return my call and to r/s her appt. Office number was provided. Appt has been cancelled.    If patient calls back please r/s a mychart video visit with Megan.

## 2018-09-02 NOTE — Progress Notes (Signed)
PATIENT: Maria Becker DOB: [birth year]  REASON FOR VISIT: follow up HISTORY FROM: patient  Virtual Visit via Video Note  I connected with Maria Becker on 09/02/18 at  1:30 PM EDT by a video enabled telemedicine application located remotely at [facility] and verified that I am speaking with the correct person using two identifiers who was located at their own home.   I discussed the limitations of evaluation and management by telemedicine and the availability of in person appointments. The patient expressed understanding and agreed to proceed.   PATIENT: Maria Becker DOB: 1960/12/11  REASON FOR VISIT: follow up HISTORY FROM: patient  HISTORY OF PRESENT ILLNESS: Today 09/02/18:  Maria Becker is a 58 year old female with a history of basilar migraine headaches.  She states that her headaches have increased recently but she contributes this to stress.  She states that she was out of work for 8 weeks due to COVID-19.  She states that she has not started back at work but is been very stressful.  She states that she is having to take Tylenol approximately 4 times a week for the headache.  She reports that she responds well to Tylenol.  She feels that her headaches will get better as her stress level starts to decrease.  She joins me today for virtual visit.  HISTORY 08/27/17: Maria Becker is a 58 year old female with a history of basilar migraine headaches.  She returns today for follow-up.  He states that she has had approximately 6 migraines in the last year.  She continues on Topamax and tolerates it well.  She does state that starting 3 weeks ago she had a dull headache that has been ongoing.  She states that this has occurred before in the past.  She was recently put on hormone therapy imvexxy.  She states that this has been beneficial for her mood and energy level.  She states that stress is a trigger for her migraines.  She reports that when she gets a  true migraine she has to go to bed in order to have any relief.  She returns today for an evaluation.  REVIEW OF SYSTEMS: Out of a complete 14 system review of symptoms, the patient complains only of the following symptoms, and all other reviewed systems are negative.  See HPI  ALLERGIES: Allergies  Allergen Reactions   Gluten Meal    Other     Dairy cow milk    HOME MEDICATIONS: Outpatient Medications Prior to Visit  Medication Sig Dispense Refill   b complex vitamins tablet Take 1 tablet by mouth daily.     Cholecalciferol ([facility] VIT D 5000 HIGH-POTENCY PO) Take 1 tablet by mouth daily.      Cyanocobalamin (VITAMIN B 12 PO) Take 2,500 mcg by mouth daily.     Estradiol (IMVEXXY MAINTENANCE PACK) 10 MCG INST Place 10 mcg vaginally 2 (two) times daily.     liothyronine (CYTOMEL) 5 MCG tablet Take 10 mcg by mouth daily before breakfast.      Multiple Vitamins-Minerals (MULTIVITAMIN WITH MINERALS) tablet Take 1 tablet by mouth daily.     Omega-3 Fatty Acids (OMEGA-3 FISH OIL PO) Take 1 capsule by mouth 2 (two) times daily. OMEGA PURE 820     predniSONE (DELTASONE) 5 MG tablet Begin taking 6 tablets daily, taper by one tablet daily until off the medication. 21 tablet 0   topiramate (TOPAMAX) 100 MG tablet Take 2 tablets (200 mg total) by mouth at  bedtime. 180 tablet 3   vitamin E 400 UNIT capsule Take 400 Units by mouth daily.     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 cath 07/2015: 30-50% mid-LAD stenosis but otherwise normal cors   Fibromyalgia    GERD (gastroesophageal reflux disease)    Headache    Hyperlipidemia    a. not currently on statin  therapy   Hypertension     PAST SURGICAL HISTORY: Past Surgical History:  Procedure Laterality Date   ABDOMINAL HYSTERECTOMY     BACK SURGERY     CARDIAC CATHETERIZATION N/A 07/20/2015   Procedure: Left Heart Cath and Coronary Angiography;  Surgeon: Lyn RecordsHenry W Smith, MD;  [facility];  Service: Cardiovascular;  Laterality: N/A;   L4-L5 fusion      FAMILY HISTORY: Family History  Problem Relation Age of Onset   COPD Mother    Heart disease Mother    Diabetes Mother    Aneurysm Mother    Melanoma Father    Diabetes Father    Diabetes Sister    Diabetes Brother    Heart attack Brother    Kidney disease Brother    Cancer - Colon Maternal Aunt    Kidney disease Maternal Aunt    Diabetes Maternal Aunt    Hypothyroidism Paternal Aunt    Diabetes Maternal Grandmother    Heart disease Maternal Grandmother    Diabetes Maternal Grandfather    Heart disease Maternal Grandfather    Diabetes Paternal Grandmother    Heart disease Paternal Grandmother    Diabetes Paternal Grandfather    Heart disease Paternal Grandfather    Aneurysm Maternal Uncle    Aneurysm Cousin     SOCIAL HISTORY: Social History   Socioeconomic History   Marital status: Married    Spouse name: Not on file   Number of children: 2   Years of education: 13   Highest education level: Not on file  Occupational History   Occupation: self-employed  Ecologistocial Needs   Financial resource strain: Not on file   Food insecurity    Worry: Not on file    Inability: Not on file   Transportation needs    Medical: Not on file    Non-medical: Not on file  Tobacco Use   Smoking status: Never Smoker   Smokeless tobacco: Never Used  Substance and Sexual Activity   Alcohol use: Yes    Comment: occasional   Drug use: No   Sexual activity: Yes    Birth control/protection: Other-see comments  Lifestyle   Physical activity    Days per week: Not on file    Minutes  per session: Not on file   Stress: Not on file  Relationships   Social connections    Talks on phone: Not on file    Gets together: Not on file    Attends religious service: Not on file    Active member of club or organization: Not on file    Attends meetings of clubs or organizations: Not on file    Relationship status: Not on file   Intimate partner violence    Fear of current or ex  partner: Not on file    Emotionally abused: Not on file    Physically abused: Not on file    Forced sexual activity: Not on file  Other Topics Concern   Not on file  Social History Narrative   Patient drinks 1 soda daily.   Patient is right handed.       PHYSICAL EXAM Generalized: Well developed, in no acute distress   Neurological examination  Mentation: Alert oriented to time, place, history taking. Follows all commands speech and language fluent Cranial nerve II-XII:Extraocular movements were full. Facial symmetry noted. uvula tongue midline. Head turning and shoulder shrug  were normal and symmetric. Motor: Good strength throughout subjectively per patient Sensory: Sensory testing is intact to soft touch on all 4 extremities subjectively per patient Coordination: Cerebellar testing reveals good finger-nose-finger  Reflexes: UTA  DIAGNOSTIC DATA (LABS, IMAGING, TESTING) - I reviewed patient records, labs, notes, testing and imaging myself where available.  Lab Results  Component Value Date   WBC 4.8 07/20/2015   HGB 13.0 07/20/2015   HCT 39.8 07/20/2015   MCV 88.6 07/20/2015   PLT 216 07/20/2015      Component Value Date/Time   NA 143 07/20/2015 1046   NA 143 02/01/2015 1144   K 3.5 07/20/2015 1046   CL 109 07/20/2015 1046   CO2 23 07/20/2015 1046   GLUCOSE 90 07/20/2015 1046   BUN 14 07/20/2015 1046   BUN 13 02/01/2015 1144   CREATININE 1.00 07/20/2015 1046   CALCIUM 9.8 07/20/2015 1046   PROT 7.0 07/20/2015 1046   ALBUMIN 3.8 07/20/2015 1046   AST 21 07/20/2015 1046     ALT 16 07/20/2015 1046   ALKPHOS 61 07/20/2015 1046   BILITOT 0.5 07/20/2015 1046   GFRNONAA >60 07/20/2015 1046   GFRAA >60 07/20/2015 1046   Lab Results  Component Value Date   CHOL (H) 02/28/2007    237        ATP III CLASSIFICATION:  <200     mg/dL   Desirable  811-914200-239  mg/dL   Borderline High  >=782>=240    mg/dL   High   HDL 71 95/62/130812/18/2008   LDLCALC (H) 02/28/2007    159        Total Cholesterol/HDL:CHD Risk Coronary Heart Disease Risk Table                     Men   Women  1/2 Average Risk   3.4   3.3   TRIG 37 02/28/2007   CHOLHDL 3.3 02/28/2007   Lab Results  Component Value Date   HGBA1C 5.6 07/20/2015   No results found for: VITAMINB12 Lab Results  Component Value Date   TSH 3.071 07/20/2015      ASSESSMENT AND PLAN 58 y.o. 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 here with:  1.  Basilar migraine headaches  Overall the patient has noticed an increase in her headache however she does not want to initiate any new medication at this time.  She will remain on Topamax 200 mg at bedtime.  Advised that if her headaches do not improve as her stress improves then she should let us know.  She will follow-up in 6 months or sooner if needed.   I spent 15 minutes with the patient this time was spent reviewing her headache frequency and medication.  Butch PennyMegan Zell Hylton, MSN, NP-C 09/02/2018, 1:47 PM [facility] [address], [address] [place], [place] [id number] [phone]

## 2018-09-20 ENCOUNTER — Other Ambulatory Visit: Payer: Self-pay | Admitting: Adult Health

## 2018-09-23 ENCOUNTER — Other Ambulatory Visit: Payer: Self-pay | Admitting: *Deleted

## 2018-09-23 MED ORDER — TOPIRAMATE 100 MG PO TABS: 200.0000 mg | ORAL_TABLET | Freq: Every day | ORAL | 3 refills | Status: DC

## 2019-03-17 ENCOUNTER — Ambulatory Visit: Payer: 59 | Admitting: Adult Health

## 2019-05-28 ENCOUNTER — Encounter: Payer: Self-pay | Admitting: Adult Health

## 2019-05-28 ENCOUNTER — Other Ambulatory Visit: Payer: Self-pay

## 2019-05-28 ENCOUNTER — Ambulatory Visit: Payer: 59 | Admitting: Adult Health

## 2019-05-28 VITALS — BP 109/69 | HR 68 | Temp 97.4°F | Ht 66.0 in | Wt 165.0 lb

## 2019-05-28 DIAGNOSIS — G43109 Migraine with aura, not intractable, without status migrainosus: Secondary | ICD-10-CM | POA: Diagnosis not present

## 2019-05-28 MED ORDER — TOPIRAMATE 100 MG PO TABS: 200.0000 mg | ORAL_TABLET | Freq: Every day | ORAL | 3 refills | Status: DC

## 2019-05-28 NOTE — Patient Instructions (Addendum)
Your Plan:  Continue topamax If your symptoms worsen or you develop new symptoms please let us know.       Thank you for coming to see us at [facility]. I hope we have been able to provide you high quality care today.  You may receive a patient satisfaction survey over the next few weeks. We would appreciate your feedback and comments so that we may continue to improve ourselves and the health of our patients.  

## 2019-05-28 NOTE — Progress Notes (Signed)
PATIENT: Maria Becker DOB: Oct 26, 1960  REASON FOR VISIT: follow up HISTORY FROM: patient  HISTORY OF PRESENT ILLNESS: Today 05/28/19:  Ms. Maria Becker is a 59 year old female with a history of basilar migraine headaches.  She returns today for follow-up.  She continues on Topamax 200 mg daily.  She reports that her headaches have increased but she is under a lot of stress.  Reports that her mother-in-law recently moved in with her.  She has dementia and they are taking care of her for now.  She feels that most of her headaches are tension headaches.  She does use Tylenol to treat her headaches.  She does not wish to start any new medication.  In the past she has tried Imitrex and Zomig as an abortive therapy with good benefit.  HISTORY 09/02/18:  Ms. Maria Becker is a 59 year old female with a history of basilar migraine headaches.  She states that her headaches have increased recently but she contributes this to stress.  She states that she was out of work for 8 weeks due to COVID-19.  She states that she has not started back at work but is been very stressful.  She states that she is having to take Tylenol approximately 4 times a week for the headache.  She reports that she responds well to Tylenol.  She feels that her headaches will get better as her stress level starts to decrease.  She joins me today for virtual visit.  REVIEW OF SYSTEMS: Out of a complete 14 system review of symptoms, the patient complains only of the following symptoms, and all other reviewed systems are negative.  See HPI  ALLERGIES: Allergies  Allergen Reactions  . Gluten Meal   . Other     Dairy cow milk    HOME MEDICATIONS: Outpatient Medications Prior to Visit  Medication Sig Dispense Refill  . b complex vitamins tablet Take 1 tablet by mouth daily.    . Cholecalciferol ([employer] VIT D 5000 HIGH-POTENCY PO) Take 1 tablet by mouth daily.     . Cyanocobalamin (VITAMIN B 12 PO) Take 2,500 mcg by mouth daily.     Marland Kitchen liothyronine (CYTOMEL) 25 MCG tablet Take 25 mcg by mouth daily before breakfast.     . Multiple Vitamins-Minerals (MULTIVITAMIN WITH MINERALS) tablet Take 1 tablet by mouth daily.    . Omega-3 Fatty Acids (OMEGA-3 FISH OIL PO) Take 1 capsule by mouth 2 (two) times daily. OMEGA PURE 820    . topiramate (TOPAMAX) 100 MG tablet Take 2 tablets (200 mg total) by mouth at bedtime. 180 tablet 3  . Estradiol (IMVEXXY MAINTENANCE PACK) 10 MCG INST Place 10 mcg vaginally 2 (two) times daily.    . predniSONE (DELTASONE) 5 MG tablet Begin taking 6 tablets daily, taper by one tablet daily until off the medication. 21 tablet 0  . vitamin E 400 UNIT capsule Take 400 Units by mouth daily.     Facility-Administered Medications Prior to Visit  Medication Dose Route Frequency Provider Last Rate Last Admin  . gadopentetate dimeglumine (MAGNEVIST) injection 20 mL  20 mL Intravenous Once PRN York Spaniel, MD        PAST MEDICAL HISTORY: Past Medical History:  Diagnosis Date  . 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 CAD (coronary artery disease)    a. cath 07/2015: 30-50% mid-LAD stenosis but otherwise normal cors  . Fibromyalgia   . GERD (gastroesophageal reflux disease)   . Headache   . Hyperlipidemia    a.  not currently on statin therapy  . Hypertension     PAST SURGICAL HISTORY: Past Surgical History:  Procedure Laterality Date  . ABDOMINAL HYSTERECTOMY    . BACK SURGERY    . CARDIAC CATHETERIZATION N/A 07/20/2015   Procedure: Left Heart Cath and Coronary Angiography;  Surgeon: Belva Crome, MD;  Location: [facility];  Service: Cardiovascular;  Laterality: N/A;  . L4-L5 fusion      FAMILY HISTORY: Family History  Problem Relation Age of Onset  . COPD Mother   . Heart disease Mother   . Diabetes Mother   . Aneurysm Mother   . Melanoma Father   . Diabetes Father   . Diabetes Sister   . Diabetes Brother   . Heart attack Brother   . Kidney disease Brother   . Cancer - Colon  Maternal Aunt   . Kidney disease Maternal Aunt   . Diabetes Maternal Aunt   . Hypothyroidism Paternal Aunt   . Diabetes Maternal Grandmother   . Heart disease Maternal Grandmother   . Diabetes Maternal Grandfather   . Heart disease Maternal Grandfather   . Diabetes Paternal Grandmother   . Heart disease Paternal Grandmother   . Diabetes Paternal Grandfather   . Heart disease Paternal Grandfather   . Aneurysm Maternal Uncle   . Aneurysm Cousin     SOCIAL HISTORY: Social History   Socioeconomic History  . Marital status: Married    Spouse name: Not on file  . Number of children: 2  . Years of education: 32  . Highest education level: Not on file  Occupational History  . Occupation: self-employed  Tobacco Use  . Smoking status: Never Smoker  . Smokeless tobacco: Never Used  Substance and Sexual Activity  . Alcohol use: Yes    Comment: occasional  . Drug use: No  . Sexual activity: Yes    Birth control/protection: Other-see comments  Other Topics Concern  . Not on file  Social History Narrative   Patient drinks 1 soda daily.   Patient is right handed.    Social Determinants of Health   Financial Resource Strain:   . Difficulty of Paying Living Expenses:   Food Insecurity:   . Worried About Charity fundraiser in the Last Year:   . Arboriculturist in the Last Year:   Transportation Needs:   . Film/video editor (Medical):   Marland Kitchen Lack of Transportation (Non-Medical):   Physical Activity:   . Days of Exercise per Week:   . Minutes of Exercise per Session:   Stress:   . Feeling of Stress :   Social Connections:   . Frequency of Communication with Friends and Family:   . Frequency of Social Gatherings with Friends and Family:   . Attends Religious Services:   . Active Member of Clubs or Organizations:   . Attends Archivist Meetings:   Marland Kitchen Marital Status:   Intimate Partner Violence:   . Fear of Current or Ex-Partner:   . Emotionally Abused:   Marland Kitchen  Physically Abused:   . Sexually Abused:       PHYSICAL EXAM  Vitals:   05/28/19 0746  BP: 109/69  Pulse: 68  Temp: (!) 97.4 F (36.3 C)  Weight: 165 lb (74.8 kg)  Height: 5\' 6"  (1.676 m)   Body mass index is 26.63 kg/m.  Generalized: Well developed, in no acute distress   Neurological examination  Mentation: Alert oriented to time, place, history taking.  Follows all commands speech and language fluent Cranial nerve II-XII: Pupils were equal round reactive to light. Extraocular movements were full, visual field were full on confrontational test.  Head turning and shoulder shrug  were normal and symmetric. Motor: The motor testing reveals 5 over 5 strength of all 4 extremities. Good symmetric motor tone is noted throughout.  Sensory: Sensory testing is intact to soft touch on all 4 extremities. No evidence of extinction is noted.  Coordination: Cerebellar testing reveals good finger-nose-finger and heel-to-shin bilaterally.  Gait and station: Gait is normal. Reflexes: Deep tendon reflexes are symmetric and normal bilaterally.   DIAGNOSTIC DATA (LABS, IMAGING, TESTING) - I reviewed patient records, labs, notes, testing and imaging myself where available.  Lab Results  Component Value Date   WBC 4.8 07/20/2015   HGB 13.0 07/20/2015   HCT 39.8 07/20/2015   MCV 88.6 07/20/2015   PLT 216 07/20/2015      Component Value Date/Time   NA 143 07/20/2015 1046   NA 143 02/01/2015 1144   K 3.5 07/20/2015 1046   CL 109 07/20/2015 1046   CO2 23 07/20/2015 1046   GLUCOSE 90 07/20/2015 1046   BUN 14 07/20/2015 1046   BUN 13 02/01/2015 1144   CREATININE 1.00 07/20/2015 1046   CALCIUM 9.8 07/20/2015 1046   PROT 7.0 07/20/2015 1046   ALBUMIN 3.8 07/20/2015 1046   AST 21 07/20/2015 1046   ALT 16 07/20/2015 1046   ALKPHOS 61 07/20/2015 1046   BILITOT 0.5 07/20/2015 1046   GFRNONAA >60 07/20/2015 1046   GFRAA >60 07/20/2015 1046   Lab Results  Component Value Date   CHOL (H)  02/28/2007    237        ATP III CLASSIFICATION:  <200     mg/dL   Desirable  564-332  mg/dL   Borderline High  >=951    mg/dL   High   HDL 71 88/41/6606   LDLCALC (H) 02/28/2007    159        Total Cholesterol/HDL:CHD Risk Coronary Heart Disease Risk Table                     Men   Women  1/2 Average Risk   3.4   3.3   TRIG 37 02/28/2007   CHOLHDL 3.3 02/28/2007   Lab Results  Component Value Date   HGBA1C 5.6 07/20/2015   No results found for: VITAMINB12 Lab Results  Component Value Date   TSH 3.071 07/20/2015      ASSESSMENT AND PLAN 59 y.o. 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 here with:  1.  Migraine headaches  -Continue Topamax 200 mg daily -Advised if headache frequency increases she should let us know -Follow-up in 1 year or sooner if needed   I spent 15 minutes of face-to-face and non-face-to-face time with patient.  This included previsit chart review, lab review, study review, order entry, electronic health record documentation, patient education.  Butch Penny, MSN, NP-C 05/28/2019, 7:56 AM [facility] [address], [address] [place], [place] [address] [phone]

## 2019-05-29 ENCOUNTER — Ambulatory Visit: Payer: 59 | Attending: Internal Medicine

## 2019-05-29 DIAGNOSIS — Z23 Encounter for immunization: Secondary | ICD-10-CM

## 2019-05-29 NOTE — Progress Notes (Signed)
I have read the note, and I agree with the clinical assessment and plan.  Colbert Curenton K Marquelle Balow   

## 2019-05-29 NOTE — Progress Notes (Signed)
   Covid-19 Vaccination Clinic  Name:  Maria Becker    MRN: [id number] DOB: 21-Jan-1961  05/29/2019  [name] was observed post Covid-19 immunization for 15 minutes without incident. She was provided with Vaccine Information Sheet and instruction to access the V-Safe system.   [name] was instructed to call 911 with any severe reactions post vaccine: Marland Kitchen Difficulty breathing  . Swelling of face and throat  . A fast heartbeat  . A bad rash all over body  . Dizziness and weakness   Immunizations Administered    Name Date Dose VIS Date Route   Pfizer COVID-19 Vaccine 05/29/2019 12:41 PM 0.3 mL 02/21/2019 Intramuscular   Manufacturer: [employer], [employer]   Lot: [id number]   NDC: [address]-5

## 2019-06-24 ENCOUNTER — Ambulatory Visit: Payer: 59 | Attending: Internal Medicine

## 2019-06-24 DIAGNOSIS — Z23 Encounter for immunization: Secondary | ICD-10-CM

## 2019-06-24 NOTE — Progress Notes (Signed)
   Covid-19 Vaccination Clinic  Name:  Maria Becker    MRN: [id number] DOB: 10-26-1960  06/24/2019  [name] was observed post Covid-19 immunization for 15 minutes without incident. She was provided with Vaccine Information Sheet and instruction to access the V-Safe system.   [name] was instructed to call 911 with any severe reactions post vaccine: Marland Kitchen Difficulty breathing  . Swelling of face and throat  . A fast heartbeat  . A bad rash all over body  . Dizziness and weakness   Immunizations Administered    Name Date Dose VIS Date Route   Pfizer COVID-19 Vaccine 06/24/2019 10:59 AM 0.3 mL 02/21/2019 Intramuscular   Manufacturer: [employer], [employer]   Lot: [id number]   NDC: [address]-7

## 2019-10-12 ENCOUNTER — Other Ambulatory Visit: Payer: Self-pay | Admitting: Adult Health

## 2020-03-26 ENCOUNTER — Telehealth: Payer: Self-pay | Admitting: Neurology

## 2020-03-26 DIAGNOSIS — G43009 Migraine without aura, not intractable, without status migrainosus: Secondary | ICD-10-CM

## 2020-03-26 MED ORDER — SUMATRIPTAN SUCCINATE 50 MG PO TABS: ORAL_TABLET | ORAL | 0 refills | Status: DC

## 2020-03-26 MED ORDER — ONDANSETRON HCL 4 MG PO TABS: 4.0000 mg | ORAL_TABLET | Freq: Three times a day (TID) | ORAL | 0 refills | Status: DC | PRN

## 2020-03-26 NOTE — Telephone Encounter (Signed)
Patient called after-hours call center with regards to a severe migraine.  I was able to talk to the patient on the phone.  She reports that she has had a severe migraine since 3 AM this morning.  She typically is under good control with Topamax 200 mg daily which she has been taking regularly, has not missed a dose.  She believes that it could be the change in weather that aggravated her migraine.  Upon further questioning regarding acute medication option that she has had, she reported that she does not have anything for acute migraines by prescription.  She has taken 2 Tylenol pills and 2 Advil thus far.  She was encouraged to take Tylenol after 6 hours again, and she could take up to 600 mg of Advil every 8 hours.  Upon chart review, she had tried Imitrex before.  She reports that she had no problems taking it in the past, no side effects reported.  She also reports nausea and historical medication indicates that she has been on Zofran before.  She was advised that I could send a prescription for Imitrex to her pharmacy as well as Zofran generic for nausea.  She was advised to have someone pick up her prescription, she indicated that her husband could do so.  She was advised to call the office if she needed a sooner than scheduled appointment, currently scheduled for March 2022 with Megan.  She was under the impression that she had an appointment coming up in the next few weeks.  She was encouraged to call back during office hours if she needed a sooner appointment.  She demonstrated understanding and agreement with the plan.

## 2020-05-31 ENCOUNTER — Ambulatory Visit (INDEPENDENT_AMBULATORY_CARE_PROVIDER_SITE_OTHER): Payer: 59 | Admitting: Adult Health

## 2020-05-31 ENCOUNTER — Ambulatory Visit: Payer: 59 | Admitting: Adult Health

## 2020-05-31 ENCOUNTER — Other Ambulatory Visit: Payer: Self-pay

## 2020-05-31 VITALS — BP 122/84 | HR 74 | Ht 66.0 in | Wt 190.0 lb

## 2020-05-31 DIAGNOSIS — G43009 Migraine without aura, not intractable, without status migrainosus: Secondary | ICD-10-CM | POA: Diagnosis not present

## 2020-05-31 DIAGNOSIS — G43109 Migraine with aura, not intractable, without status migrainosus: Secondary | ICD-10-CM | POA: Diagnosis not present

## 2020-05-31 MED ORDER — TOPIRAMATE 50 MG PO TABS: 50.0000 mg | ORAL_TABLET | Freq: Every day | ORAL | 3 refills | Status: DC

## 2020-05-31 NOTE — Progress Notes (Signed)
PATIENT: Maria Becker DOB: 01-Sep-1960  REASON FOR VISIT: follow up HISTORY FROM: patient  HISTORY OF PRESENT ILLNESS: Today 05/31/20:  Maria Becker is a 60 year old female with a history of Basilar Migraine. She returns today.  She reports that she stopped the Topamax a while back.  States that she was having blurry vision however she reports that the blurry vision has been chronic prior to starting Topamax.  She states that since she is been off the Topamax the blurry vision has not improved at all.  Her headaches have become daily.  She reports that she has a dull throbbing headache most days.  She is only had 2 severe migraines since January which required Imitrex.  She is interested in restarting Topamax.  She states that the blurry vision typically comes around her menstrual cycle.  She has not discussed this with her ophthalmologist nor her PCP.  05/28/19: Maria Becker is a 60 year old female with a history of basilar migraine headaches.  She returns today for follow-up.  She continues on Topamax 200 mg daily.  She reports that her headaches have increased but she is under a lot of stress.  Reports that her mother-in-law recently moved in with her.  She has dementia and they are taking care of her for now.  She feels that most of her headaches are tension headaches.  She does use Tylenol to treat her headaches.  She does not wish to start any new medication.  In the past she has tried Imitrex and Zomig as an abortive therapy with good benefit.  HISTORY 09/02/18:  Maria Becker is a 60 year old female with a history of basilar migraine headaches.  She states that her headaches have increased recently but she contributes this to stress.  She states that she was out of work for 8 weeks due to COVID-19.  She states that she has not started back at work but is been very stressful.  She states that she is having to take Tylenol approximately 4 times a week for the headache.  She reports that  she responds well to Tylenol.  She feels that her headaches will get better as her stress level starts to decrease.  She joins me today for virtual visit.  REVIEW OF SYSTEMS: Out of a complete 14 system review of symptoms, the patient complains only of the following symptoms, and all other reviewed systems are negative.  See HPI  ALLERGIES: Allergies  Allergen Reactions  . Gluten Meal   . Other     Dairy cow milk    HOME MEDICATIONS: Outpatient Medications Prior to Visit  Medication Sig Dispense Refill  . b complex vitamins tablet Take 1 tablet by mouth daily.    . Cholecalciferol ([employer]S VIT D 5000 HIGH-POTENCY PO) Take 1 tablet by mouth daily.     Marland Kitchen liothyronine (CYTOMEL) 5 MCG tablet Take 25 mcg by mouth in the morning and at bedtime.    . Multiple Vitamins-Minerals (MULTIVITAMIN WITH MINERALS) tablet Take 1 tablet by mouth daily.    . Omega-3 Fatty Acids (OMEGA-3 FISH OIL PO) Take 1 capsule by mouth 2 (two) times daily. OMEGA PURE 820    . SUMAtriptan (IMITREX) 50 MG tablet Take for acute migraine. May repeat in 2 hours if headache persists. No more than 2 pills/24 h, no more than 3 pills/week. 10 tablet 0  . Cyanocobalamin (VITAMIN B 12 PO) Take 2,500 mcg by mouth daily.    . ondansetron (ZOFRAN) 4 MG tablet  Take 1 tablet (4 mg total) by mouth every 8 (eight) hours as needed for nausea or vomiting. 20 tablet 0  . topiramate (TOPAMAX) 100 MG tablet TAKE 2 TABLETS (200 MG TOTAL) BY MOUTH AT BEDTIME. (Patient not taking: Reported on 05/31/2020) 180 tablet 3   Facility-Administered Medications Prior to Visit  Medication Dose Route Frequency Provider Last Rate Last Admin  . gadopentetate dimeglumine (MAGNEVIST) injection 20 mL  20 mL Intravenous Once PRN York Spaniel, MD        PAST MEDICAL HISTORY: Past Medical History:  Diagnosis Date  . 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 CAD (coronary artery disease)    a. cath 07/2015: 30-50% mid-LAD stenosis but otherwise normal cors  .  Fibromyalgia   . GERD (gastroesophageal reflux disease)   . Headache   . Hyperlipidemia    a. not currently on statin therapy  . Hypertension     PAST SURGICAL HISTORY: Past Surgical History:  Procedure Laterality Date  . ABDOMINAL HYSTERECTOMY    . BACK SURGERY    . CARDIAC CATHETERIZATION N/A 07/20/2015   Procedure: Left Heart Cath and Coronary Angiography;  Surgeon: Lyn Records, MD;  Location: [facility];  Service: Cardiovascular;  Laterality: N/A;  . L4-L5 fusion      FAMILY HISTORY: Family History  Problem Relation Age of Onset  . COPD Mother   . Heart disease Mother   . Diabetes Mother   . Aneurysm Mother   . Melanoma Father   . Diabetes Father   . Diabetes Sister   . Diabetes Brother   . Heart attack Brother   . Kidney disease Brother   . Cancer - Colon Maternal Aunt   . Kidney disease Maternal Aunt   . Diabetes Maternal Aunt   . Hypothyroidism Paternal Aunt   . Diabetes Maternal Grandmother   . Heart disease Maternal Grandmother   . Diabetes Maternal Grandfather   . Heart disease Maternal Grandfather   . Diabetes Paternal Grandmother   . Heart disease Paternal Grandmother   . Diabetes Paternal Grandfather   . Heart disease Paternal Grandfather   . Aneurysm Maternal Uncle   . Aneurysm Cousin     SOCIAL HISTORY: Social History   Socioeconomic History  . Marital status: Married    Spouse name: Not on file  . Number of children: 2  . Years of education: 44  . Highest education level: Not on file  Occupational History  . Occupation: self-employed  Tobacco Use  . Smoking status: Never Smoker  . Smokeless tobacco: Never Used  Substance and Sexual Activity  . Alcohol use: Yes    Comment: occasional  . Drug use: No  . Sexual activity: Yes    Birth control/protection: Other-see comments  Other Topics Concern  . Not on file  Social History Narrative   Patient drinks 1 soda daily.   Patient is right handed.    Social Determinants of Health    Financial Resource Strain: Not on file  Food Insecurity: Not on file  Transportation Needs: Not on file  Physical Activity: Not on file  Stress: Not on file  Social Connections: Not on file  Intimate Partner Violence: Not on file      PHYSICAL EXAM  Vitals:   05/31/20 0822  BP: 122/84  Pulse: 74  Weight: 190 lb (86.2 kg)  Height: 5\' 6"  (1.676 m)   Body mass index is 30.67 kg/m.  Generalized: Well developed, in no acute distress  Neurological examination  Mentation: Alert oriented to time, place, history taking. Follows all commands speech and language fluent Cranial nerve II-XII: Pupils were equal round reactive to light. Extraocular movements were full, visual field were full on confrontational test.  Head turning and shoulder shrug  were normal and symmetric. Motor: The motor testing reveals 5 over 5 strength of all 4 extremities. Good symmetric motor tone is noted throughout.  Sensory: Sensory testing is intact to soft touch on all 4 extremities. No evidence of extinction is noted.  Coordination: Cerebellar testing reveals good finger-nose-finger and heel-to-shin bilaterally.  Gait and station: Gait is normal. Reflexes: Deep tendon reflexes are symmetric and normal bilaterally.   DIAGNOSTIC DATA (LABS, IMAGING, TESTING) - I reviewed patient records, labs, notes, testing and imaging myself where available.  Lab Results  Component Value Date   WBC 4.8 07/20/2015   HGB 13.0 07/20/2015   HCT 39.8 07/20/2015   MCV 88.6 07/20/2015   PLT 216 07/20/2015      Component Value Date/Time   NA 143 07/20/2015 1046   NA 143 02/01/2015 1144   K 3.5 07/20/2015 1046   CL 109 07/20/2015 1046   CO2 23 07/20/2015 1046   GLUCOSE 90 07/20/2015 1046   BUN 14 07/20/2015 1046   BUN 13 02/01/2015 1144   CREATININE 1.00 07/20/2015 1046   CALCIUM 9.8 07/20/2015 1046   PROT 7.0 07/20/2015 1046   ALBUMIN 3.8 07/20/2015 1046   AST 21 07/20/2015 1046   ALT 16 07/20/2015 1046    ALKPHOS 61 07/20/2015 1046   BILITOT 0.5 07/20/2015 1046   GFRNONAA >60 07/20/2015 1046   GFRAA >60 07/20/2015 1046   Lab Results  Component Value Date   CHOL (H) 02/28/2007    237        ATP III CLASSIFICATION:  <200     mg/dL   Desirable  154-008  mg/dL   Borderline High  >=676    mg/dL   High   HDL 71 19/50/9326   LDLCALC (H) 02/28/2007    159        Total Cholesterol/HDL:CHD Risk Coronary Heart Disease Risk Table                     Men   Women  1/2 Average Risk   3.4   3.3   TRIG 37 02/28/2007   CHOLHDL 3.3 02/28/2007   Lab Results  Component Value Date   HGBA1C 5.6 07/20/2015   No results found for: VITAMINB12 Lab Results  Component Value Date   TSH 3.071 07/20/2015      ASSESSMENT AND PLAN 60 y.o. 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 here with:  1.  Migraine headaches  -Restart Topamax 50 mg at bedtime. -Advised if headache frequency increases she should let us know -Follow-up with ophthalmology regarding blurry vision -Follow-up in 1 year or sooner if needed   I spent 25 minutes of face-to-face and non-face-to-face time with patient.  This included previsit chart review, lab review, study review, order entry, electronic health record documentation, patient education.  Butch Penny, MSN, NP-C 05/31/2020, 8:38 AM [facility] [address] [place], [place] [address] [phone]

## 2020-05-31 NOTE — Patient Instructions (Signed)
Your Plan:  Restart Topamax at 50 mg at bedtime If your symptoms worsen or you develop new symptoms please let us know.    Thank you for coming to see [place] at [facility]. I hope we have been able to provide you high quality care today.  You may receive a patient satisfaction survey over the next few weeks. We would appreciate your feedback and comments so that we may continue to improve ourselves and the health of our patients.

## 2020-06-01 NOTE — Progress Notes (Signed)
I have read the note, and I agree with the clinical assessment and plan.  Mandrell Vangilder K Genever Hentges   

## 2020-07-05 ENCOUNTER — Other Ambulatory Visit (HOSPITAL_COMMUNITY): Payer: Self-pay | Admitting: Student

## 2020-07-05 ENCOUNTER — Other Ambulatory Visit: Payer: Self-pay | Admitting: Student

## 2020-07-05 DIAGNOSIS — M25562 Pain in left knee: Secondary | ICD-10-CM

## 2020-07-10 ENCOUNTER — Ambulatory Visit (HOSPITAL_COMMUNITY)
Admission: RE | Admit: 2020-07-10 | Discharge: 2020-07-10 | Disposition: A | Discharge status: Home / Self Care | Payer: 59 | Source: Ambulatory Visit | Attending: Student | Admitting: Student

## 2020-07-10 ENCOUNTER — Other Ambulatory Visit: Payer: Self-pay

## 2020-07-10 DIAGNOSIS — M25562 Pain in left knee: Secondary | ICD-10-CM | POA: Insufficient documentation

## 2020-09-01 ENCOUNTER — Encounter: Payer: Self-pay | Admitting: Internal Medicine

## 2020-09-10 ENCOUNTER — Other Ambulatory Visit (HOSPITAL_COMMUNITY): Payer: Self-pay | Admitting: Family Medicine

## 2020-09-10 DIAGNOSIS — Z13820 Encounter for screening for osteoporosis: Secondary | ICD-10-CM

## 2020-09-10 DIAGNOSIS — Z1231 Encounter for screening mammogram for malignant neoplasm of breast: Secondary | ICD-10-CM

## 2020-09-10 DIAGNOSIS — R1010 Upper abdominal pain, unspecified: Secondary | ICD-10-CM

## 2020-09-16 ENCOUNTER — Ambulatory Visit (HOSPITAL_COMMUNITY)
Admission: RE | Admit: 2020-09-16 | Discharge: 2020-09-16 | Disposition: A | Discharge status: Home / Self Care | Payer: 59 | Source: Ambulatory Visit | Attending: Family Medicine | Admitting: Family Medicine

## 2020-09-16 DIAGNOSIS — R1010 Upper abdominal pain, unspecified: Secondary | ICD-10-CM | POA: Insufficient documentation

## 2020-09-20 ENCOUNTER — Other Ambulatory Visit (HOSPITAL_COMMUNITY): Payer: Self-pay | Admitting: Family Medicine

## 2020-09-20 ENCOUNTER — Other Ambulatory Visit: Payer: Self-pay | Admitting: Family Medicine

## 2020-09-27 ENCOUNTER — Ambulatory Visit (HOSPITAL_COMMUNITY)
Admission: RE | Admit: 2020-09-27 | Discharge: 2020-09-27 | Disposition: A | Discharge status: Home / Self Care | Payer: 59 | Source: Ambulatory Visit | Attending: Family Medicine | Admitting: Family Medicine

## 2020-09-27 ENCOUNTER — Other Ambulatory Visit: Payer: Self-pay

## 2020-09-27 DIAGNOSIS — Z1231 Encounter for screening mammogram for malignant neoplasm of breast: Secondary | ICD-10-CM

## 2020-09-27 DIAGNOSIS — Z13820 Encounter for screening for osteoporosis: Secondary | ICD-10-CM | POA: Diagnosis not present

## 2020-10-05 ENCOUNTER — Other Ambulatory Visit (HOSPITAL_COMMUNITY): Payer: Self-pay | Admitting: Family Medicine

## 2020-10-05 DIAGNOSIS — R1010 Upper abdominal pain, unspecified: Secondary | ICD-10-CM

## 2020-10-18 ENCOUNTER — Ambulatory Visit (HOSPITAL_BASED_OUTPATIENT_CLINIC_OR_DEPARTMENT_OTHER): Payer: 59

## 2020-10-19 ENCOUNTER — Encounter (HOSPITAL_COMMUNITY): Payer: Self-pay

## 2020-10-19 NOTE — Patient Instructions (Signed)
DUE TO COVID-19 ONLY ONE VISITOR IS ALLOWED TO COME WITH YOU AND STAY IN THE WAITING ROOM ONLY DURING PRE OP AND PROCEDURE.   **NO VISITORS ARE ALLOWED IN THE SHORT STAY AREA OR RECOVERY ROOM!!**  IF YOU WILL BE ADMITTED INTO THE HOSPITAL YOU ARE ALLOWED ONLY TWO SUPPORT PEOPLE DURING VISITATION HOURS ONLY (10AM -8PM)   The support person(s) may change daily. The support person(s) must pass our screening, gel in and out, and wear a mask at all times, including in the patient's room. Patients must also wear a mask when staff or their support person are in the room.  No visitors under the age of 62. Any visitor under the age of 62 must be accompanied by an adult.    COVID SWAB TESTING MUST BE COMPLETED ON:  Friday, 10-22-20 between the hours of 8 and 3  **MUST PRESENT COMPLETED FORM AT TESTING SITE**    [address] [place] (backside of the building)  You are not required to quarantine, however you are required to wear a well-fitted mask when you are out and around people not in your household.  Hand Hygiene often Do NOT share personal items Notify your provider if you are in close contact with someone who has COVID or you develop fever 100.4 or greater, new onset of sneezing, cough, sore throat, shortness of breath or body aches.         Your procedure is scheduled on:  Tuesday, 10-26-20   [facility]  [facility]   Report to admitting at 9:15 AM   Call this number if you have problems the morning of surgery [phone]   Do not eat food :After Midnight.   May have liquids until 8:40 AM day of surgery  CLEAR LIQUID DIET  Foods Allowed                                                                     Foods Excluded  Water, Black Coffee and tea, regular and decaf            liquids that you cannot  Plain Jell-O in any flavor  (No red)                                  see through such as: Fruit ices (not with fruit pulp)                                       milk, soups, orange juice              Iced Popsicles (No red)                                      All solid food                                   Apple juices Sports drinks like Gatorade (No red)  Lightly seasoned clear broth or consume(fat free) Sugar, honey syrup     Complete one Ensure drink the morning of surgery at  8:40 AM the day of surgery.        The day of surgery:  Drink ONE (1) Pre-Surgery Clear Ensure the morning of surgery. Drink in one sitting. Do not sip.  This drink was given to you during your hospital  pre-op appointment visit. Nothing else to drink after completing the Pre-Surgery Clear Ensure .          If you have questions, please contact your surgeon's office.     Oral Hygiene is also important to reduce your risk of infection.                                    Remember - BRUSH YOUR TEETH THE MORNING OF SURGERY WITH YOUR REGULAR TOOTHPASTE   Do NOT smoke after Midnight   Take these medicines the morning of surgery with A SIP OF WATER:  Liothyronine (Cytomel)                        You may not have any metal on your body including  jewelry, and body piercing             Do not wear make-up, lotions, powders, perfumes/ or deodorant  Do not wear nail polish including gel and S&S, artificial/acrylic nails, or any other type of covering on natural nails including finger and toenails. If you have artificial nails, gel coating, etc. that needs to be removed by a nail salon please have this removed prior to surgery or surgery may need to be canceled/ delayed if the surgeon/ anesthesia feels like they are unable to be safely monitored.   Do not shave  48 hours prior to surgery.              Do not bring valuables to the hospital. [facility] IS NOT RESPONSIBLE   FOR VALUABLES.   Contacts, dentures or bridgework may not be worn into surgery.   Bring small overnight bag day of surgery.   Special Instructions: Bring a copy of your healthcare  power of attorney and living will documents         the day of surgery if you haven't scanned them in before.  Please read over the following fact sheets you were given: IF YOU HAVE QUESTIONS ABOUT YOUR PRE OP INSTRUCTIONS PLEASE CALL [phone]   [facility] - Preparing for Surgery Before surgery, you can play an important role.  Because skin is not sterile, your skin needs to be as free of germs as possible.  You can reduce the number of germs on your skin by washing with CHG (chlorahexidine gluconate) soap before surgery.  CHG is an antiseptic cleaner which kills germs and bonds with the skin to continue killing germs even after washing. Please DO NOT use if you have an allergy to CHG or antibacterial soaps.  If your skin becomes reddened/irritated stop using the CHG and inform your nurse when you arrive at Short Stay. Do not shave (including legs and underarms) for at least 48 hours prior to the first CHG shower.  You may shave your face/neck.  Please follow these instructions carefully:  1.  Shower with CHG Soap the night before surgery and the  morning of surgery.  2.  If you choose  to wash your hair, wash your hair first as usual with your normal  shampoo.  3.  After you shampoo, rinse your hair and body thoroughly to remove the shampoo.                             4.  Use CHG as you would any other liquid soap.  You can apply chg directly to the skin and wash.  Gently with a scrungie or clean washcloth.  5.  Apply the CHG Soap to your body ONLY FROM THE NECK DOWN.   Do   not use on face/ open                           Wound or open sores. Avoid contact with eyes, ears mouth and   genitals (private parts).                       Wash face,  Genitals (private parts) with your normal soap.             6.  Wash thoroughly, paying special attention to the area where your    surgery  will be performed.  7.  Thoroughly rinse your body with warm water from the neck down.  8.  DO NOT shower/wash  with your normal soap after using and rinsing off the CHG Soap.                9.  Pat yourself dry with a clean towel.            10.  Wear clean pajamas.            11.  Place clean sheets on your bed the night of your first shower and do not  sleep with pets. Day of Surgery : Do not apply any lotions/deodorants the morning of surgery.  Please wear clean clothes to the hospital/surgery center.  FAILURE TO FOLLOW THESE INSTRUCTIONS MAY RESULT IN THE CANCELLATION OF YOUR SURGERY  PATIENT SIGNATURE_________________________________  NURSE SIGNATURE__________________________________  ________________________________________________________________________   Rogelia Mire  An incentive spirometer is a tool that can help keep your lungs clear and active. This tool measures how well you are filling your lungs with each breath. Taking long deep breaths may help reverse or decrease the chance of developing breathing (pulmonary) problems (especially infection) following: A long period of time when you are unable to move or be active. BEFORE THE PROCEDURE  If the spirometer includes an indicator to show your best effort, your nurse or respiratory therapist will set it to a desired goal. If possible, sit up straight or lean slightly forward. Try not to slouch. Hold the incentive spirometer in an upright position. INSTRUCTIONS FOR USE  Sit on the edge of your bed if possible, or sit up as far as you can in bed or on a chair. Hold the incentive spirometer in an upright position. Breathe out normally. Place the mouthpiece in your mouth and seal your lips tightly around it. Breathe in slowly and as deeply as possible, raising the piston or the ball toward the top of the column. Hold your breath for 3-5 seconds or for as long as possible. Allow the piston or ball to fall to the bottom of the column. Remove the mouthpiece from your mouth and breathe out normally. Rest for a few seconds and  repeat Steps 1 through  7 at least 10 times every 1-2 hours when you are awake. Take your time and take a few normal breaths between deep breaths. The spirometer may include an indicator to show your best effort. Use the indicator as a goal to work toward during each repetition. After each set of 10 deep breaths, practice coughing to be sure your lungs are clear. If you have an incision (the cut made at the time of surgery), support your incision when coughing by placing a pillow or rolled up towels firmly against it. Once you are able to get out of bed, walk around indoors and cough well. You may stop using the incentive spirometer when instructed by your caregiver.  RISKS AND COMPLICATIONS Take your time so you do not get dizzy or light-headed. If you are in pain, you may need to take or ask for pain medication before doing incentive spirometry. It is harder to take a deep breath if you are having pain. AFTER USE Rest and breathe slowly and easily. It can be helpful to keep track of a log of your progress. Your caregiver can provide you with a simple table to help with this. If you are using the spirometer at home, follow these instructions: SEEK MEDICAL CARE IF:  You are having difficultly using the spirometer. You have trouble using the spirometer as often as instructed. Your pain medication is not giving enough relief while using the spirometer. You develop fever of 100.5 F (38.1 C) or higher. SEEK IMMEDIATE MEDICAL CARE IF:  You cough up bloody sputum that had not been present before. You develop fever of 102 F (38.9 C) or greater. You develop worsening pain at or near the incision site. MAKE SURE YOU:  Understand these instructions. Will watch your condition. Will get help right away if you are not doing well or get worse. Document Released: 07/10/2006 Document Revised: 05/22/2011 Document Reviewed: 09/10/2006 ExitCare Patient Information 2014 ExitCare,  [place].   ________________________________________________________________________  WHAT IS A BLOOD TRANSFUSION? Blood Transfusion Information  A transfusion is the replacement of blood or some of its parts. Blood is made up of multiple cells which provide different functions. Red blood cells carry oxygen and are used for blood loss replacement. White blood cells fight against infection. Platelets control bleeding. Plasma helps clot blood. Other blood products are available for specialized needs, such as hemophilia or other clotting disorders. BEFORE THE TRANSFUSION  Who gives blood for transfusions?  Healthy volunteers who are fully evaluated to make sure their blood is safe. This is blood bank blood. Transfusion therapy is the safest it has ever been in the practice of medicine. Before blood is taken from a donor, a complete history is taken to make sure that person has no history of diseases nor engages in risky social behavior (examples are intravenous drug use or sexual activity with multiple partners). The donor's travel history is screened to minimize risk of transmitting infections, such as malaria. The donated blood is tested for signs of infectious diseases, such as HIV and hepatitis. The blood is then tested to be sure it is compatible with you in order to minimize the chance of a transfusion reaction. If you or a relative donates blood, this is often done in anticipation of surgery and is not appropriate for emergency situations. It takes many days to process the donated blood. RISKS AND COMPLICATIONS Although transfusion therapy is very safe and saves many lives, the main dangers of transfusion include:  Getting an infectious disease. Developing a  transfusion reaction. This is an allergic reaction to something in the blood you were given. Every precaution is taken to prevent this. The decision to have a blood transfusion has been considered carefully by your caregiver before blood is  given. Blood is not given unless the benefits outweigh the risks. AFTER THE TRANSFUSION Right after receiving a blood transfusion, you will usually feel much better and more energetic. This is especially true if your red blood cells have gotten low (anemic). The transfusion raises the level of the red blood cells which carry oxygen, and this usually causes an energy increase. The nurse administering the transfusion will monitor you carefully for complications. HOME CARE INSTRUCTIONS  No special instructions are needed after a transfusion. You may find your energy is better. Speak with your caregiver about any limitations on activity for underlying diseases you may have. SEEK MEDICAL CARE IF:  Your condition is not improving after your transfusion. You develop redness or irritation at the intravenous (IV) site. SEEK IMMEDIATE MEDICAL CARE IF:  Any of the following symptoms occur over the next 12 hours: Shaking chills. You have a temperature by mouth above 102 F (38.9 C), not controlled by medicine. Chest, back, or muscle pain. People around you feel you are not acting correctly or are confused. Shortness of breath or difficulty breathing. Dizziness and fainting. You get a rash or develop hives. You have a decrease in urine output. Your urine turns a dark color or changes to pink, red, or brown. Any of the following symptoms occur over the next 10 days: You have a temperature by mouth above 102 F (38.9 C), not controlled by medicine. Shortness of breath. Weakness after normal activity. The white part of the eye turns yellow (jaundice). You have a decrease in the amount of urine or are urinating less often. Your urine turns a dark color or changes to pink, red, or brown. Document Released: 02/25/2000 Document Revised: 05/22/2011 Document Reviewed: 10/14/2007 [facility] Patient Information 2014 [place], [place].  _______________________________________________________________________

## 2020-10-19 NOTE — Progress Notes (Addendum)
COVID Vaccine Completed: Yes x2 Date COVID Vaccine completed: 05-29-19 06-24-19 Has received booster: COVID vaccine manufacturer: Pfizer      Date of COVID positive in last 90 days:  PCP - Nita Sells, MD Cardiologist - Verdis Prime, MD.  Last OV 2017   Medical clearance on chart dated 10-14-20 bu Leone Payor  Chest x-ray -  EKG -  Stress Test - 07-19-15 Epic ECHO - 05-04-15 Epic Cardiac Cath - 07-20-15 Epic Pacemaker/ICD device last checked: Spinal Cord Stimulator: Non-Telemetry Monitor - 04-27-15 Epic CT Heart - 11-08-2004 Epic  Sleep Study -  CPAP -   Fasting Blood Sugar -  Checks Blood Sugar _____ times a day  Blood Thinner Instructions: Aspirin Instructions: Last Dose:  Activity level:  Can go up a flight of stairs and perform activities of daily living without stopping and without symptoms of chest pain or shortness of breath.   Able to exercise without symptoms  Unable to go up a flight of stairs without symptoms of      Anesthesia review:  CAD, Angina at rest,  History of abnormal stress test.  HTN  Patient denies shortness of breath, fever, cough and chest pain at PAT appointment   Patient verbalized understanding of instructions that were given to them at the PAT appointment. Patient was also instructed that they will need to review over the PAT instructions again at home before surgery.

## 2020-10-22 ENCOUNTER — Encounter (HOSPITAL_COMMUNITY)
Admission: RE | Admit: 2020-10-22 | Discharge: 2020-10-22 | Disposition: A | Discharge status: Home / Self Care | Payer: 59 | Source: Ambulatory Visit | Attending: Anesthesiology | Admitting: Anesthesiology

## 2020-10-22 HISTORY — DX: Insomnia, unspecified: G47.00

## 2020-10-26 ENCOUNTER — Other Ambulatory Visit (HOSPITAL_COMMUNITY): Payer: Self-pay | Admitting: Family Medicine

## 2020-10-26 ENCOUNTER — Encounter (HOSPITAL_COMMUNITY): Admission: RE | Payer: Self-pay | Source: Home / Self Care

## 2020-10-26 ENCOUNTER — Ambulatory Visit (HOSPITAL_COMMUNITY): Admission: RE | Admit: 2020-10-26 | Payer: 59 | Source: Home / Self Care | Admitting: Orthopedic Surgery

## 2020-10-26 ENCOUNTER — Other Ambulatory Visit: Payer: Self-pay | Admitting: Family Medicine

## 2020-10-26 DIAGNOSIS — R1010 Upper abdominal pain, unspecified: Secondary | ICD-10-CM

## 2020-10-26 SURGERY — ARTHROPLASTY, KNEE, TOTAL
Anesthesia: Spinal | Site: Knee | Laterality: Left

## 2020-11-05 ENCOUNTER — Emergency Department (HOSPITAL_BASED_OUTPATIENT_CLINIC_OR_DEPARTMENT_OTHER)
Admission: EM | Admit: 2020-11-05 | Discharge: 2020-11-05 | Disposition: A | Discharge status: Home / Self Care | Payer: 59 | Attending: Emergency Medicine | Admitting: Emergency Medicine

## 2020-11-05 ENCOUNTER — Emergency Department (HOSPITAL_BASED_OUTPATIENT_CLINIC_OR_DEPARTMENT_OTHER): Payer: 59

## 2020-11-05 ENCOUNTER — Encounter (HOSPITAL_BASED_OUTPATIENT_CLINIC_OR_DEPARTMENT_OTHER): Payer: Self-pay

## 2020-11-05 DIAGNOSIS — I2510 Atherosclerotic heart disease of native coronary artery without angina pectoris: Secondary | ICD-10-CM | POA: Insufficient documentation

## 2020-11-05 DIAGNOSIS — M7989 Other specified soft tissue disorders: Secondary | ICD-10-CM | POA: Diagnosis present

## 2020-11-05 DIAGNOSIS — I10 Essential (primary) hypertension: Secondary | ICD-10-CM | POA: Insufficient documentation

## 2020-11-05 NOTE — ED Triage Notes (Signed)
Pt is present for left lower leg pain that has been going on the last few weeks but has been worse the last few days. Pt has also had increase of swelling of both lower legs. Pt was at physical therapy this afternoon and they sent her here to be evaluated for a DVT. 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 Pt was supposed to have left knee surgery but had to go to PT first before they approved surgery.

## 2020-11-05 NOTE — ED Provider Notes (Signed)
[facility] Provider Note: Lowella Dell, MD, FACEP  CSN: [id number] MRN: [id number] ARRIVAL: 11/05/20 at 2008 ROOM: DB006/DB006   CHIEF COMPLAINT  Leg Pain   HISTORY OF PRESENT ILLNESS  11/05/20 11:03 PM Maria Becker is a 60 y.o. female who has had pain in her left knee for about 5 months.  For about 3 months she has had swelling of the left lower extremity.  She was scheduled for knee replacement 4 days ago but at the last minute her insurance company refused to pay for the operation until she had first trialed physical therapy.  When she went to physical therapy she was told that the leg swelling was concerning for a DVT and they would not perform therapy on her until she was evaluated for a DVT.  Thus she was sent here for a Doppler ultrasound.  She is having pain in that leg which she rates as a 6 out of 10, aching in nature.  It is worse with movement of the knee or with palpation of the leg.  The swelling of the leg is improved in the mornings and worsens throughout the day while she stands (she is a Interior and spatial designer).   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 cath 07/2015: 30-50% mid-LAD stenosis but otherwise normal cors   Fibromyalgia    GERD (gastroesophageal reflux disease)    Headache    Hyperlipidemia    a. not currently on statin therapy   Hypertension    Insomnia     Past Surgical History:  Procedure Laterality Date   ABDOMINAL HYSTERECTOMY     BACK SURGERY     CARDIAC CATHETERIZATION N/A 07/20/2015   Procedure: Left Heart Cath and Coronary Angiography;  Surgeon: Lyn Records, MD;  Location: [facility] INVASIVE CV LAB;  Service: Cardiovascular;  Laterality: N/A;   L4-L5 fusion      Family History  Problem Relation Age of Onset   COPD Mother    Heart disease Mother    Diabetes Mother    Aneurysm Mother    Melanoma Father    Diabetes Father    Diabetes Sister    Diabetes Brother     Heart attack Brother    Kidney disease Brother    Cancer - Colon Maternal Aunt    Kidney disease Maternal Aunt    Diabetes Maternal Aunt    Hypothyroidism Paternal Aunt    Diabetes Maternal Grandmother    Heart disease Maternal Grandmother    Diabetes Maternal Grandfather    Heart disease Maternal Grandfather    Diabetes Paternal Grandmother    Heart disease Paternal Grandmother    Diabetes Paternal Grandfather    Heart disease Paternal Grandfather    Aneurysm Maternal Uncle    Aneurysm Cousin     Social History   Tobacco Use   Smoking status: Never   Smokeless tobacco: Never  Substance Use Topics   Alcohol use: Yes    Comment: occasional   Drug use: No    Prior to Admission medications   Medication Sig Start Date End Date Taking? Authorizing Provider  b complex vitamins tablet Take 1 tablet by mouth daily.    [provider]  Cholecalciferol ([employer] VIT D 5000 HIGH-POTENCY PO) Take 1 tablet by mouth daily.     [provider]  liothyronine (CYTOMEL) 5 MCG tablet Take 25 mcg by mouth in the morning and at bedtime.  06/07/15   [provider]  Multiple Vitamins-Minerals (MULTIVITAMIN WITH MINERALS) tablet Take 1 tablet by mouth daily.    [provider]  Omega-3 Fatty Acids (OMEGA-3 FISH OIL PO) Take 1 capsule by mouth 2 (two) times daily. OMEGA PURE 820    [provider]  SUMAtriptan (IMITREX) 50 MG tablet Take for acute migraine. May repeat in 2 hours if headache persists. No more than 2 pills/24 h, no more than 3 pills/week. 03/26/20   Huston Foley, MD  topiramate (TOPAMAX) 50 MG tablet Take 1 tablet (50 mg total) by mouth at bedtime. 05/31/20   Butch Penny, NP    Allergies Gluten meal and Other   REVIEW OF SYSTEMS  Negative except as noted here or in the History of Present Illness.   PHYSICAL EXAMINATION  Initial Vital Signs Blood pressure 116/60, pulse 67, temperature 98.6 F (37 C), temperature source Oral, resp. rate  17, height 5\' 5"  (1.651 m), weight 88.5 kg, SpO2 97 %.  Examination General: Well-developed, well-nourished female in no acute distress; appearance consistent with age of record HENT: normocephalic; atraumatic Eyes: pupils equal, round and reactive to light; extraocular muscles intact Neck: supple Heart: regular rate and rhythm Lungs: clear to auscultation bilaterally Abdomen: soft; nondistended; nontender; bowel sounds present Extremities: No deformity; +1 edema of left lower extremity with generalized tenderness but no erythema or warmth; pulses normal Neurologic: Awake, alert and oriented; motor function intact in all extremities and symmetric; no facial droop Skin: Warm and dry Psychiatric: Normal mood and affect   RESULTS  Summary of this visit's results, reviewed and interpreted by myself:   EKG Interpretation  Date/Time:    Ventricular Rate:    PR Interval:    QRS Duration:   QT Interval:    QTC Calculation:   R Axis:     Text Interpretation:         Laboratory Studies: No results found for this or any previous visit (from the past 24 hour(s)). Imaging Studies: Venous Img Lower Unilateral Left  Result Date: 11/05/2020 CLINICAL DATA:  Left lower leg pain swelling EXAM: Left LOWER EXTREMITY VENOUS DOPPLER ULTRASOUND TECHNIQUE: Gray-scale sonography with compression, as well as color and duplex ultrasound, were performed to evaluate the deep venous system(s) from the level of the common femoral vein through the popliteal and proximal calf veins. COMPARISON:  None. FINDINGS: VENOUS Normal compressibility of the common femoral, superficial femoral, and popliteal veins, as well as the visualized calf veins. Visualized portions of profunda femoral vein and great saphenous vein unremarkable. No filling defects to suggest DVT on grayscale or color Doppler imaging. Doppler waveforms show normal direction of venous flow, normal respiratory plasticity and response to augmentation.  Limited views of the contralateral common femoral vein are unremarkable. OTHER None. Limitations: none IMPRESSION: Negative. Electronically Signed   By: 11/07/2020 M.D.   On: 11/05/2020 21:35    ED COURSE and MDM  Nursing notes, initial and subsequent vitals signs, including pulse oximetry, reviewed and interpreted by myself.  Vitals:   11/05/20 2017 11/05/20 2018 11/05/20 2157 11/05/20 2300  BP: (!) 112/93  116/60 (!) 112/53  Pulse: 77  67 63  Resp: 17  17 18   Temp: 98.6 F (37 C)     TempSrc: Oral     SpO2: 99%  97% 97%  Weight:  88.5 kg    Height:  5\' 5"  (1.651 m)     Medications - No data to display  Patient advised of reassuring  Doppler ultrasound.  The cause of her lower extremity swelling is unclear but as it is a chronic problem she was advised that further work-up would need to be done as an outpatient.  PROCEDURES  Procedures   ED DIAGNOSES     ICD-10-CM   1. Left leg swelling  M79.89          Genecis Veley, Jonny Ruiz, MD 11/05/20 2317

## 2020-11-05 NOTE — Discharge Instructions (Addendum)
No evidence of deep vein thrombosis on Doppler ultrasound of the left lower extremity.

## 2020-11-05 NOTE — ED Notes (Signed)
Pt verbalizes understanding of discharge instructions. Opportunity for questioning and answers were provided. Armand removed by staff, pt discharged from ED to home. Educated to f/u with Ortho 

## 2020-11-05 NOTE — ED Notes (Addendum)
Been having pain and swelling to left leg since March.  States went to physical therapy today and they told her to get checked for blood clot.  States pain  from ankle up to knee back of leg and to outer aspect of leg.

## 2020-11-08 ENCOUNTER — Other Ambulatory Visit (HOSPITAL_COMMUNITY): Payer: Self-pay | Admitting: Orthopedic Surgery

## 2020-11-08 ENCOUNTER — Encounter (HOSPITAL_COMMUNITY): Payer: 59

## 2020-11-08 DIAGNOSIS — M79605 Pain in left leg: Secondary | ICD-10-CM

## 2020-11-29 ENCOUNTER — Ambulatory Visit (HOSPITAL_COMMUNITY)
Admission: RE | Admit: 2020-11-29 | Discharge: 2020-11-29 | Disposition: A | Discharge status: Home / Self Care | Payer: 59 | Source: Ambulatory Visit | Attending: Family Medicine | Admitting: Family Medicine

## 2020-11-29 ENCOUNTER — Other Ambulatory Visit: Payer: Self-pay

## 2020-11-29 DIAGNOSIS — R1010 Upper abdominal pain, unspecified: Secondary | ICD-10-CM | POA: Diagnosis not present

## 2020-11-29 LAB — POCT I-STAT CREATININE: Creatinine, Ser: 1 mg/dL (ref 0.44–1.00)

## 2020-11-29 MED ORDER — IOHEXOL 350 MG/ML SOLN
75.0000 mL | Freq: Once | INTRAVENOUS | Status: AC | PRN
  Administered 2020-11-29: 75 mL via INTRAVENOUS

## 2021-01-05 ENCOUNTER — Telehealth: Payer: Self-pay

## 2021-01-05 NOTE — Telephone Encounter (Signed)
Pt just called cancel appt for tomorrow please. She cannot come. Pt has to work. Wants to be rescheduled for Nov. 14th, 15th, 28th, or 29th.

## 2021-01-13 ENCOUNTER — Ambulatory Visit: Payer: 59 | Admitting: Gastroenterology

## 2021-01-25 ENCOUNTER — Ambulatory Visit: Payer: 59 | Admitting: Gastroenterology

## 2021-01-25 NOTE — Progress Notes (Deleted)
Primary Care Physician:  Benita Stabile, MD  Primary Gastroenterologist:    No chief complaint on file.   HPI:  Maria Becker is a 60 y.o. female here at the request of Dr. Margo Aye for screening colonoscopy.  Current Outpatient Medications  Medication Sig Dispense Refill   b complex vitamins tablet Take 1 tablet by mouth daily.     Cholecalciferol ([employer] VIT D 5000 HIGH-POTENCY PO) Take 1 tablet by mouth daily.      liothyronine (CYTOMEL) 5 MCG tablet Take 25 mcg by mouth in the morning and at bedtime.     Multiple Vitamins-Minerals (MULTIVITAMIN WITH MINERALS) tablet Take 1 tablet by mouth daily.     Omega-3 Fatty Acids (OMEGA-3 FISH OIL PO) Take 1 capsule by mouth 2 (two) times daily. OMEGA PURE 820     SUMAtriptan (IMITREX) 50 MG tablet Take for acute migraine. May repeat in 2 hours if headache persists. No more than 2 pills/24 h, no more than 3 pills/week. 10 tablet 0   topiramate (TOPAMAX) 50 MG tablet Take 1 tablet (50 mg total) by mouth at bedtime. 90 tablet 3   No current facility-administered medications for this visit.   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 cath 07/2015: 30-50% mid-LAD stenosis but otherwise normal cors   Fibromyalgia    GERD (gastroesophageal reflux disease)    Headache    Hyperlipidemia    a. not currently on statin therapy   Hypertension    Insomnia     Past Surgical History:  Procedure Laterality Date   ABDOMINAL HYSTERECTOMY     BACK SURGERY     CARDIAC CATHETERIZATION N/A 07/20/2015   Procedure: Left Heart  Cath and Coronary Angiography;  Surgeon: Lyn Records, MD;  Location: [facility] INVASIVE CV LAB;  Service: Cardiovascular;  Laterality: N/A;   L4-L5 fusion      Family History  Problem Relation Age of Onset   COPD Mother    Heart disease Mother    Diabetes Mother    Aneurysm Mother    Melanoma Father    Diabetes Father    Diabetes Sister    Diabetes Brother    Heart attack Brother    Kidney disease Brother    Cancer - Colon Maternal Aunt    Kidney disease Maternal Aunt    Diabetes Maternal Aunt    Hypothyroidism Paternal Aunt    Diabetes Maternal Grandmother    Heart disease Maternal Grandmother    Diabetes Maternal Grandfather    Heart disease Maternal Grandfather    Diabetes Paternal Grandmother    Heart disease Paternal Grandmother    Diabetes Paternal Grandfather    Heart disease Paternal Grandfather    Aneurysm Maternal Uncle    Aneurysm Cousin     Social History   Socioeconomic History   Marital status: Married    Spouse name: Not on file   Number of children: 2   Years of education: 13   Highest education level: Not on file  Occupational History  Occupation: self-employed  Tobacco Use   Smoking status: Never   Smokeless tobacco: Never  Substance and Sexual Activity   Alcohol use: Yes    Comment: occasional   Drug use: No   Sexual activity: Yes    Birth control/protection: Other-see comments  Other Topics Concern   Not on file  Social History Narrative   Patient drinks 1 soda daily.   Patient is right handed.    Social Determinants of Health   Financial Resource Strain: Not on file  Food Insecurity: Not on file  Transportation Needs: Not on file  Physical Activity: Not on file  Stress: Not on file  Social Connections: Not on file  Intimate Partner Violence: Not on file      ROS:  General: Negative for anorexia, weight loss, fever, chills, fatigue, weakness. Eyes: Negative for vision changes.  ENT: Negative for hoarseness, difficulty swallowing  , nasal congestion. CV: Negative for chest pain, angina, palpitations, dyspnea on exertion, peripheral edema.  Respiratory: Negative for dyspnea at rest, dyspnea on exertion, cough, sputum, wheezing.  GI: See history of present illness. GU:  Negative for dysuria, hematuria, urinary incontinence, urinary frequency, nocturnal urination.  MS: Negative for joint pain, low back pain.  Derm: Negative for rash or itching.  Neuro: Negative for weakness, abnormal sensation, seizure, frequent headaches, memory loss, confusion.  Psych: Negative for anxiety, depression, suicidal ideation, hallucinations.  Endo: Negative for unusual weight change.  Heme: Negative for bruising or bleeding. Allergy: Negative for rash or hives.    Physical Examination:  There were no vitals taken for this visit.   General: Well-nourished, well-developed in no acute distress.  Head: Normocephalic, atraumatic.   Eyes: Conjunctiva pink, no icterus. Mouth: Oropharyngeal mucosa moist and pink , no lesions erythema or exudate. Neck: Supple without thyromegaly, masses, or lymphadenopathy.  Lungs: Clear to auscultation bilaterally.  Heart: Regular rate and rhythm, no murmurs rubs or gallops.  Abdomen: Bowel sounds are normal, nontender, nondistended, no hepatosplenomegaly or masses, no abdominal bruits or    hernia , no rebound or guarding.   Rectal: *** Extremities: No lower extremity edema. No clubbing or deformities.  Neuro: Alert and oriented x 4 , grossly normal neurologically.  Skin: Warm and dry, no rash or jaundice.   Psych: Alert and cooperative, normal mood and affect.  Labs: ***  Imaging Studies: No results found.   Assessment:       Plan:

## 2021-03-02 ENCOUNTER — Other Ambulatory Visit: Payer: Self-pay | Admitting: Adult Health

## 2021-03-15 ENCOUNTER — Other Ambulatory Visit: Payer: Self-pay | Admitting: Adult Health

## 2021-03-18 ENCOUNTER — Other Ambulatory Visit (HOSPITAL_COMMUNITY): Payer: Self-pay | Admitting: Family Medicine

## 2021-03-18 DIAGNOSIS — M25569 Pain in unspecified knee: Secondary | ICD-10-CM | POA: Diagnosis not present

## 2021-03-18 DIAGNOSIS — R0600 Dyspnea, unspecified: Secondary | ICD-10-CM | POA: Diagnosis not present

## 2021-03-18 DIAGNOSIS — E039 Hypothyroidism, unspecified: Secondary | ICD-10-CM | POA: Diagnosis not present

## 2021-03-18 DIAGNOSIS — R000 Tachycardia, unspecified: Secondary | ICD-10-CM | POA: Diagnosis not present

## 2021-03-22 ENCOUNTER — Other Ambulatory Visit (HOSPITAL_COMMUNITY): Payer: Self-pay | Admitting: Radiology

## 2021-03-22 DIAGNOSIS — R0600 Dyspnea, unspecified: Secondary | ICD-10-CM

## 2021-03-29 ENCOUNTER — Other Ambulatory Visit (HOSPITAL_COMMUNITY)
Admission: RE | Admit: 2021-03-29 | Discharge: 2021-03-29 | Disposition: A | Discharge status: Home / Self Care | Payer: BC Managed Care – PPO | Source: Ambulatory Visit | Attending: Internal Medicine | Admitting: Internal Medicine

## 2021-03-29 DIAGNOSIS — Z8249 Family history of ischemic heart disease and other diseases of the circulatory system: Secondary | ICD-10-CM | POA: Diagnosis not present

## 2021-03-29 DIAGNOSIS — Z20822 Contact with and (suspected) exposure to covid-19: Secondary | ICD-10-CM | POA: Insufficient documentation

## 2021-03-29 DIAGNOSIS — Z01812 Encounter for preprocedural laboratory examination: Secondary | ICD-10-CM | POA: Insufficient documentation

## 2021-03-29 DIAGNOSIS — I208 Other forms of angina pectoris: Secondary | ICD-10-CM

## 2021-03-29 DIAGNOSIS — G43109 Migraine with aura, not intractable, without status migrainosus: Secondary | ICD-10-CM

## 2021-03-29 DIAGNOSIS — E785 Hyperlipidemia, unspecified: Secondary | ICD-10-CM

## 2021-03-29 DIAGNOSIS — R9439 Abnormal result of other cardiovascular function study: Secondary | ICD-10-CM

## 2021-03-29 LAB — SARS CORONAVIRUS 2 (TAT 6-24 HRS): SARS Coronavirus 2: NEGATIVE

## 2021-03-31 NOTE — Progress Notes (Signed)
° ° °Chief Complaint  °Patient presents with  ° Follow-up  °  CAD  ° °History of Present Illness: 60 yo female with history of CAD, GERD, fibromyalgia, hyperlipidemia and HTN who is here today as a new consult for the evaluation of dyspnea and LE edema. She has been seen in the past in our office by Dr. Smith. Echo February 2017 with LVEF=55-60%, mild mitral regurgitation. Nuclear stress test May 2017 with possible anterior wall ischemia. Cardiac cath May 2017 with 30-50% mid LAD stenosis not felt to be flow limiting and otherwise normal coronary arteries. She tells me today that she has had ongoing dyspnea and lower extremity edema. She has gained 40 lbs over the past year. She feels chest pressure. She is having more headaches. She does not feel well since taking her last Covid booster.  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 cath 07/2015: 30-50% mid-LAD stenosis but otherwise normal cors  ° Fibromyalgia   ° GERD (gastroesophageal reflux disease)   ° Headache   ° Hyperlipidemia   ° a. not currently on statin therapy  ° Hypertension   ° Insomnia   ° ° °Past Surgical History:  °Procedure Laterality Date  ° ABDOMINAL HYSTERECTOMY    ° BACK SURGERY    ° CARDIAC CATHETERIZATION N/A 07/20/2015  ° Procedure: Left Heart Cath and Coronary Angiography;  Surgeon: Henry W Smith, MD;  Location: [facility] INVASIVE CV LAB;  Service: Cardiovascular;  Laterality: N/A;  ° L4-L5 fusion    ° ° °Current Outpatient Medications  °Medication Sig Dispense Refill  ° aspirin EC 81 MG tablet Take 1 tablet (81 mg total) by mouth daily. Swallow whole. 90 tablet 3  ° b complex vitamins tablet Take 1 tablet by mouth daily.    ° furosemide (LASIX) 40 MG tablet Take 1 tablet (40 mg total) by mouth daily. 90 tablet 3  ° liothyronine (CYTOMEL) 5 MCG tablet Take 25 mcg by mouth in the morning and at bedtime.    °  Multiple Vitamins-Minerals (MULTIVITAMIN WITH MINERALS) tablet Take 1 tablet by mouth daily.    ° Omega-3 Fatty Acids (OMEGA-3 FISH OIL PO) Take 1 capsule by mouth 2 (two) times daily. OMEGA PURE 820    ° rosuvastatin (CRESTOR) 10 MG tablet Take 1 tablet (10 mg total) by mouth daily. 90 tablet 3  ° SUMAtriptan (IMITREX) 50 MG tablet Take for acute migraine. May repeat in 2 hours if headache persists. No more than 2 pills/24 h, no more than 3 pills/week. 10 tablet 0  ° tamsulosin (FLOMAX) 0.4 MG CAPS capsule Take 0.4 mg by mouth as needed.    ° topiramate (TOPAMAX) 50 MG tablet Take 1 tablet (50 mg total) by mouth at bedtime. 90 tablet 3  ° Vitamin D, Ergocalciferol, (DRISDOL) 1.25 MG (50000 UNIT) CAPS capsule Take 50,000 Units by mouth every 7 (seven) days.    ° Cholecalciferol (CVS VIT D 5000 HIGH-POTENCY PO) Take 1 tablet by mouth daily.  (Patient not taking: Reported on 04/01/2021)    ° °Current Facility-Administered Medications  °Medication Dose Route Frequency Provider Last Rate Last Admin  ° sodium chloride flush (NS) 0.9 % injection 3 mL  3 mL Intravenous Q12H Beya Tipps D, MD      ° °Facility-Administered Medications Ordered in Other Visits  °Medication Dose Route Frequency Provider Last Rate Last   Admin   gadopentetate dimeglumine (MAGNEVIST) injection 20 mL  20 mL Intravenous Once PRN Kathrynn Ducking, MD        Allergies  Allergen Reactions   Gluten Meal    Other     Dairy cow milk    Social History   Socioeconomic History   Marital status: Married    Spouse name: Not on file   Number of children: 2   Years of education: 13   Highest education level: Not on file  Occupational History   Occupation: self-employed  Tobacco Use   Smoking status: Never   Smokeless tobacco: Never  Substance and Sexual Activity   Alcohol use: Yes    Comment: occasional   Drug use: No   Sexual activity: Yes    Birth control/protection: Other-see comments  Other Topics Concern   Not on file   Social History Narrative   Patient drinks 1 soda daily.   Patient is right handed.    Social Determinants of Health   Financial Resource Strain: Not on file  Food Insecurity: Not on file  Transportation Needs: Not on file  Physical Activity: Not on file  Stress: Not on file  Social Connections: Not on file  Intimate Partner Violence: Not on file    Family History  Problem Relation Age of Onset   COPD Mother    Heart attack Mother 24   Diabetes Mother    Aneurysm Mother    Melanoma Father    Diabetes Father    Diabetes Sister    Diabetes Brother    Heart attack Brother 81   Kidney disease Brother    Diabetes Maternal Grandmother    Heart disease Maternal Grandmother    Diabetes Maternal Grandfather    Heart disease Maternal Grandfather    Diabetes Paternal Grandmother    Heart disease Paternal Grandmother    Diabetes Paternal Grandfather    Heart disease Paternal Grandfather    Cancer - Colon Maternal Aunt    Kidney disease Maternal Aunt    Diabetes Maternal Aunt    Aneurysm Maternal Uncle    Hypothyroidism Paternal Aunt    Aneurysm Cousin     Review of Systems:  As stated in the HPI and otherwise negative.   BP 116/70    Pulse 94    Ht 5\' 5"  (1.651 m)    Wt 206 lb 6.4 oz (93.6 kg)    SpO2 95%    BMI 34.35 kg/m   Physical Examination: General: Well developed, well nourished, NAD  HEENT: OP clear, mucus membranes moist  SKIN: warm, dry. No rashes. Neuro: No focal deficits  Musculoskeletal: Muscle strength 5/5 all ext  Psychiatric: Mood and affect normal  Neck: No JVD, no carotid bruits, no thyromegaly, no lymphadenopathy.  Lungs:Clear bilaterally, no wheezes, rhonci, crackles Cardiovascular: Regular rate and rhythm. No murmurs, gallops or rubs. Abdomen:Soft. Bowel sounds present. Non-tender.  Extremities: No lower extremity edema. Pulses are 2 + in the bilateral DP/PT.  EKG:  EKG is ordered today. The ekg ordered today demonstrates sinus, Diffuse T wave  flattening  Echo February 2017:  - Left ventricle: The cavity size was normal. Wall thickness was    normal. Systolic function was normal. The estimated ejection    fraction was in the range of 55% to 60%. Wall motion was normal;    there were no regional wall motion abnormalities. Doppler    parameters are consistent with abnormal left ventricular    relaxation (grade 1 diastolic  dysfunction).  - Mitral valve: There was mild regurgitation.   Recent Labs: 11/29/2020: Creatinine, Ser 1.00   Lipid Panel  LDL 137 in September 2022    Wt Readings from Last 3 Encounters:  04/01/21 206 lb 6.4 oz (93.6 kg)  11/05/20 195 lb (88.5 kg)  05/31/20 190 lb (86.2 kg)     Assessment and Plan:   1. CAD with unstable angina: She is having more chest pressure and dyspnea. Known to have moderate mid LAD stenosis by cath in 2017. She had an abnormal nuclear stress test before that cath that showed possible anterior wall ischemia. I do not think another stress test would be helpful. Will arrange right and left heart cath at [facility] 04/07/21 at 9am.  I have reviewed the risks, indications, and alternatives to cardiac catheterization, possible angioplasty, and stenting with the patient. Risks include but are not limited to bleeding, infection, vascular injury, stroke, myocardial infection, arrhythmia, kidney injury, radiation-related injury in the case of prolonged fluoroscopy use, emergency cardiac surgery, and death. The patient understands the risks of serious complication is 1-2 in 123XX123 with diagnostic cardiac cath and 1-2% or less with angioplasty/stenting. Will start ASA 81 mg daily, Crestor 10 mg daily. BMET, CBC today  2. Chronic diastolic CHF: Weight is up. Some LE edema. Start Lasix 40 mg daily   3. Mitral regurgitation: Mild by echo in 2017. Repeat echo now.    Labs/ tests ordered today include:   Orders Placed This Encounter  Procedures   Basic Metabolic Panel (BMET)   CBC   EKG 12-Lead    ECHOCARDIOGRAM COMPLETE     Disposition:   F/U with me or office APP 4-6 weeks.    Signed, Lauree Chandler, MD 04/01/2021 1:31 PM    [facility] [place], [place], [place]  [address] Phone: [phone]; Fax: [phone]

## 2021-03-31 NOTE — H&P (View-Only) (Signed)
Chief Complaint  Patient presents with   Follow-up    CAD   History of Present Illness: 61 yo female with history of CAD, GERD, fibromyalgia, hyperlipidemia and HTN who is here today as a new consult for the evaluation of dyspnea and LE edema. She has been seen in the past in our office by Dr. Katrinka Blazing. Echo February 2017 with LVEF=55-60%, mild mitral regurgitation. Nuclear stress test May 2017 with possible anterior wall ischemia. Cardiac cath May 2017 with 30-50% mid LAD stenosis not felt to be flow limiting and otherwise normal coronary arteries. She tells me today that she has had ongoing dyspnea and lower extremity edema. She has gained 40 lbs over the past year. She feels chest pressure. She is having more headaches. She does not feel well since taking her last Covid booster.   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 cath 07/2015: 30-50% mid-LAD stenosis but otherwise normal cors   Fibromyalgia    GERD (gastroesophageal reflux disease)    Headache    Hyperlipidemia    a. not currently on statin therapy   Hypertension    Insomnia     Past Surgical History:  Procedure Laterality Date   ABDOMINAL HYSTERECTOMY     BACK SURGERY     CARDIAC CATHETERIZATION N/A 07/20/2015   Procedure: Left Heart Cath and Coronary Angiography;  Surgeon: Lyn Records, MD;  Location: [facility] INVASIVE CV LAB;  Service: Cardiovascular;  Laterality: N/A;   L4-L5 fusion      Current Outpatient Medications  Medication Sig Dispense Refill   aspirin EC 81 MG tablet Take 1 tablet (81 mg total) by mouth daily. Swallow whole. 90 tablet 3   b complex vitamins tablet Take 1 tablet by mouth daily.     furosemide (LASIX) 40 MG tablet Take 1 tablet (40 mg total) by mouth daily. 90 tablet 3   liothyronine (CYTOMEL) 5 MCG tablet Take 25 mcg by mouth in the morning and at bedtime.      Multiple Vitamins-Minerals (MULTIVITAMIN WITH MINERALS) tablet Take 1 tablet by mouth daily.     Omega-3 Fatty Acids (OMEGA-3 FISH OIL PO) Take 1 capsule by mouth 2 (two) times daily. OMEGA PURE 820     rosuvastatin (CRESTOR) 10 MG tablet Take 1 tablet (10 mg total) by mouth daily. 90 tablet 3   SUMAtriptan (IMITREX) 50 MG tablet Take for acute migraine. May repeat in 2 hours if headache persists. No more than 2 pills/24 h, no more than 3 pills/week. 10 tablet 0   tamsulosin (FLOMAX) 0.4 MG CAPS capsule Take 0.4 mg by mouth as needed.     topiramate (TOPAMAX) 50 MG tablet Take 1 tablet (50 mg total) by mouth at bedtime. 90 tablet 3   Vitamin D, Ergocalciferol, (DRISDOL) 1.25 MG (50000 UNIT) CAPS capsule Take 50,000 Units by mouth every 7 (seven) days.     Cholecalciferol (CVS VIT D 5000 HIGH-POTENCY PO) Take 1 tablet by mouth daily.  (Patient not taking: Reported on 04/01/2021)     Current Facility-Administered Medications  Medication Dose Route Frequency Provider Last Rate Last Admin   sodium chloride flush (NS) 0.9 % injection 3 mL  3 mL Intravenous Q12H Kathleene Hazel, MD       Facility-Administered Medications Ordered in Other Visits  Medication Dose Route Frequency Provider Last Rate Last  Admin   gadopentetate dimeglumine (MAGNEVIST) injection 20 mL  20 mL Intravenous Once PRN Kathrynn Ducking, MD        Allergies  Allergen Reactions   Gluten Meal    Other     Dairy cow milk    Social History   Socioeconomic History   Marital status: Married    Spouse name: Not on file   Number of children: 2   Years of education: 13   Highest education level: Not on file  Occupational History   Occupation: self-employed  Tobacco Use   Smoking status: Never   Smokeless tobacco: Never  Substance and Sexual Activity   Alcohol use: Yes    Comment: occasional   Drug use: No   Sexual activity: Yes    Birth control/protection: Other-see comments  Other Topics Concern   Not on file   Social History Narrative   Patient drinks 1 soda daily.   Patient is right handed.    Social Determinants of Health   Financial Resource Strain: Not on file  Food Insecurity: Not on file  Transportation Needs: Not on file  Physical Activity: Not on file  Stress: Not on file  Social Connections: Not on file  Intimate Partner Violence: Not on file    Family History  Problem Relation Age of Onset   COPD Mother    Heart attack Mother 24   Diabetes Mother    Aneurysm Mother    Melanoma Father    Diabetes Father    Diabetes Sister    Diabetes Brother    Heart attack Brother 81   Kidney disease Brother    Diabetes Maternal Grandmother    Heart disease Maternal Grandmother    Diabetes Maternal Grandfather    Heart disease Maternal Grandfather    Diabetes Paternal Grandmother    Heart disease Paternal Grandmother    Diabetes Paternal Grandfather    Heart disease Paternal Grandfather    Cancer - Colon Maternal Aunt    Kidney disease Maternal Aunt    Diabetes Maternal Aunt    Aneurysm Maternal Uncle    Hypothyroidism Paternal Aunt    Aneurysm Cousin     Review of Systems:  As stated in the HPI and otherwise negative.   BP 116/70    Pulse 94    Ht 5\' 5"  (1.651 m)    Wt 206 lb 6.4 oz (93.6 kg)    SpO2 95%    BMI 34.35 kg/m   Physical Examination: General: Well developed, well nourished, NAD  HEENT: OP clear, mucus membranes moist  SKIN: warm, dry. No rashes. Neuro: No focal deficits  Musculoskeletal: Muscle strength 5/5 all ext  Psychiatric: Mood and affect normal  Neck: No JVD, no carotid bruits, no thyromegaly, no lymphadenopathy.  Lungs:Clear bilaterally, no wheezes, rhonci, crackles Cardiovascular: Regular rate and rhythm. No murmurs, gallops or rubs. Abdomen:Soft. Bowel sounds present. Non-tender.  Extremities: No lower extremity edema. Pulses are 2 + in the bilateral DP/PT.  EKG:  EKG is ordered today. The ekg ordered today demonstrates sinus, Diffuse T wave  flattening  Echo February 2017:  - Left ventricle: The cavity size was normal. Wall thickness was    normal. Systolic function was normal. The estimated ejection    fraction was in the range of 55% to 60%. Wall motion was normal;    there were no regional wall motion abnormalities. Doppler    parameters are consistent with abnormal left ventricular    relaxation (grade 1 diastolic  dysfunction).  - Mitral valve: There was mild regurgitation.   Recent Labs: 11/29/2020: Creatinine, Ser 1.00   Lipid Panel  LDL 137 in September 2022    Wt Readings from Last 3 Encounters:  04/01/21 206 lb 6.4 oz (93.6 kg)  11/05/20 195 lb (88.5 kg)  05/31/20 190 lb (86.2 kg)     Assessment and Plan:   1. CAD with unstable angina: She is having more chest pressure and dyspnea. Known to have moderate mid LAD stenosis by cath in 2017. She had an abnormal nuclear stress test before that cath that showed possible anterior wall ischemia. I do not think another stress test would be helpful. Will arrange right and left heart cath at [facility] 04/07/21 at 9am.  I have reviewed the risks, indications, and alternatives to cardiac catheterization, possible angioplasty, and stenting with the patient. Risks include but are not limited to bleeding, infection, vascular injury, stroke, myocardial infection, arrhythmia, kidney injury, radiation-related injury in the case of prolonged fluoroscopy use, emergency cardiac surgery, and death. The patient understands the risks of serious complication is 1-2 in 123XX123 with diagnostic cardiac cath and 1-2% or less with angioplasty/stenting. Will start ASA 81 mg daily, Crestor 10 mg daily. BMET, CBC today  2. Chronic diastolic CHF: Weight is up. Some LE edema. Start Lasix 40 mg daily   3. Mitral regurgitation: Mild by echo in 2017. Repeat echo now.    Labs/ tests ordered today include:   Orders Placed This Encounter  Procedures   Basic Metabolic Panel (BMET)   CBC   EKG 12-Lead    ECHOCARDIOGRAM COMPLETE     Disposition:   F/U with me or office APP 4-6 weeks.    Signed, Lauree Chandler, MD 04/01/2021 1:31 PM    [facility] [place], [place], [place]  [address] Phone: [phone]; Fax: [phone]

## 2021-04-01 ENCOUNTER — Other Ambulatory Visit: Payer: Self-pay

## 2021-04-01 ENCOUNTER — Encounter: Payer: Self-pay | Admitting: Cardiovascular Disease

## 2021-04-01 ENCOUNTER — Ambulatory Visit: Payer: BC Managed Care – PPO | Admitting: Cardiovascular Disease

## 2021-04-01 ENCOUNTER — Ambulatory Visit (HOSPITAL_COMMUNITY)
Admission: RE | Admit: 2021-04-01 | Discharge: 2021-04-01 | Disposition: A | Discharge status: Home / Self Care | Payer: BC Managed Care – PPO | Source: Ambulatory Visit | Attending: Internal Medicine | Admitting: Internal Medicine

## 2021-04-01 VITALS — BP 116/70 | HR 94 | Ht 65.0 in | Wt 206.4 lb

## 2021-04-01 DIAGNOSIS — I340 Nonrheumatic mitral (valve) insufficiency: Secondary | ICD-10-CM | POA: Diagnosis not present

## 2021-04-01 DIAGNOSIS — I5032 Chronic diastolic (congestive) heart failure: Secondary | ICD-10-CM | POA: Diagnosis not present

## 2021-04-01 DIAGNOSIS — I25110 Atherosclerotic heart disease of native coronary artery with unstable angina pectoris: Secondary | ICD-10-CM | POA: Diagnosis not present

## 2021-04-01 DIAGNOSIS — R0600 Dyspnea, unspecified: Secondary | ICD-10-CM | POA: Diagnosis not present

## 2021-04-01 LAB — PULMONARY FUNCTION TEST
DL/VA % pred: 79 %
DL/VA: 3.27 ml/min/mmHg/L
DLCO unc % pred: 76 %
DLCO unc: 16.31 ml/min/mmHg
FEF 25-75 Post: 2.54 L/sec
FEF 25-75 Pre: 2.68 L/sec
FEF2575-%Change-Post: -4 %
FEF2575-%Pred-Post: 106 %
FEF2575-%Pred-Pre: 112 %
FEV1-%Change-Post: -1 %
FEV1-%Pred-Post: 104 %
FEV1-%Pred-Pre: 106 %
FEV1-Post: 2.83 L
FEV1-Pre: 2.87 L
FEV1FVC-%Change-Post: 7 %
FEV1FVC-%Pred-Pre: 100 %
FEV6-%Change-Post: -7 %
FEV6-%Pred-Post: 99 %
FEV6-%Pred-Pre: 107 %
FEV6-Post: 3.36 L
FEV6-Pre: 3.64 L
FEV6FVC-%Change-Post: 1 %
FEV6FVC-%Pred-Post: 103 %
FEV6FVC-%Pred-Pre: 102 %
FVC-%Change-Post: -8 %
FVC-%Pred-Post: 96 %
FVC-%Pred-Pre: 105 %
FVC-Post: 3.36 L
FVC-Pre: 3.68 L
Post FEV1/FVC ratio: 84 %
Post FEV6/FVC ratio: 100 %
Pre FEV1/FVC ratio: 78 %
Pre FEV6/FVC Ratio: 99 %
RV % pred: 105 %
RV: 2.24 L
TLC % pred: 110 %
TLC: 5.89 L

## 2021-04-01 MED ORDER — ALBUTEROL SULFATE (2.5 MG/3ML) 0.083% IN NEBU
2.5000 mg | INHALATION_SOLUTION | Freq: Once | RESPIRATORY_TRACT | Status: AC
  Administered 2021-04-01: 2.5 mg via RESPIRATORY_TRACT

## 2021-04-01 MED ORDER — ROSUVASTATIN CALCIUM 10 MG PO TABS: 10.0000 mg | ORAL_TABLET | Freq: Every day | ORAL | 3 refills | Status: DC

## 2021-04-01 MED ORDER — ASPIRIN EC 81 MG PO TBEC: 81.0000 mg | DELAYED_RELEASE_TABLET | Freq: Every day | ORAL | 3 refills | Status: DC

## 2021-04-01 MED ORDER — SODIUM CHLORIDE 0.9% FLUSH: 3.0000 mL | Freq: Two times a day (BID) | INTRAVENOUS | Status: DC

## 2021-04-01 MED ORDER — FUROSEMIDE 40 MG PO TABS: 40.0000 mg | ORAL_TABLET | Freq: Every day | ORAL | 3 refills | Status: DC

## 2021-04-01 NOTE — Patient Instructions (Signed)
Medication Instructions:  Your physician has recommended you make the following change in your medication:  1.) start furosemide (Lasix) 40 mg - take one tablet daily 2.) start rosuvastatin (Crestor) 10 mg - take one tablet daily 3.) start aspirin 81 mg - (EC)- one tablet daily  *If you need a refill on your cardiac medications before your next appointment, please call your pharmacy*   Lab Work: Today: BMET, CBC   Testing/Procedures: Your physician has requested that you have an echocardiogram. Echocardiography is a painless test that uses sound waves to create images of your heart. It provides your doctor with information about the size and shape of your heart and how well your hearts chambers and valves are working. This procedure takes approximately one hour. There are no restrictions for this procedure.  Your physician has requested that you have a cardiac catheterization. Cardiac catheterization is used to diagnose and/or treat various heart conditions. Doctors may recommend this procedure for a number of different reasons. The most common reason is to evaluate chest pain. Chest pain can be a symptom of coronary artery disease (CAD), and cardiac catheterization can show whether plaque is narrowing or blocking your hearts arteries. This procedure is also used to evaluate the valves, as well as measure the blood flow and oxygen levels in different parts of your heart. For further information please visit [email or web address] Please follow instruction sheet, as given.   Follow-Up: At [facility], you and your health needs are our priority.  As part of our continuing mission to provide you with exceptional heart care, we have created designated Provider Care Teams.  These Care Teams include your primary Cardiologist (physician) and Advanced Practice Providers (APPs -  Physician Assistants and Nurse Practitioners) who all work together to provide you with the care you need, when you need  it.  Your next appointment:   4-6 weeks after cath  The format for your next appointment:   In Person  Provider:   Verne Carrow, MD or an Advanced Practice Provider (NP, PA)   Other Instructions  [facility] CARDIOVASCULAR DIVISION [facility] ST OFFICE [address] [place] [address] Dept: [phone] Loc: [phone]  REGINNA SERMENO  04/01/2021  You are scheduled for a Cardiac Catheterization on Thursday, January 26 with Dr. Verne Carrow.  1. Please arrive at the [facility] (Main Entrance A) at [facility]: [address] [place], [place] [address] at 7:00 AM (This time is two hours before your procedure to ensure your preparation). Free valet parking service is available.   Special note: Every effort is made to have your procedure done on time. Please understand that emergencies sometimes delay scheduled procedures.  2. Diet: Do not eat solid foods after midnight.  The patient may have clear liquids until 5am upon the day of the procedure.  3. Labs: You will need to have blood drawn today.  You do not need to be fasting.  4. Medication instructions in preparation for your procedure:   Contrast Allergy: No  Do not take Lasix on day of procedure  On the morning of your procedure, take your ASPIRIN and any morning medicines NOT listed above.  You may use sips of water.  5. Plan for one night stay--bring personal belongings. 6. Bring a current list of your medications and current insurance cards. 7. You MUST have a responsible person to drive you home. 8. Someone MUST be with you the first 24 hours  after you arrive home or your discharge will be delayed. 9. Please wear clothes that are easy to get on and off and wear slip-on shoes.  Thank you for allowing [place] to care for you!   -- [facility]

## 2021-04-02 LAB — BASIC METABOLIC PANEL
BUN/Creatinine Ratio: 15 (ref 12–28)
BUN: 14 mg/dL (ref 8–27)
CO2: 22 mmol/L (ref 20–29)
Calcium: 9.6 mg/dL (ref 8.7–10.3)
Chloride: 106 mmol/L (ref 96–106)
Creatinine, Ser: 0.96 mg/dL (ref 0.57–1.00)
Glucose: 149 mg/dL — ABNORMAL HIGH (ref 70–99)
Potassium: 3.7 mmol/L (ref 3.5–5.2)
Sodium: 143 mmol/L (ref 134–144)
eGFR: 67 mL/min/{1.73_m2} (ref >59)

## 2021-04-02 LAB — CBC
Hematocrit: 42.4 % (ref 34.0–46.6)
Hemoglobin: 13.9 g/dL (ref 11.1–15.9)
MCH: 29.1 pg (ref 26.6–33.0)
MCHC: 32.8 g/dL (ref 31.5–35.7)
MCV: 89 fL (ref 79–97)
Platelets: 252 10*3/uL (ref 150–450)
RBC: 4.78 x10E6/uL (ref 3.77–5.28)
RDW: 12.6 % (ref 11.7–15.4)
WBC: 5.9 10*3/uL (ref 3.4–10.8)

## 2021-04-06 ENCOUNTER — Ambulatory Visit (HOSPITAL_COMMUNITY): Payer: BC Managed Care – PPO

## 2021-04-06 ENCOUNTER — Telehealth: Payer: Self-pay | Admitting: *Deleted

## 2021-04-06 NOTE — Telephone Encounter (Addendum)
Cardiac catheterization scheduled at [facility] for: Thursday April 07, 2021 9 AM Arrive [facility] Main Entrance A [facility]) at: 7 AM   Diet-no solid food after midnight prior to cath, clear liquids until 5 AM day of procedure.  Medication instructions for procedure: -Hold:  Lasix-AM of procedure  -Usual morning medications can be taken pre-cath with sips of water including aspirin 81 mg.    Confirmed patient has responsible adult to drive home post procedure and be with patient first 24 hours after arriving home.  [facility] does allow one visitor to accompany you and wait in the hospital waiting room while you are there for your procedure. You and your visitor will be asked to wear a mask once you enter the hospital.   Reviewed procedure/mask/visitor instructions with patient.

## 2021-04-06 NOTE — Telephone Encounter (Addendum)
Patient reports her husband has had cough/cold symptoms since Sunday, his symptoms are improving. Patient states starting yesterday she developed sore throat, nasal congestion, non-productive cough. Patient reports she is afebrile, doesn't feel terrible and feels symptoms are mild. Patient reports neither she or her husband have tested for COVID since their symptoms started.  I have reviewed with Dr Clifton James and he recommended rescheduling cath-will reschedule to 04/15/21 No pre-procedure COVID test needed if symptoms have resolved.  Patient advised that [facility] has been rescheduled with Dr Clifton James to 04/15/21 arrive 5:30 AM for 7:30 AM procedure time.

## 2021-04-13 ENCOUNTER — Other Ambulatory Visit: Payer: Self-pay

## 2021-04-13 ENCOUNTER — Ambulatory Visit (HOSPITAL_COMMUNITY): Payer: BC Managed Care – PPO | Attending: Cardiology

## 2021-04-13 ENCOUNTER — Telehealth: Payer: Self-pay | Admitting: *Deleted

## 2021-04-13 DIAGNOSIS — I25110 Atherosclerotic heart disease of native coronary artery with unstable angina pectoris: Secondary | ICD-10-CM | POA: Diagnosis not present

## 2021-04-13 DIAGNOSIS — I340 Nonrheumatic mitral (valve) insufficiency: Secondary | ICD-10-CM

## 2021-04-13 DIAGNOSIS — I5032 Chronic diastolic (congestive) heart failure: Secondary | ICD-10-CM

## 2021-04-13 LAB — ECHOCARDIOGRAM COMPLETE
Area-P 1/2: 4.39 cm2
S' Lateral: 3.2 cm

## 2021-04-13 NOTE — Telephone Encounter (Signed)
Cardiac catheterization scheduled at [facility] for: Friday April 15, 2021 7:30 AM Arrive [facility] Main Entrance A [facility]) at: 5:30 AM   Diet-no solid food after midnight prior to cath, clear liquids until 5 AM day of procedure.  Medication instructions for procedure: -Hold:  Lasix-AM of procedure -Except hold medications usual morning medications can be taken pre-cath with sips of water including aspirin 81 mg.    Must have responsible adult to drive home post procedure and be with patient first 24 hours after arriving home.  [facility] does allow one visitor to accompany you and wait in the hospital waiting room while you are there for your procedure. You and your visitor will be asked to wear a mask once you enter the hospital.   Patient reports does not currently have any new symptoms concerning for COVID-19 and no household members with COVID-19 like illness.   Patient reports cold /cough symptoms she was experiencing last week have resolved, afebrile, feels much better.

## 2021-04-14 ENCOUNTER — Encounter: Payer: Self-pay | Admitting: Cardiovascular Disease

## 2021-04-14 NOTE — Telephone Encounter (Signed)
Called patient and reviewed echo results from Dr. Angelena Form.  Pt voices that she was wanting to know if the findings change any of the plan for her, including cath tomorrow. She feels there is more information in the echo report from yesterday compared to last echo in 2017.   Adv that it appears there were no unexpected findings on echo and to proceed as planned for cath tomorrow.  Again, reviewed normal EF and mild mitral regurgitation which is unchanged.    Burnell Blanks, MD  04/13/2021  3:26 PM EST     Her heart is strong. Mild mitral regurgitation, unchanged from last echo. Can we let her know? Thank, chris

## 2021-04-15 ENCOUNTER — Encounter (HOSPITAL_COMMUNITY): Payer: Self-pay | Admitting: Cardiovascular Disease

## 2021-04-15 ENCOUNTER — Encounter (HOSPITAL_COMMUNITY)
Admission: RE | Disposition: A | Discharge status: Home / Self Care | Payer: Self-pay | Source: Home / Self Care | Attending: Cardiovascular Disease

## 2021-04-15 ENCOUNTER — Other Ambulatory Visit: Payer: Self-pay

## 2021-04-15 ENCOUNTER — Ambulatory Visit (HOSPITAL_COMMUNITY)
Admission: RE | Admit: 2021-04-15 | Discharge: 2021-04-15 | Disposition: A | Discharge status: Home / Self Care | Payer: BC Managed Care – PPO | Attending: Cardiovascular Disease | Admitting: Cardiovascular Disease

## 2021-04-15 DIAGNOSIS — I25118 Atherosclerotic heart disease of native coronary artery with other forms of angina pectoris: Secondary | ICD-10-CM

## 2021-04-15 DIAGNOSIS — Z79899 Other long term (current) drug therapy: Secondary | ICD-10-CM | POA: Insufficient documentation

## 2021-04-15 DIAGNOSIS — I5032 Chronic diastolic (congestive) heart failure: Secondary | ICD-10-CM | POA: Diagnosis not present

## 2021-04-15 DIAGNOSIS — I110 Hypertensive heart disease with heart failure: Secondary | ICD-10-CM | POA: Diagnosis not present

## 2021-04-15 DIAGNOSIS — K219 Gastro-esophageal reflux disease without esophagitis: Secondary | ICD-10-CM | POA: Diagnosis not present

## 2021-04-15 DIAGNOSIS — I340 Nonrheumatic mitral (valve) insufficiency: Secondary | ICD-10-CM | POA: Diagnosis not present

## 2021-04-15 DIAGNOSIS — Z7982 Long term (current) use of aspirin: Secondary | ICD-10-CM | POA: Insufficient documentation

## 2021-04-15 DIAGNOSIS — R0609 Other forms of dyspnea: Secondary | ICD-10-CM | POA: Diagnosis not present

## 2021-04-15 DIAGNOSIS — E785 Hyperlipidemia, unspecified: Secondary | ICD-10-CM | POA: Insufficient documentation

## 2021-04-15 DIAGNOSIS — M797 Fibromyalgia: Secondary | ICD-10-CM | POA: Insufficient documentation

## 2021-04-15 DIAGNOSIS — I25110 Atherosclerotic heart disease of native coronary artery with unstable angina pectoris: Secondary | ICD-10-CM

## 2021-04-15 HISTORY — PX: RIGHT/LEFT HEART CATH AND CORONARY ANGIOGRAPHY: CATH118266

## 2021-04-15 LAB — POCT I-STAT 7, (LYTES, BLD GAS, ICA,H+H)
Acid-base deficit: 5 mmol/L — ABNORMAL HIGH (ref 0.0–2.0)
Bicarbonate: 20.2 mmol/L (ref 20.0–28.0)
Calcium, Ion: 1.21 mmol/L (ref 1.15–1.40)
HCT: 36 % (ref 36.0–46.0)
Hemoglobin: 12.2 g/dL (ref 12.0–15.0)
O2 Saturation: 99 %
Potassium: 3.2 mmol/L — ABNORMAL LOW (ref 3.5–5.1)
Sodium: 144 mmol/L (ref 135–145)
TCO2: 21 mmol/L — ABNORMAL LOW (ref 22–32)
pCO2 arterial: 37.6 mmHg (ref 32.0–48.0)
pH, Arterial: 7.337 — ABNORMAL LOW (ref 7.350–7.450)
pO2, Arterial: 129 mmHg — ABNORMAL HIGH (ref 83.0–108.0)

## 2021-04-15 LAB — POCT I-STAT EG7
Acid-base deficit: 5 mmol/L — ABNORMAL HIGH (ref 0.0–2.0)
Bicarbonate: 20.8 mmol/L (ref 20.0–28.0)
Calcium, Ion: 1.19 mmol/L (ref 1.15–1.40)
HCT: 36 % (ref 36.0–46.0)
Hemoglobin: 12.2 g/dL (ref 12.0–15.0)
O2 Saturation: 81 %
Potassium: 3.2 mmol/L — ABNORMAL LOW (ref 3.5–5.1)
Sodium: 146 mmol/L — ABNORMAL HIGH (ref 135–145)
TCO2: 22 mmol/L (ref 22–32)
pCO2, Ven: 39.2 mmHg — ABNORMAL LOW (ref 44.0–60.0)
pH, Ven: 7.332 (ref 7.250–7.430)
pO2, Ven: 48 mmHg — ABNORMAL HIGH (ref 32.0–45.0)

## 2021-04-15 SURGERY — RIGHT/LEFT HEART CATH AND CORONARY ANGIOGRAPHY
Anesthesia: LOCAL

## 2021-04-15 MED ORDER — SODIUM CHLORIDE 0.9% FLUSH: 3.0000 mL | Freq: Two times a day (BID) | INTRAVENOUS | Status: DC

## 2021-04-15 MED ORDER — HEPARIN SODIUM (PORCINE) 1000 UNIT/ML IJ SOLN
INTRAMUSCULAR | Status: AC
  Filled 2021-04-15: qty 10

## 2021-04-15 MED ORDER — LABETALOL HCL 5 MG/ML IV SOLN: 10.0000 mg | INTRAVENOUS | Status: DC | PRN

## 2021-04-15 MED ORDER — LIDOCAINE HCL (PF) 1 % IJ SOLN
INTRAMUSCULAR | Status: DC | PRN
  Administered 2021-04-15: 5 mL

## 2021-04-15 MED ORDER — IOHEXOL 350 MG/ML SOLN
INTRAVENOUS | Status: DC | PRN
  Administered 2021-04-15: 40 mL via INTRA_ARTERIAL

## 2021-04-15 MED ORDER — SODIUM CHLORIDE 0.9 % WEIGHT BASED INFUSION: 1.0000 mL/kg/h | INTRAVENOUS | Status: DC

## 2021-04-15 MED ORDER — SODIUM CHLORIDE 0.9 % IV SOLN: INTRAVENOUS | Status: DC

## 2021-04-15 MED ORDER — HEPARIN (PORCINE) IN NACL 1000-0.9 UT/500ML-% IV SOLN
INTRAVENOUS | Status: AC
  Filled 2021-04-15: qty 1000

## 2021-04-15 MED ORDER — HYDRALAZINE HCL 20 MG/ML IJ SOLN: 10.0000 mg | INTRAMUSCULAR | Status: DC | PRN

## 2021-04-15 MED ORDER — MIDAZOLAM HCL 2 MG/2ML IJ SOLN
INTRAMUSCULAR | Status: AC
  Filled 2021-04-15: qty 2

## 2021-04-15 MED ORDER — FENTANYL CITRATE (PF) 100 MCG/2ML IJ SOLN
INTRAMUSCULAR | Status: DC | PRN
  Administered 2021-04-15: 50 ug via INTRAVENOUS

## 2021-04-15 MED ORDER — SODIUM CHLORIDE 0.9% FLUSH: 3.0000 mL | INTRAVENOUS | Status: DC | PRN

## 2021-04-15 MED ORDER — LIDOCAINE HCL (PF) 1 % IJ SOLN
INTRAMUSCULAR | Status: AC
  Filled 2021-04-15: qty 30

## 2021-04-15 MED ORDER — SODIUM CHLORIDE 0.9 % IV SOLN: 250.0000 mL | INTRAVENOUS | Status: DC | PRN

## 2021-04-15 MED ORDER — ASPIRIN 81 MG PO CHEW: 81.0000 mg | CHEWABLE_TABLET | ORAL | Status: DC

## 2021-04-15 MED ORDER — SODIUM CHLORIDE 0.9 % WEIGHT BASED INFUSION
3.0000 mL/kg/h | INTRAVENOUS | Status: AC
  Administered 2021-04-15: 3 mL/kg/h via INTRAVENOUS

## 2021-04-15 MED ORDER — MIDAZOLAM HCL 2 MG/2ML IJ SOLN
INTRAMUSCULAR | Status: DC | PRN
  Administered 2021-04-15: 2 mg via INTRAVENOUS

## 2021-04-15 MED ORDER — VERAPAMIL HCL 2.5 MG/ML IV SOLN
INTRAVENOUS | Status: AC
  Filled 2021-04-15: qty 2

## 2021-04-15 MED ORDER — FENTANYL CITRATE (PF) 100 MCG/2ML IJ SOLN
INTRAMUSCULAR | Status: AC
  Filled 2021-04-15: qty 2

## 2021-04-15 MED ORDER — VERAPAMIL HCL 2.5 MG/ML IV SOLN
INTRAVENOUS | Status: DC | PRN
  Administered 2021-04-15: 10 mL via INTRA_ARTERIAL

## 2021-04-15 MED ORDER — ONDANSETRON HCL 4 MG/2ML IJ SOLN: 4.0000 mg | Freq: Four times a day (QID) | INTRAMUSCULAR | Status: DC | PRN

## 2021-04-15 MED ORDER — HEPARIN (PORCINE) IN NACL 1000-0.9 UT/500ML-% IV SOLN
INTRAVENOUS | Status: DC | PRN
  Administered 2021-04-15 (×2): 500 mL

## 2021-04-15 MED ORDER — ACETAMINOPHEN 325 MG PO TABS: 650.0000 mg | ORAL_TABLET | ORAL | Status: DC | PRN

## 2021-04-15 MED ORDER — HEPARIN SODIUM (PORCINE) 1000 UNIT/ML IJ SOLN
INTRAMUSCULAR | Status: DC | PRN
  Administered 2021-04-15: 5000 [IU] via INTRAVENOUS

## 2021-04-15 SURGICAL SUPPLY — 15 items
CATH BALLN WEDGE 5F 110CM (CATHETERS) ×1 IMPLANT
CATH INFINITI JR4 5F (CATHETERS) ×1 IMPLANT
CATH LAUNCHER 5F EBU3.0 (CATHETERS) IMPLANT
CATHETER LAUNCHER 5F EBU3.0 (CATHETERS) ×2
DEVICE RAD COMP TR BAND LRG (VASCULAR PRODUCTS) ×1 IMPLANT
GLIDESHEATH SLEND SS 6F .021 (SHEATH) ×1 IMPLANT
GUIDEWIRE .025 260CM (WIRE) ×1 IMPLANT
GUIDEWIRE INQWIRE 1.5J.035X260 (WIRE) IMPLANT
INQWIRE 1.5J .035X260CM (WIRE) ×2
KIT HEART LEFT (KITS) ×2 IMPLANT
PACK CARDIAC CATHETERIZATION (CUSTOM PROCEDURE TRAY) ×2 IMPLANT
SHEATH GLIDE SLENDER 4/5FR (SHEATH) ×1 IMPLANT
SYR MEDRAD MARK 7 150ML (SYRINGE) ×2 IMPLANT
TRANSDUCER W/STOPCOCK (MISCELLANEOUS) ×2 IMPLANT
TUBING CIL FLEX 10 FLL-RA (TUBING) ×2 IMPLANT

## 2021-04-15 NOTE — Interval H&P Note (Signed)
History and Physical Interval Note:  04/15/2021 7:16 AM  Maria Becker  has presented today for surgery, with the diagnosis of dyspnea.  The various methods of treatment have been discussed with the patient and family. After consideration of risks, benefits and other options for treatment, the patient has consented to  Procedure(s): RIGHT/LEFT HEART CATH AND CORONARY ANGIOGRAPHY (N/A) as a surgical intervention.  The patient's history has been reviewed, patient examined, no change in status, stable for surgery.  I have reviewed the patient's chart and labs.  Questions were answered to the patient's satisfaction.    Cath Lab Visit (complete for each Cath Lab visit)  Clinical Evaluation Leading to the Procedure:   ACS: No.  Non-ACS:    Anginal Classification: CCS III  Anti-ischemic medical therapy: No Therapy  Non-Invasive Test Results: No non-invasive testing performed  Prior CABG: No previous CABG        Verne Carrow

## 2021-04-18 ENCOUNTER — Encounter: Payer: Self-pay | Admitting: Cardiovascular Disease

## 2021-04-18 MED ORDER — PANTOPRAZOLE SODIUM 40 MG PO TBEC: 40.0000 mg | DELAYED_RELEASE_TABLET | Freq: Every day | ORAL | 11 refills | Status: DC

## 2021-04-18 MED ORDER — POTASSIUM CHLORIDE CRYS ER 20 MEQ PO TBCR: 20.0000 meq | EXTENDED_RELEASE_TABLET | Freq: Every day | ORAL | 3 refills | Status: DC

## 2021-04-18 NOTE — Telephone Encounter (Signed)
Called patient and reviewed that her cath lab labs were reviewed at the time by Dr. Clifton James.  Her potassium of 3.2 was seen.  She is starting lasix 40 mg daily.  She reports indigestion on a regular basis and states that pantoprazole was discussed with Dr. Clifton James.  Reviewed with Dr. Clifton James.  Pt will start Protonix 40 mg daily as well as potassium chloride 20 meq daily.    Pt appreciative for assistance.

## 2021-04-22 ENCOUNTER — Other Ambulatory Visit: Payer: Self-pay | Admitting: Cardiovascular Disease

## 2021-04-22 ENCOUNTER — Ambulatory Visit: Payer: Self-pay | Admitting: Cardiovascular Disease

## 2021-04-22 ENCOUNTER — Encounter: Payer: Self-pay | Admitting: Pharmacist

## 2021-04-22 MED ORDER — ESOMEPRAZOLE MAGNESIUM 40 MG PO CPDR
40.0000 mg | DELAYED_RELEASE_CAPSULE | Freq: Every day | ORAL | 11 refills | Status: DC
Start: 2021-04-22 — End: 2021-05-04

## 2021-05-02 ENCOUNTER — Ambulatory Visit: Payer: 59 | Admitting: Interventional Cardiology

## 2021-05-04 ENCOUNTER — Ambulatory Visit: Payer: BC Managed Care – PPO | Admitting: Adult Health

## 2021-05-04 ENCOUNTER — Encounter: Payer: Self-pay | Admitting: Adult Health

## 2021-05-04 VITALS — BP 130/90 | HR 79 | Ht 66.0 in | Wt 205.0 lb

## 2021-05-04 DIAGNOSIS — G43009 Migraine without aura, not intractable, without status migrainosus: Secondary | ICD-10-CM

## 2021-05-04 DIAGNOSIS — G43109 Migraine with aura, not intractable, without status migrainosus: Secondary | ICD-10-CM | POA: Diagnosis not present

## 2021-05-04 MED ORDER — TOPIRAMATE 100 MG PO TABS: 100.0000 mg | ORAL_TABLET | Freq: Every day | ORAL | 3 refills | Status: DC

## 2021-05-04 MED ORDER — NURTEC 75 MG PO TBDP: ORAL_TABLET | ORAL | 0 refills | Status: DC

## 2021-05-04 NOTE — Patient Instructions (Addendum)
Your Plan:  Continue Topamax 100 mg daily  Try nurtrec at the onset of migraine. Only 1 tab in 24 hours If your symptoms worsen or you develop new symptoms please let us know.    Thank you for coming to see [place] at [facility]. I hope we have been able to provide you high quality care today.  You may receive a patient satisfaction survey over the next few weeks. We would appreciate your feedback and comments so that we may continue to improve ourselves and the health of our patients.

## 2021-05-04 NOTE — Progress Notes (Signed)
PATIENT: Maria Becker DOB: July 02, 1960  REASON FOR VISIT: follow up HISTORY FROM: patient  HISTORY OF PRESENT ILLNESS: Today 05/04/21:  [name] is a 61 year old female with a history of basilar migraines.  She returns today for follow-up.  She reports that over the last year her headaches have increased.  She states about 4 months ago she increased her Topamax to 100 mg.  This improved her headaches.  She has typically 1 episode a week.  She always gets blurred vision not always followed by painful headache.  Typically can take Advil or Excedrin and her vision will improve.  The patient also reports that she has had ongoing left knee pain due to her torn meniscus.  Her insurance denied her surgery.  She states that due to the increased pain she feels like this is affecting her headaches as well.  She is currently happy with Topamax.  She was on Imitrex but due to her recent heart history we will discontinue this medication  05/31/20: Ms. Maria Becker is a 61 year old female with a history of Basilar Migraine. She returns today.  She reports that she stopped the Topamax a while back.  States that she was having blurry vision however she reports that the blurry vision has been chronic prior to starting Topamax.  She states that since she is been off the Topamax the blurry vision has not improved at all.  Her headaches have become daily.  She reports that she has a dull throbbing headache most days.  She is only had 2 severe migraines since January which required Imitrex.  She is interested in restarting Topamax.  She states that the blurry vision typically comes around her menstrual cycle.  She has not discussed this with her ophthalmologist nor her PCP.  05/28/19: Ms. Maria Becker is a 61 year old female with a history of basilar migraine headaches.  She returns today for follow-up.  She continues on Topamax 200 mg daily.  She reports that her headaches have increased but she is under a lot of stress.   Reports that her mother-in-law recently moved in with her.  She has dementia and they are taking care of her for now.  She feels that most of her headaches are tension headaches.  She does use Tylenol to treat her headaches.  She does not wish to start any new medication.  In the past she has tried Imitrex and Zomig as an abortive therapy with good benefit.  HISTORY 09/02/18:   Ms. Maria Becker is a 61 year old female with a history of basilar migraine headaches.  She states that her headaches have increased recently but she contributes this to stress.  She states that she was out of work for 8 weeks due to COVID-19.  She states that she has not started back at work but is been very stressful.  She states that she is having to take Tylenol approximately 4 times a week for the headache.  She reports that she responds well to Tylenol.  She feels that her headaches will get better as her stress level starts to decrease.  She joins me today for virtual visit.  REVIEW OF SYSTEMS: Out of a complete 14 system review of symptoms, the patient complains only of the following symptoms, and all other reviewed systems are negative.  See HPI  ALLERGIES: Allergies  Allergen Reactions   Gluten Meal    Other     Dairy cow milk    HOME MEDICATIONS: Outpatient Medications Prior to Visit  Medication Sig Dispense Refill   aspirin EC 81 MG tablet Take 1 tablet (81 mg total) by mouth daily. Swallow whole. 90 tablet 3   b complex vitamins tablet Take 1 tablet by mouth daily.     esomeprazole (NEXIUM) 40 MG capsule Take 1 capsule (40 mg total) by mouth daily at 12 noon. 30 capsule 11   furosemide (LASIX) 40 MG tablet Take 1 tablet (40 mg total) by mouth daily. 90 tablet 3   liothyronine (CYTOMEL) 5 MCG tablet Take 10 mcg by mouth daily.     MAGNESIUM PO Take 250 mg by mouth every other day.     Multiple Vitamins-Minerals (MULTIVITAMIN WITH MINERALS) tablet Take 1 tablet by mouth daily. With calcium     potassium  chloride SA (KLOR-CON M) 20 MEQ tablet Take 1 tablet (20 mEq total) by mouth daily. 90 tablet 3   rosuvastatin (CRESTOR) 10 MG tablet Take 1 tablet (10 mg total) by mouth daily. 90 tablet 3   SUMAtriptan (IMITREX) 50 MG tablet Take for acute migraine. May repeat in 2 hours if headache persists. No more than 2 pills/24 h, no more than 3 pills/week. 10 tablet 0   tamsulosin (FLOMAX) 0.4 MG CAPS capsule Take 0.4 mg by mouth daily as needed (Kidnsy stone).     topiramate (TOPAMAX) 50 MG tablet Take 1 tablet (50 mg total) by mouth at bedtime. 90 tablet 3   Vitamin D, Ergocalciferol, (DRISDOL) 1.25 MG (50000 UNIT) CAPS capsule Take 50,000 Units by mouth every 7 (seven) days.     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 cath 07/2015: 30-50% mid-LAD stenosis but otherwise normal cors   Fibromyalgia    GERD (gastroesophageal reflux disease)    Headache    Hyperlipidemia    a. not currently on statin therapy   Hypertension    Insomnia     PAST SURGICAL HISTORY: Past Surgical History:  Procedure Laterality Date   ABDOMINAL HYSTERECTOMY     BACK SURGERY     CARDIAC CATHETERIZATION N/A 07/20/2015   Procedure: Left Heart Cath and Coronary Angiography;  Surgeon: Belva Crome, MD;  Location: [facility];  Service: Cardiovascular;  Laterality: N/A;   L4-L5 fusion     RIGHT/LEFT HEART CATH AND CORONARY ANGIOGRAPHY N/A 04/15/2021   Procedure: RIGHT/LEFT HEART CATH AND CORONARY ANGIOGRAPHY;  Surgeon: Burnell Blanks, MD;  Location: [facility];  Service:  Cardiovascular;  Laterality: N/A;    FAMILY HISTORY: Family History  Problem Relation Age of Onset   COPD Mother    Heart attack Mother 24   Diabetes Mother    Aneurysm Mother    Melanoma Father    Diabetes Father    Diabetes Sister    Diabetes Brother    Heart attack Brother 75   Kidney disease Brother    Diabetes Maternal Grandmother    Heart disease Maternal Grandmother    Diabetes Maternal Grandfather    Heart disease Maternal Grandfather    Diabetes Paternal Grandmother  Heart disease Paternal Grandmother    Diabetes Paternal Grandfather    Heart disease Paternal Grandfather    Cancer - Colon Maternal Aunt    Kidney disease Maternal Aunt    Diabetes Maternal Aunt    Aneurysm Maternal Uncle    Hypothyroidism Paternal Aunt    Aneurysm Cousin     SOCIAL HISTORY: Social History   Socioeconomic History   Marital status: Married    Spouse name: Not on file   Number of children: 2   Years of education: 13   Highest education level: Not on file  Occupational History   Occupation: self-employed  Tobacco Use   Smoking status: Never   Smokeless tobacco: Never  Substance and Sexual Activity   Alcohol use: Yes    Comment: occasional   Drug use: No   Sexual activity: Yes    Birth control/protection: Other-see comments  Other Topics Concern   Not on file  Social History Narrative   Patient drinks 1 soda daily.   Patient is right handed.    Social Determinants of Health   Financial Resource Strain: Not on file  Food Insecurity: Not on file  Transportation Needs: Not on file  Physical Activity: Not on file  Stress: Not on file  Social Connections: Not on file  Intimate Partner Violence: Not on file      PHYSICAL EXAM  Vitals:   05/04/21 0945  BP: 130/90  Pulse: 79  Weight: 205 lb (93 kg)  Height: 5\' 6"  (1.676 m)    Body mass index is 33.09 kg/m.  Generalized: Well developed, in no acute distress   Neurological examination  Mentation: Alert  oriented to time, place, history taking. Follows all commands speech and language fluent Cranial nerve II-XII: Pupils were equal round reactive to light. Extraocular movements were full, visual field were full on confrontational test.  Head turning and shoulder shrug  were normal and symmetric. Motor: The motor testing reveals 5 over 5 strength of all 4 extremities. Good symmetric motor tone is noted throughout.  Sensory: Sensory testing is intact to soft touch on all 4 extremities. No evidence of extinction is noted.  Coordination: Cerebellar testing reveals good finger-nose-finger and heel-to-shin bilaterally.  Gait and station: Gait is normal.  Slight limp on the left due to knee Reflexes: Deep tendon reflexes are symmetric and normal bilaterally.   DIAGNOSTIC DATA (LABS, IMAGING, TESTING) - I reviewed patient records, labs, notes, testing and imaging myself where available.  Lab Results  Component Value Date   WBC 5.9 04/01/2021   HGB 12.2 04/15/2021   HCT 36.0 04/15/2021   MCV 89 04/01/2021   PLT 252 04/01/2021      Component Value Date/Time   NA 144 04/15/2021 0751   NA 143 04/01/2021 1305   K 3.2 (L) 04/15/2021 0751   CL 106 04/01/2021 1305   CO2 22 04/01/2021 1305   GLUCOSE 149 (H) 04/01/2021 1305   GLUCOSE 90 07/20/2015 1046   BUN 14 04/01/2021 1305   CREATININE 0.96 04/01/2021 1305   CALCIUM 9.6 04/01/2021 1305   PROT 7.0 07/20/2015 1046   ALBUMIN 3.8 07/20/2015 1046   AST 21 07/20/2015 1046   ALT 16 07/20/2015 1046   ALKPHOS 61 07/20/2015 1046   BILITOT 0.5 07/20/2015 1046   GFRNONAA >60 07/20/2015 1046   GFRAA >60 07/20/2015 1046   Lab Results  Component Value Date   CHOL (H) 02/28/2007    237        ATP III  CLASSIFICATION:  <200     mg/dL   Desirable  200-239  mg/dL   Borderline High  >=240    mg/dL   High   HDL 71 02/28/2007   LDLCALC (H) 02/28/2007    159        Total Cholesterol/HDL:CHD Risk Coronary Heart Disease Risk Table                     Men    Women  1/2 Average Risk   3.4   3.3   TRIG 37 02/28/2007   CHOLHDL 3.3 02/28/2007   Lab Results  Component Value Date   HGBA1C 5.6 07/20/2015   No results found for: VITAMINB12 Lab Results  Component Value Date   TSH 3.071 07/20/2015      ASSESSMENT AND PLAN 61 y.o. 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 here with:  1.  Basilar Migraine headaches  -Continue Topamax 100 mg at bedtime. -Discontinue Imitrex d/t CAD Try Nurtrec 75 mg daily at the onset of migraine -Advised if headache frequency increases she should let us know -Follow-up in 1 year or sooner if needed   Ward Givens, MSN, NP-C 05/04/2021, 9:39 AM [facility] [address], [place],  [phone]

## 2021-05-10 ENCOUNTER — Encounter: Payer: Self-pay | Admitting: Cardiovascular Disease

## 2021-05-10 ENCOUNTER — Ambulatory Visit: Payer: BC Managed Care – PPO | Admitting: Cardiovascular Disease

## 2021-05-10 ENCOUNTER — Other Ambulatory Visit: Payer: Self-pay

## 2021-05-10 VITALS — BP 104/66 | HR 84 | Ht 66.0 in | Wt 206.2 lb

## 2021-05-10 DIAGNOSIS — I25110 Atherosclerotic heart disease of native coronary artery with unstable angina pectoris: Secondary | ICD-10-CM | POA: Diagnosis not present

## 2021-05-10 DIAGNOSIS — I340 Nonrheumatic mitral (valve) insufficiency: Secondary | ICD-10-CM

## 2021-05-10 DIAGNOSIS — I5032 Chronic diastolic (congestive) heart failure: Secondary | ICD-10-CM

## 2021-05-10 NOTE — Progress Notes (Signed)
Chief Complaint  Patient presents with   Follow-up    CAD   History of Present Illness: 61 yo female with history of CAD, GERD, fibromyalgia, hyperlipidemia and HTN who is here today for follow up. I saw her as a new consult for the evaluation of dyspnea and LE edema in January 2023. She has been seen in the past in our office by Dr. Tamala Julian. Echo February 2017 with LVEF=55-60%, mild mitral regurgitation. Nuclear stress test May 2017 with possible anterior wall ischemia. Cardiac cath May 2017 with 30-50% mid LAD stenosis not felt to be flow limiting and otherwise normal coronary arteries. She told me at her first visit in January 2023 that she had ongoing dyspnea and lower extremity edema. She had gained 40 lbs over the past year. She also described chest pressure. She has not felt well since taking her last Covid booster. Echo 04/13/21 with LVEF=60-65%. Mild MR. Cardiac cath 04/15/21 with 40% mid LAD stenosis and 40% mid Circumflex stenosis. Medical therapy was recommended. Crestor and ASA started.   She is here today for follow up. The patient denies any chest pain, dyspnea, palpitations, lower extremity edema, orthopnea, PND, dizziness, near syncope or syncope.   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 cath 07/2015: 30-50% mid-LAD stenosis but otherwise normal cors   Fibromyalgia    GERD (gastroesophageal reflux disease)    Headache    Hyperlipidemia    a. not currently on statin therapy   Hypertension    Insomnia     Past Surgical History:  Procedure Laterality Date   ABDOMINAL HYSTERECTOMY     BACK SURGERY     CARDIAC CATHETERIZATION N/A 07/20/2015   Procedure: Left Heart Cath and Coronary Angiography;  Surgeon: Belva Crome, MD;  Location: [facility];  Service: Cardiovascular;  Laterality: N/A;   L4-L5 fusion     RIGHT/LEFT HEART CATH AND  CORONARY ANGIOGRAPHY N/A 04/15/2021   Procedure: RIGHT/LEFT HEART CATH AND CORONARY ANGIOGRAPHY;  Surgeon: Burnell Blanks, MD;  Location: [facility];  Service: Cardiovascular;  Laterality: N/A;    Current Outpatient Medications  Medication Sig Dispense Refill   aspirin EC 81 MG tablet Take 1 tablet (81 mg total) by mouth daily. Swallow whole. 90 tablet 3   b complex vitamins tablet Take 1 tablet by mouth daily.     furosemide (LASIX) 40 MG tablet Take 1 tablet (40 mg total) by mouth daily. 90 tablet 3   liothyronine (CYTOMEL) 5 MCG tablet Take 10 mcg by mouth daily.     MAGNESIUM PO Take 250 mg by mouth every other day.     Multiple Vitamins-Minerals (MULTIVITAMIN WITH MINERALS) tablet Take 1 tablet by mouth daily. With calcium     potassium chloride SA (KLOR-CON M) 20 MEQ tablet Take 1 tablet (20 mEq total) by mouth daily. 90 tablet 3   Rimegepant Sulfate (NURTEC) 75 MG TBDP PRN for headaches 4 tablet 0   rosuvastatin (CRESTOR) 10 MG tablet Take 1 tablet (10 mg total) by mouth daily. 90 tablet 3   tamsulosin (FLOMAX) 0.4 MG CAPS capsule Take 0.4 mg by mouth daily as needed (Kidnsy stone).     topiramate (TOPAMAX) 100 MG tablet Take 1 tablet (100 mg total) by mouth at bedtime. 90 tablet 3   Vitamin D, Ergocalciferol, (DRISDOL) 1.25 MG (50000  UNIT) CAPS capsule Take 50,000 Units by mouth every 7 (seven) days.     VITAMIN E PO Take by mouth.     Current Facility-Administered Medications  Medication Dose Route Frequency Provider Last Rate Last Admin   sodium chloride flush (NS) 0.9 % injection 3 mL  3 mL Intravenous Q12H Burnell Blanks, MD       Facility-Administered Medications Ordered in Other Visits  Medication Dose Route Frequency Provider Last Rate Last Admin   gadopentetate dimeglumine (MAGNEVIST) injection 20 mL  20 mL Intravenous Once PRN Kathrynn Ducking, MD        Allergies  Allergen Reactions   Gluten Meal    Other     Dairy cow milk    Social  History   Socioeconomic History   Marital status: Married    Spouse name: Not on file   Number of children: 2   Years of education: 13   Highest education level: Not on file  Occupational History   Occupation: self-employed  Tobacco Use   Smoking status: Never   Smokeless tobacco: Never  Substance and Sexual Activity   Alcohol use: Yes    Comment: occasional   Drug use: No   Sexual activity: Yes    Birth control/protection: Other-see comments  Other Topics Concern   Not on file  Social History Narrative   Patient drinks 1 soda daily.   Patient is right handed.    Social Determinants of Health   Financial Resource Strain: Not on file  Food Insecurity: Not on file  Transportation Needs: Not on file  Physical Activity: Not on file  Stress: Not on file  Social Connections: Not on file  Intimate Partner Violence: Not on file    Family History  Problem Relation Age of Onset   COPD Mother    Heart attack Mother 44   Diabetes Mother    Aneurysm Mother    Melanoma Father    Diabetes Father    Diabetes Sister    Diabetes Brother    Heart attack Brother 61   Kidney disease Brother    Diabetes Maternal Grandmother    Heart disease Maternal Grandmother    Diabetes Maternal Grandfather    Heart disease Maternal Grandfather    Diabetes Paternal Grandmother    Heart disease Paternal Grandmother    Diabetes Paternal Grandfather    Heart disease Paternal Grandfather    Cancer - Colon Maternal Aunt    Kidney disease Maternal Aunt    Diabetes Maternal Aunt    Aneurysm Maternal Uncle    Hypothyroidism Paternal Aunt    Aneurysm Cousin     Review of Systems:  As stated in the HPI and otherwise negative.   BP 104/66    Pulse 84    Ht 5\' 6"  (1.676 m)    Wt 206 lb 3.2 oz (93.5 kg)    SpO2 98%    BMI 33.28 kg/m   Physical Examination: General: Well developed, well nourished, NAD  HEENT: OP clear, mucus membranes moist  SKIN: warm, dry. No rashes. Neuro: No focal deficits   Musculoskeletal: Muscle strength 5/5 all ext  Psychiatric: Mood and affect normal  Neck: No JVD, no carotid bruits, no thyromegaly, no lymphadenopathy.  Lungs:Clear bilaterally, no wheezes, rhonci, crackles Cardiovascular: Regular rate and rhythm. No murmurs, gallops or rubs. Abdomen:Soft. Bowel sounds present. Non-tender.  Extremities: No lower extremity edema. Pulses are 2 + in the bilateral DP/PT.  EKG:  EKG is not ordered today.  The ekg ordered today demonstrates   Echo 05/03/21:  1. Left ventricular ejection fraction, by estimation, is 60 to 65%. Left  ventricular ejection fraction by 3D volume is 62 %. The left ventricle has  normal function. The left ventricle has no regional wall motion  abnormalities. Left ventricular diastolic   parameters were normal. The average left ventricular global longitudinal  strain is -25.2 %. The global longitudinal strain is normal.   2. Right ventricular systolic function is normal. The right ventricular  size is normal. Tricuspid regurgitation signal is inadequate for assessing  PA pressure.   3. The mitral valve is normal in structure. Mild mitral valve  regurgitation. No evidence of mitral stenosis.   4. The aortic valve is tricuspid. Aortic valve regurgitation is not  visualized. Aortic valve sclerosis is present, with no evidence of aortic  valve stenosis.   5. The inferior vena cava is normal in size with greater than 50%  respiratory variability, suggesting right atrial pressure of 3 mmHg.   Recent Labs: 04/01/2021: BUN 14; Creatinine, Ser 0.96; Platelets 252 04/15/2021: Hemoglobin 12.2; Potassium 3.2; Sodium 144   Lipid Panel  LDL 137 in September 2022    Wt Readings from Last 3 Encounters:  05/10/21 206 lb 3.2 oz (93.5 kg)  05/04/21 205 lb (93 kg)  04/15/21 205 lb (93 kg)    Assessment and Plan:   1. CAD without angina: Continue ASA and statin. Goal LDL under 70. She started Crestor 4 weeks ago. She will have labs in may 2023  in primary care.   2. Chronic diastolic CHF: No volume overload on exam. Continue Lasix.   3. Mitral regurgitation: Mild by echo in 2023.   Labs/ tests ordered today include:   No orders of the defined types were placed in this encounter.  Disposition:   F/U with me one year   Signed, Lauree Chandler, MD 05/10/2021 4:08 PM    [facility] [address], [place], [place]  [address] Phone: [phone]; Fax: [phone]

## 2021-05-10 NOTE — Patient Instructions (Addendum)

## 2021-06-06 ENCOUNTER — Ambulatory Visit: Payer: 59 | Admitting: Gastroenterology

## 2021-06-06 ENCOUNTER — Ambulatory Visit: Payer: 59 | Admitting: Adult Health

## 2021-06-11 ENCOUNTER — Other Ambulatory Visit: Payer: Self-pay | Admitting: Adult Health

## 2021-06-13 ENCOUNTER — Ambulatory Visit (INDEPENDENT_AMBULATORY_CARE_PROVIDER_SITE_OTHER): Payer: BC Managed Care – PPO

## 2021-06-13 ENCOUNTER — Ambulatory Visit: Payer: BC Managed Care – PPO | Admitting: Orthopaedic Surgery

## 2021-06-13 ENCOUNTER — Encounter: Payer: Self-pay | Admitting: Orthopaedic Surgery

## 2021-06-13 DIAGNOSIS — M1712 Unilateral primary osteoarthritis, left knee: Secondary | ICD-10-CM | POA: Diagnosis not present

## 2021-06-13 DIAGNOSIS — M25562 Pain in left knee: Secondary | ICD-10-CM

## 2021-06-13 DIAGNOSIS — G8929 Other chronic pain: Secondary | ICD-10-CM | POA: Diagnosis not present

## 2021-06-13 NOTE — Progress Notes (Signed)
? ?Office Visit Note ?  ?Patient: Maria Becker           ?Date of Birth: [birth year]           ?MRN: [id number] ?Visit Date: 06/13/2021 ?             ?Requested by: Celene Squibb, MD ?[address] ?[address] ?[place],  [place] [address] ?PCP: Celene Squibb, MD ? ? ?Assessment & Plan: ?Visit Diagnoses:  ?1. Chronic pain of left knee   ?2. Unilateral primary osteoarthritis, left knee   ? ? ?Plan: We spoke in length in detail about knee replacement surgery and I have recommended that for her left knee given the failure of conservative treatment combined with her clinical exam findings, her signs and symptoms and MRI findings as well as plain film findings of her left knee.  We talked about the risks and benefits of knee replacement surgery.  I went over her x-rays as well as a knee replacement model.  We discussed what to expect from an intraoperative and postoperative course.  At this point she is ready to proceed given the severity of her pain and the detrimental effect it is having on her mobility, her quality of life and actives daily living.  We will be in touch about scheduling her for a left knee replacement. ?Follow-Up Instructions: Return for 2 weeks post-op.  ? ?Orders:  ?Orders Placed This Encounter  ?Procedures  ? XR KNEE 3 VIEW LEFT  ? ?No orders of the defined types were placed in this encounter. ? ? ? ? Procedures: ?No procedures performed ? ? ?Clinical Data: ?No additional findings. ? ? ?Subjective: ?Chief Complaint  ?Patient presents with  ? Left Knee - Pain  ?The patient is a very pleasant 61 year old female who comes in today to talk about knee replacement surgery.  She was actually scheduled back in August to have a knee replacement by one of my colleagues in town but something was different about her health insurance that changed and she cannot have that surgery.  She did have a cardiac catheterization in January of this year.  She has since been cleared for any type of surgical intervention.  She is not on  blood thinning medications.  She does take Lasix.  Her left knee pain is daily and it is 10 out of 10 pain.  It is debilitating for her.  At this point her left knee pain is detrimentally affecting her mobility, her quality of life and actives daily living.  A MRI on the canopy system showed areas of full-thickness cartilage loss and subchondral edema in the lateral compartment of her knee.  There is also degenerative meniscal tearing.  She walks with a limp.  Her pain is constant and severe.  She has tried and failed all forms conservative treatment for over 12 months including activity modification, multiple injections and physical therapy on her left knee. ? ?HPI ? ?Review of Systems ?She currently denies any headache, chest pain, shortness of breath, fever, chills, nausea, vomiting ? ?Objective: ?Vital Signs: There were no vitals taken for this visit. ? ?Physical Exam ?She is alert and orient x3 and in no acute distress ?Ortho Exam ?Examination of her left knee shows moderate swelling.  There is definitely valgus malalignment.  There is severe pain laterally and at the patellofemoral joint.  The knee feels like this is stable but is painful throughout his arc of motion. ?----+-++- ?Specialty Comments:  ?No specialty comments available. ? ?  Imaging: ?XR KNEE 3 VIEW LEFT ? ?Result Date: 06/13/2021 ?2 views of the left knee show valgus malalignment with significant loss of space in the lateral compartment and the patellofemoral joint.  ? ? ?PMFS History: ?Patient Active Problem List  ? Diagnosis Date Noted  ? Unilateral primary osteoarthritis, left knee 06/13/2021  ? Dyspnea on exertion   ? Angina decubitus ([place]) 07/20/2015  ? CAD (coronary artery disease), native coronary artery, LAD on cardiac CT 07/20/2015  ? HTN (hypertension) 07/20/2015  ? Family history of premature CAD 07/20/2015  ? Hyperlipidemia LDL goal <100 07/20/2015  ? Angina at rest [facility]) 07/20/2015  ? Abnormal nuclear stress test   ? Basilar artery  migraine 02/01/2015  ? ?Past Medical History:  ?Diagnosis Date  ? 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 CAD (coronary artery disease)   ? a. cath 07/2015: 30-50% mid-LAD stenosis but otherwise normal cors  ? Fibromyalgia   ? GERD (gastroesophageal reflux disease)   ? Headache   ? Hyperlipidemia   ? a. not currently on statin therapy  ? Hypertension   ? Insomnia   ?  ?Family History  ?Problem Relation Age of Onset  ? COPD Mother   ? Heart attack Mother 50  ? Diabetes Mother   ? Aneurysm Mother   ? Melanoma Father   ? Diabetes Father   ? Diabetes Sister   ? Diabetes Brother   ? Heart attack Brother 64  ? Kidney disease Brother   ? Diabetes Maternal Grandmother   ? Heart disease Maternal Grandmother   ? Diabetes Maternal Grandfather   ? Heart disease Maternal Grandfather   ? Diabetes Paternal Grandmother   ? Heart disease Paternal Grandmother   ? Diabetes Paternal Grandfather   ? Heart disease Paternal Grandfather   ? Cancer - Colon Maternal Aunt   ? Kidney disease Maternal Aunt   ? Diabetes Maternal Aunt   ? Aneurysm Maternal Uncle   ? Hypothyroidism Paternal Aunt   ? Aneurysm Cousin   ?  ?Past Surgical History:  ?Procedure Laterality Date  ? ABDOMINAL HYSTERECTOMY    ? BACK SURGERY    ? CARDIAC CATHETERIZATION N/A 07/20/2015  ? Procedure: Left Heart Cath and Coronary Angiography;  Surgeon: Belva Crome, MD;  Location: [facility];  Service: Cardiovascular;  Laterality: N/A;  ? L4-L5 fusion    ? RIGHT/LEFT HEART CATH AND CORONARY ANGIOGRAPHY N/A 04/15/2021  ? Procedure: RIGHT/LEFT HEART CATH AND CORONARY ANGIOGRAPHY;  Surgeon: Burnell Blanks, MD;  Location: [facility];  Service: Cardiovascular;  Laterality: N/A;  ? ?Social History  ? ?Occupational History  ? Occupation: self-employed  ?Tobacco Use  ? Smoking status: Never  ? Smokeless tobacco: Never  ?Substance and Sexual Activity  ? Alcohol use: Yes  ?  Comment: occasional  ? Drug use: No  ? Sexual activity: Yes  ?  Birth control/protection:  Other-see comments  ? ? ? ? ? ? ?

## 2021-06-20 ENCOUNTER — Encounter: Payer: Self-pay | Admitting: Orthopaedic Surgery

## 2021-06-20 DIAGNOSIS — E785 Hyperlipidemia, unspecified: Secondary | ICD-10-CM | POA: Diagnosis not present

## 2021-06-20 DIAGNOSIS — R7303 Prediabetes: Secondary | ICD-10-CM | POA: Diagnosis not present

## 2021-06-20 DIAGNOSIS — E039 Hypothyroidism, unspecified: Secondary | ICD-10-CM | POA: Diagnosis not present

## 2021-06-20 DIAGNOSIS — E559 Vitamin D deficiency, unspecified: Secondary | ICD-10-CM | POA: Diagnosis not present

## 2021-06-27 NOTE — Telephone Encounter (Signed)
Called patient and scheduled surgery

## 2021-06-30 ENCOUNTER — Other Ambulatory Visit: Payer: Self-pay

## 2021-07-13 NOTE — Progress Notes (Addendum)
Anesthesia Review: ? ?PCP: Timothy Lasso hall  ?Cardiologist : DR Clifton James- LOV 2/28/'23  ?Awaiting clearance. See notes from 07/15/21.  ?Chest x-ray : ?EKG :04/01/21  ?Echo : 04/13/21  ?Stress test: ?Cardiac Cath :  04/15/21  ?Activity level: can do a flight of steps wtihout difficulty  ?Sleep Study/ CPAP : none  ?Fasting Blood Sugar :      / Checks Blood Sugar -- times a day:   ?Blood Thinner/ Instructions /Last Dose: ?ASA / Instructions/ Last Dose :  ?81 mg aspirin   ?Pt states at preop she has noted her fingers are swollen and left leg is swollen.  LOV with DR Clifton James.  On 05/10/21.  Pt states she has put on 6 lbs since her visit with him.  Asked pt does she weigh herself every day.  PT states she does not.  Instructed to weight herself every day  to watch for fluid buildup.  PT voiced understanidng.  PT also instructed to notify Dr Clifton James office of increased weight and swelling in fingers and leg.  PT voiced understanding.  PT stated she would notify them today on 07/15/21. PT did call cardiology .  In looking at notes office called her back - please see telephone enounters in epic.  Rica Mast aware of notes in epic and that pt has telehealth visit on 07/18/2021.   ?Called pt this pm and LVMM to see how she was doing.    ? ?

## 2021-07-13 NOTE — Progress Notes (Signed)
DUE TO COVID-19 ONLY  2 vVISITOR IS ALLOWED TO COME WITH YOU AND STAY IN THE WAITING ROOM ONLY DURING PRE OP AND PROCEDURE DAY OF SURGERY.  4  VISITOR  MAY VISIT WITH YOU AFTER SURGERY IN YOUR PRIVATE ROOM DURING VISITING HOURS ONLY! ?YOU MAY HAVE ONE PERSON SPEND THE NITE WITH YOU IN YOUR ROOM AFTER SURGERY.   ? ? ? ? Your procedure is scheduled on:  ?  07/22/2021  ? Report to [facility] Main  Entrance ? ? Report to admitting at    0815            AM ?DO NOT BRING INSURANCE CARD, PICTURE ID OR WALLET DAY OF SURGERY.  ?  ? ? Call this number if you have problems the morning of surgery [phone]  ? ? REMEMBER: NO  SOLID FOODS , CANDY, GUM OR MINTS AFTER MIDNITE THE NITE BEFORE SURGERY .       Marland Kitchen CLEAR LIQUIDS UNTIL     0800am            DAY OF SURGERY.      PLEASE FINISH ENSURE DRINK PER SURGEON ORDER  WHICH NEEDS TO BE COMPLETED AT     0800am       MORNING OF SURGERY.   ? ? ? ? ?CLEAR LIQUID DIET ? ? ?Foods Allowed      ?WATER ?BLACK COFFEE ( SUGAR OK, NO MILK, CREAM OR CREAMER) REGULAR AND DECAF  ?TEA ( SUGAR OK NO MILK, CREAM, OR CREAMER) REGULAR AND DECAF  ?PLAIN JELLO ( NO RED)  ?FRUIT ICES ( NO RED, NO FRUIT PULP)  ?POPSICLES ( NO RED)  ?JUICE- APPLE, WHITE GRAPE AND WHITE CRANBERRY  ?SPORT DRINK LIKE GATORADE ( NO RED)  ?CLEAR BROTH ( VEGETABLE , CHICKEN OR BEEF)                                                               ? ?    ? ?BRUSH YOUR TEETH MORNING OF SURGERY AND RINSE YOUR MOUTH OUT, NO CHEWING GUM CANDY OR MINTS. ?  ? ? Take these medicines the morning of surgery with A SIP OF WATER: Cytomel  ? ? ?DO NOT TAKE ANY DIABETIC MEDICATIONS DAY OF YOUR SURGERY ?                  ?            You may not have any metal on your body including hair pins and  ?            piercings  Do not wear jewelry, make-up, lotions, powders or perfumes, deodorant ?            Do not wear nail polish on your fingernails.   ?           IF YOU ARE A FEMALE AND WANT TO SHAVE UNDER ARMS OR LEGS PRIOR TO SURGERY YOU  MUST DO SO AT LEAST 48 HOURS PRIOR TO SURGERY.  ?            Men may shave face and neck. ? ? Do not bring valuables to the hospital. [facility] IS NOT ?            RESPONSIBLE  FOR VALUABLES. ? Contacts, dentures or bridgework may not be worn into surgery. ? Leave suitcase in the car. After surgery it may be brought to your room. ? ?  ? Patients discharged the day of surgery will not be allowed to drive home. IF YOU ARE HAVING SURGERY AND GOING HOME THE SAME DAY, YOU MUST HAVE AN ADULT TO DRIVE YOU HOME AND BE WITH YOU FOR 24 HOURS. YOU MAY GO HOME BY TAXI OR UBER OR ORTHERWISE, BUT AN ADULT MUST ACCOMPANY YOU HOME AND STAY WITH YOU FOR 24 HOURS. ?  ? ?            Please read over the following fact sheets you were given: ?_____________________________________________________________________ ? ?[facility] - Preparing for Surgery ?Before surgery, you can play an important role.  Because skin is not sterile, your skin needs to be as free of germs as possible.  You can reduce the number of germs on your skin by washing with CHG (chlorahexidine gluconate) soap before surgery.  CHG is an antiseptic cleaner which kills germs and bonds with the skin to continue killing germs even after washing. ?Please DO NOT use if you have an allergy to CHG or antibacterial soaps.  If your skin becomes reddened/irritated stop using the CHG and inform your nurse when you arrive at Short Stay. ?Do not shave (including legs and underarms) for at least 48 hours prior to the first CHG shower.  You may shave your face/neck. ?Please follow these instructions carefully: ? 1.  Shower with CHG Soap the night before surgery and the  morning of Surgery. ? 2.  If you choose to wash your hair, wash your hair first as usual with your  normal  shampoo. ? 3.  After you shampoo, rinse your hair and body thoroughly to remove the  shampoo.                           4.  Use CHG as you would any other liquid soap.  You can apply chg directly  to the skin  and wash  ?                     Gently with a scrungie or clean washcloth. ? 5.  Apply the CHG Soap to your body ONLY FROM THE NECK DOWN.   Do not use on face/ open      ?                     Wound or open sores. Avoid contact with eyes, ears mouth and genitals (private parts).  ?                     Production manager,  Genitals (private parts) with your normal soap. ?            6.  Wash thoroughly, paying special attention to the area where your surgery  will be performed. ? 7.  Thoroughly rinse your body with warm water from the neck down. ? 8.  DO NOT shower/wash with your normal soap after using and rinsing off  the CHG Soap. ?               9.  Pat yourself dry with a clean towel. ?           10.  Wear clean pajamas. ?  11.  Place clean sheets on your bed the night of your first shower and do not  sleep with pets. ?Day of Surgery : ?Do not apply any lotions/deodorants the morning of surgery.  Please wear clean clothes to the hospital/surgery center. ? ?FAILURE TO FOLLOW THESE INSTRUCTIONS MAY RESULT IN THE CANCELLATION OF YOUR SURGERY ?PATIENT SIGNATURE_________________________________ ? ?NURSE SIGNATURE__________________________________ ? ?________________________________________________________________________  ? ? ?           ?

## 2021-07-14 ENCOUNTER — Other Ambulatory Visit: Payer: Self-pay | Admitting: Physician Assistant

## 2021-07-14 DIAGNOSIS — M1712 Unilateral primary osteoarthritis, left knee: Secondary | ICD-10-CM

## 2021-07-15 ENCOUNTER — Other Ambulatory Visit: Payer: Self-pay

## 2021-07-15 ENCOUNTER — Encounter (HOSPITAL_COMMUNITY): Payer: Self-pay

## 2021-07-15 ENCOUNTER — Encounter (HOSPITAL_COMMUNITY)
Admission: RE | Admit: 2021-07-15 | Discharge: 2021-07-15 | Disposition: A | Discharge status: Home / Self Care | Payer: BC Managed Care – PPO | Source: Ambulatory Visit | Attending: Orthopaedic Surgery | Admitting: Orthopaedic Surgery

## 2021-07-15 ENCOUNTER — Telehealth: Payer: Self-pay

## 2021-07-15 ENCOUNTER — Telehealth: Payer: Self-pay | Admitting: Cardiovascular Disease

## 2021-07-15 VITALS — BP 121/68 | HR 80 | Temp 98.1°F | Resp 16 | Ht 66.0 in | Wt 213.0 lb

## 2021-07-15 DIAGNOSIS — Z01812 Encounter for preprocedural laboratory examination: Secondary | ICD-10-CM | POA: Insufficient documentation

## 2021-07-15 DIAGNOSIS — M1712 Unilateral primary osteoarthritis, left knee: Secondary | ICD-10-CM | POA: Diagnosis not present

## 2021-07-15 DIAGNOSIS — Z01818 Encounter for other preprocedural examination: Secondary | ICD-10-CM

## 2021-07-15 HISTORY — DX: Unspecified osteoarthritis, unspecified site: M19.90

## 2021-07-15 HISTORY — DX: Dyspnea, unspecified: R06.00

## 2021-07-15 HISTORY — DX: Heart failure, unspecified: I50.9

## 2021-07-15 HISTORY — DX: Family history of other specified conditions: Z84.89

## 2021-07-15 HISTORY — DX: Personal history of urinary calculi: Z87.442

## 2021-07-15 HISTORY — DX: Pneumonia, unspecified organism: J18.9

## 2021-07-15 HISTORY — DX: Hypothyroidism, unspecified: E03.9

## 2021-07-15 LAB — CBC
HCT: 43.7 % (ref 36.0–46.0)
Hemoglobin: 14.4 g/dL (ref 12.0–15.0)
MCH: 29.4 pg (ref 26.0–34.0)
MCHC: 33 g/dL (ref 30.0–36.0)
MCV: 89.2 fL (ref 80.0–100.0)
Platelets: 249 10*3/uL (ref 150–400)
RBC: 4.9 MIL/uL (ref 3.87–5.11)
RDW: 12.9 % (ref 11.5–15.5)
WBC: 6.7 10*3/uL (ref 4.0–10.5)
nRBC: 0 % (ref 0.0–0.2)

## 2021-07-15 LAB — BASIC METABOLIC PANEL
Anion gap: 8 (ref 5–15)
BUN: 19 mg/dL (ref 8–23)
CO2: 27 mmol/L (ref 22–32)
Calcium: 9.6 mg/dL (ref 8.9–10.3)
Chloride: 106 mmol/L (ref 98–111)
Creatinine, Ser: 0.88 mg/dL (ref 0.44–1.00)
GFR, Estimated: >60 mL/min (ref >60)
Glucose, Bld: 94 mg/dL (ref 70–99)
Potassium: 3.6 mmol/L (ref 3.5–5.1)
Sodium: 141 mmol/L (ref 135–145)

## 2021-07-15 LAB — SURGICAL PCR SCREEN
MRSA, PCR: NEGATIVE
Staphylococcus aureus: POSITIVE — AB

## 2021-07-15 NOTE — Telephone Encounter (Signed)
TELEHEALTH APPOINTMENT SCHEDULED. MEDICATION LIST AND CONSENT COMPLETED.  ?

## 2021-07-15 NOTE — Telephone Encounter (Signed)
? ?  Pre-operative Risk Assessment  ?  ?Patient Name: Maria Becker  ?DOB: 09-02-1960 ?MRN: [id number]  ? ?  ? ?Request for Surgical Clearance   ? ?Procedure:   Left Total Knee Arthroplasty ? ?Date of Surgery:  Clearance 07/22/21                              ?   ?Surgeon:  Dr Jean Rosenthal ?Surgeon's Group or Practice Name:  [facility] ?Phone number:  [phone] ?Fax number:  [phone] ?  ?Type of Clearance Requested:   ?- Medical  ?  ?Type of Anesthesia:  Spinal Block ?  ?Additional requests/questions:   n/a ? ?Signed, ?Ulice Brilliant T   ?07/15/2021, 2:51 PM  ? ?

## 2021-07-15 NOTE — Telephone Encounter (Signed)
Pre-op covering team, it does not look like we have received a clearance form for this. Looks like surgery is scheduled for next Friday. Can you please touch base with patient and let her know that surgeon's office will need to fax [place] a pre-op clearance form? ? ?I will go ahead and remove this from pre-op pool and will wait for official clearance form. ? ?Thank you! ?

## 2021-07-15 NOTE — Telephone Encounter (Signed)
?  Patient Consent for Virtual Visit  ? ? ?    ? ?Maria Becker has provided verbal consent on 07/15/2021 for a virtual visit (video or telephone). ? ? ?CONSENT FOR VIRTUAL VISIT FOR:  Maria Becker  ?By participating in this virtual visit I agree to the following: ? ?I hereby voluntarily request, consent and authorize [facility] and its employed or contracted physicians, physician assistants, nurse practitioners or other licensed health care professionals (the Practitioner), to provide me with telemedicine health care services (the ?Services") as deemed necessary by the treating Practitioner. I acknowledge and consent to receive the Services by the Practitioner via telemedicine. I understand that the telemedicine visit will involve communicating with the Practitioner through live audiovisual communication technology and the disclosure of certain medical information by electronic transmission. I acknowledge that I have been given the opportunity to request an in-person assessment or other available alternative prior to the telemedicine visit and am voluntarily participating in the telemedicine visit. ? ?I understand that I have the right to withhold or withdraw my consent to the use of telemedicine in the course of my care at any time, without affecting my right to future care or treatment, and that the Practitioner or I may terminate the telemedicine visit at any time. I understand that I have the right to inspect all information obtained and/or recorded in the course of the telemedicine visit and may receive copies of available information for a reasonable fee.  I understand that some of the potential risks of receiving the Services via telemedicine include:  ?Delay or interruption in medical evaluation due to technological equipment failure or disruption; ?Information transmitted may not be sufficient (e.g. poor resolution of images) to allow for appropriate medical decision making by the Practitioner;  and/or  ?In rare instances, security protocols could fail, causing a breach of personal health information. ? ?Furthermore, I acknowledge that it is my responsibility to provide information about my medical history, conditions and care that is complete and accurate to the best of my ability. I acknowledge that Practitioner's advice, recommendations, and/or decision may be based on factors not within their control, such as incomplete or inaccurate data provided by me or distortions of diagnostic images or specimens that may result from electronic transmissions. I understand that the practice of medicine is not an exact science and that Practitioner makes no warranties or guarantees regarding treatment outcomes. I acknowledge that a copy of this consent can be made available to me via my patient portal [facility] MyChart), or I can request a printed copy by calling the office of [facility].   ? ?I understand that my insurance will be billed for this visit.  ? ?I have read or had this consent read to me. ?I understand the contents of this consent, which adequately explains the benefits and risks of the Services being provided via telemedicine.  ?I have been provided ample opportunity to ask questions regarding this consent and the Services and have had my questions answered to my satisfaction. ?I give my informed consent for the services to be provided through the use of telemedicine in my medical care ? ? ?

## 2021-07-15 NOTE — Telephone Encounter (Signed)
Pt states that she is having a Knee Replacement done on 07/22/21. Pt wants to know if she needs to stop taking her Aspirin and Lasix. Pt also wants to make provider aware of her weight since last seeing him. Pt says she now weighs 213 lbs. Please advise ?

## 2021-07-15 NOTE — Telephone Encounter (Signed)
Spoke with the patient who states she left a message with the requesting providers office but has not heard back just yet. She states her surgery is next Friday and she wants to know when she needs to stop her blood thinners. She states Dr Clifton James cleared her for her surgery at her last appointment. I explained to the patient that she may be cleared but we still need the clearance info from the surgeons office. She voiced understanding and states she will call back later to see if hear back from the surgeons office.  ?

## 2021-07-15 NOTE — Telephone Encounter (Signed)
? ? ?  Name: Maria Becker  ?DOB: 1960/05/03  ?MRN: [id number] ? ?Primary Cardiologist: Verne Carrow, MD ? ?Pre-op covering team, I see the other phone note from today where it sounds like patient is volume overloaded and was instructed to take an extra dose of Lasix for the next 2 days. So can we set up a phone call appointment early next week with the pre-op APP so we can check and see how she is doing with this and then complete pre-op risk assessment at the same time.  Thank you for your help. ? ?I confirm that guidance regarding antiplatelet and oral anticoagulation therapy has been completed and, if necessary, noted below. ?- Clearance form does not mention the need to hold Plavix. It looks like she only has mild non-obstructive CAD and no history of MI or stroke so if they need to hold Aspirin, this should be fine.  ? ? ?Corrin Parker, PA-C ?07/15/2021, 3:02 PM ?[facility] ?[address] ?[place], [place] [address] ? ? ?

## 2021-07-15 NOTE — Telephone Encounter (Signed)
Spoke w patient. ?She said she has noticed her rings are tight and her ankles are swollen.  She has been feeling SOB the last few days.  Her weight was up to 213 at her PAT appointment today. ? ?She weighs at home but not regularly.  Her home scale weighs her 198 most of the time.  I had her weigh now on the phone and she is 203.   She will start weighing daily each morning and record for her reference.  She doesn't add salt to food she cooks but put pickles on her food yesterday.  She will start to look at food labels for sodium.  ? ?She will take an extra lasix 40 mg today and tomorrow. I asked her to call back on Monday if weight still up or no improvement in breathing. ? ?She has called and messaged the surgeon's office.  I explained that if they need the patient to hold her aspirin, they will let us know.  ? ?Will route to Dr. Clifton James for review and for any further recommendations and if so we will call her back. ?

## 2021-07-18 ENCOUNTER — Ambulatory Visit (INDEPENDENT_AMBULATORY_CARE_PROVIDER_SITE_OTHER): Payer: BC Managed Care – PPO | Admitting: Physician Assistant

## 2021-07-18 DIAGNOSIS — I5033 Acute on chronic diastolic (congestive) heart failure: Secondary | ICD-10-CM

## 2021-07-18 DIAGNOSIS — Z01810 Encounter for preprocedural cardiovascular examination: Secondary | ICD-10-CM | POA: Diagnosis not present

## 2021-07-18 NOTE — Progress Notes (Signed)
? ?Virtual Visit via Telephone Note  ? ?This visit type was conducted due to national recommendations for restrictions regarding the COVID-19 Pandemic (e.g. social distancing) in an effort to limit this patient's exposure and mitigate transmission in our community.  Due to her co-morbid illnesses, this patient is at least at moderate risk for complications without adequate follow up.  This format is felt to be most appropriate for this patient at this time.  The patient did not have access to video technology/had technical difficulties with video requiring transitioning to audio format only (telephone).  All issues noted in this document were discussed and addressed.  No physical exam could be performed with this format.  Please refer to the patient's chart for her  consent to [facility]health [facility]. ? ?Evaluation Performed:  Preoperative cardiovascular risk assessment ?_____________  ? ?Date:  07/18/2021  ? ?Patient ID:  Maria Becker, DOB [birth year], MRN [id number] ?Patient Location:  ?Home ?Provider location:   ?Office ? ?Primary Care Provider:  Benita StabileHall, John Z, MD ?Primary Cardiologist:  Verne Carrowhristopher McAlhany, MD ? ?Chief Complaint  ?  ?61 y.o. y/o female with a h/o CAD, chronic diastolic CHF, MR, GERD, fibromyalgia, hyperlipidemia and HTN , who is pending Left Total Knee Arthroplasty, and presents today for telephonic preoperative cardiovascular risk assessment. ? ?Echo 04/13/21 with LVEF=60-65%. Mild MR.  ?Cardiac cath 04/15/21 with 40% mid LAD stenosis and 40% mid Circumflex stenosis. Medical therapy was recommended. Crestor and ASA started.  ? ?Past Medical History  ?  ?Past Medical History:  ?Diagnosis Date  ? Arthritis   ? 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 CAD (coronary artery disease)   ? a. cath 07/2015: 30-50% mid-LAD stenosis but otherwise normal cors  ? CHF (congestive heart failure) (HCC)   ? Dyspnea   ? SOME SOB WITH EXERTION  ? Family history of adverse reaction to anesthesia   ? MOTHER SLOW TO  WAKE UP  ? Fibromyalgia   ? GERD (gastroesophageal reflux disease)   ? Headache   ? History of kidney stones   ? Hyperlipidemia   ? a. not currently on statin therapy  ? Hypothyroidism   ? Insomnia   ? Pneumonia   ? HX OF AS A BABY  ? ?Past Surgical History:  ?Procedure Laterality Date  ? ABDOMINAL HYSTERECTOMY    ? BACK SURGERY    ? CARDIAC CATHETERIZATION N/A 07/20/2015  ? Procedure: Left Heart Cath and Coronary Angiography;  Surgeon: Lyn RecordsHenry W Smith, MD;  [facility];  Service: Cardiovascular;  Laterality: N/A;  ? CESAREAN SECTION    ? L4-L5 fusion    ? RIGHT/LEFT HEART CATH AND CORONARY ANGIOGRAPHY N/A 04/15/2021  ? Procedure: RIGHT/LEFT HEART CATH AND CORONARY ANGIOGRAPHY;  Surgeon: Kathleene HazelMcAlhany, Christopher D, MD;  Location: [facility];  Service: Cardiovascular;  Laterality: N/A;  ? ? ?Allergies ? ?Allergies  ?Allergen Reactions  ? Gluten Meal Other (See Comments)  ?  Red face  ? Other Other (See Comments)  ?  Dairy cow milk/ ?constipation  ? ? ?History of Present Illness  ?  ?Maria Becker is a 61 y.o. female who presents via audio/video conferencing for a telehealth visit today.  Pt was last seen in cardiology clinic on 05/10/21 by Dr. Clifton JamesMcAlhany.  At that time Maria Becker was doing well.  The patient is now pending procedure as outlined above. Since her last visit, she noted ankle edema and SOB on 5/5. Advised to take extra lasix for  2 days but took 3 days and now back to normal.  This is likely due to noncompliance with salt intake.  Discussed in detail. She is back to her baseline weight of 198 pounds on home scale.  Reported gaining 30 to 40 pound in past 1 year due to limited activity secondary to knee issue. ? ?Currently denies shortness of breath, orthopnea, PND, syncope, lower extremity edema or melena. ? ?Home Medications  ?  ?Prior to Admission medications   ?Medication Sig Start Date End Date Taking? Authorizing Provider  ?aspirin EC 81 MG tablet Take 1 tablet (81 mg total)  by mouth daily. Swallow whole. 04/01/21   Kathleene Hazel, MD  ?b complex vitamins tablet Take 1 tablet by mouth 2 (two) times a week.    [provider]  ?furosemide (LASIX) 40 MG tablet Take 1 tablet (40 mg total) by mouth daily. ?Patient taking differently: Take 40 mg by mouth daily. Take 2 tabs daily for a few days 04/01/21   Kathleene Hazel, MD  ?liothyronine (CYTOMEL) 5 MCG tablet Take 10 mcg by mouth daily. 06/07/15   [provider]  ?MAGNESIUM PO Take 250 mg by mouth at bedtime.    [provider]  ?Multiple Vitamins-Minerals (PRESERVISION AREDS) CAPS Take 1 capsule by mouth daily.    [provider]  ?NON FORMULARY     [provider]  ?Omega-3 Fatty Acids (FISH OIL PO) Take 820 mg by mouth 2 (two) times daily.    [provider]  ?OVER THE COUNTER MEDICATION Take 1 Scoop by mouth 2 (two) times a week. buffered ascorbic acid powder with water    [provider]  ?potassium chloride SA (KLOR-CON M) 20 MEQ tablet Take 1 tablet (20 mEq total) by mouth daily. ?Patient taking differently: Take 20 mEq by mouth at bedtime. 04/18/21   Kathleene Hazel, MD  ?Rimegepant Sulfate (NURTEC) 75 MG TBDP PRN for headaches ?Patient taking differently: Take 1 capsule by mouth daily as needed (Migraine). 05/04/21   Butch Penny, NP  ?rosuvastatin (CRESTOR) 10 MG tablet Take 1 tablet (10 mg total) by mouth daily. ?Patient taking differently: Take 10 mg by mouth at bedtime. 04/01/21   Kathleene Hazel, MD  ?tamsulosin (FLOMAX) 0.4 MG CAPS capsule Take 0.4 mg by mouth daily as needed (Kidnsy stone).    [provider]  ?vitamin B-12 (CYANOCOBALAMIN) 1000 MCG tablet Take 1,000 mcg by mouth daily.    [provider]  ?Vitamin D, Ergocalciferol, (DRISDOL) 1.25 MG (50000 UNIT) CAPS capsule Take 50,000 Units by mouth every Monday.    [provider]  ?VITAMIN E PO Take 50 Units by mouth 2 (two) times a week.     [provider]  ? ? ?Physical Exam  ?  ?Vital Signs:  Maria Becker does not have vital signs available for review today. ? ?Given telephonic nature of communication, physical exam is limited. ?AAOx3. NAD. Normal affect.  Speech and respirations are unlabored. ? ?Accessory Clinical Findings  ?  ?None ? ?Assessment & Plan  ?  ?1.  Preoperative Cardiovascular Risk Assessment: ?Limited activity secondary to knee issue, now requiring surgery.  Recent cardiac cath and echocardiogram reassuring.  Patient will be cleared at acceptable risk without further cardiovascular testing.  Recommended to continue Lasix at peri and postoperative.  No obstructive coronary artery disease.  Okay to hold aspirin for 7 days if needed. ? ?2.  Acute on chronic diastolic heart failure ?This is due to  dietary noncompliance intermittently.  At length discussion regarding watching salt.  Continue Lasix and supplemental potassium at current dose. ? ? ?A copy of this note will be routed to requesting surgeon. ? ?Time:   ?Today, I have spent 13 minutes with the patient with telehealth technology discussing medical history, symptoms, and management plan.   ? ? ?Manson Passey, [place] ? ?07/18/2021, 9:14 AM  ?

## 2021-07-19 NOTE — Progress Notes (Signed)
Anesthesia Chart Review ? ? Case: [id number] Date/Time: 07/22/21 1045  ? Procedure: LEFT TOTAL KNEE ARTHROPLASTY (Left: Knee) - [facility]  ? Anesthesia type: Spinal  ? Pre-op diagnosis: OSTEOARTHRITIS / DEGENERATIVE JOINT DISEASE LEFT KNEE  ? Location: [facility] ROOM 10 / [facility]  ? Surgeons: Maria Hitch, MD  ? ?  ? ? ?DISCUSSION:61 y.o. never smoker with h/o GERD, CAD, chronic diastolic CHF, hypothyroidism, left knee OA scheduled for above procedure 07/22/2021 with Dr. Doneen Becker.  ? ?Per cardiology preoperative evaluation 07/18/2021, "Limited activity secondary to knee issue, now requiring surgery.  Recent cardiac cath and echocardiogram reassuring.  Patient will be cleared at acceptable risk without further cardiovascular testing.  Recommended to continue Lasix at peri and postoperative.  No obstructive coronary artery disease.  Okay to hold aspirin for 7 days if needed." ? ?Anticipate pt can proceed with planned procedure barring acute status change.   ?VS: BP 121/68   Pulse 80   Temp 36.7 ?C (Oral)   Resp 16   Ht 5\' 6"  (1.676 m)   Wt 96.6 kg   SpO2 97%   BMI 34.38 kg/m?  ? ?PROVIDERS: ? , MD is PCP  ? ?Primary Cardiologist:  Maria Stabile, MD ? ?LABS: Labs reviewed: Acceptable for surgery. ?(all labs ordered are listed, but only abnormal results are displayed) ? ?Labs Reviewed  ?SURGICAL PCR SCREEN - Abnormal; Notable for the following components:  ?    Result Value  ? Staphylococcus aureus POSITIVE (*)   ? All other components within normal limits  ?BASIC METABOLIC PANEL  ?CBC  ?TYPE AND SCREEN  ? ? ? ?IMAGES: ? ? ?EKG: ? ? ?CV: ?Cardiac Cath 04/15/21 ?  Mid Cx lesion is 40% stenosed. ?  Mid LAD lesion is 40% stenosed. ?  ?Normal right and left heart pressures ?Mild non-obstructive disease in the mid LAD ?Mild non-obstructive disease in the small caliber mid Circumflex ?No disease in the RCA ?  ?Recommendations: Medical management of CAD. I have advised the patient that her dyspnea  is not likely related to her mild CAD. LV function is normal by echo this week.  ? ?Echo 04/13/21 ? 1. Left ventricular ejection fraction, by estimation, is 60 to 65%. Left  ?ventricular ejection fraction by 3D volume is 62 %. The left ventricle has  ?normal function. The left ventricle has no regional wall motion  ?abnormalities. Left ventricular diastolic  ? parameters were normal. The average left ventricular global longitudinal  ?strain is -25.2 %. The global longitudinal strain is normal.  ? 2. Right ventricular systolic function is normal. The right ventricular  ?size is normal. Tricuspid regurgitation signal is inadequate for assessing  ?PA pressure.  ? 3. The mitral valve is normal in structure. Mild mitral valve  ?regurgitation. No evidence of mitral stenosis.  ? 4. The aortic valve is tricuspid. Aortic valve regurgitation is not  ?visualized. Aortic valve sclerosis is present, with no evidence of aortic  ?valve stenosis.  ? 5. The inferior vena cava is normal in size with greater than 50%  ?respiratory variability, suggesting right atrial pressure of 3 mmHg. ?Past Medical History:  ?Diagnosis Date  ? Arthritis   ? 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 CAD (coronary artery disease)   ? a. cath 07/2015: 30-50% mid-LAD stenosis but otherwise normal cors  ? CHF (congestive heart failure) (HCC)   ? Dyspnea   ? SOME SOB WITH EXERTION  ? Family history of adverse reaction to anesthesia   ?  MOTHER SLOW TO WAKE UP  ? Fibromyalgia   ? GERD (gastroesophageal reflux disease)   ? Headache   ? History of kidney stones   ? Hyperlipidemia   ? a. not currently on statin therapy  ? Hypothyroidism   ? Insomnia   ? Pneumonia   ? HX OF AS A BABY  ? ? ?Past Surgical History:  ?Procedure Laterality Date  ? ABDOMINAL HYSTERECTOMY    ? BACK SURGERY    ? CARDIAC CATHETERIZATION N/A 07/20/2015  ? Procedure: Left Heart Cath and Coronary Angiography;  Surgeon: Maria Records, MD;  Location: [facility] INVASIVE CV LAB;  Service: Cardiovascular;   Laterality: N/A;  ? CESAREAN SECTION    ? L4-L5 fusion    ? RIGHT/LEFT HEART CATH AND CORONARY ANGIOGRAPHY N/A 04/15/2021  ? Procedure: RIGHT/LEFT HEART CATH AND CORONARY ANGIOGRAPHY;  Surgeon: Maria Hazel, MD;  Location: [facility];  Service: Cardiovascular;  Laterality: N/A;  ? ? ?MEDICATIONS: ? aspirin EC 81 MG tablet  ? b complex vitamins tablet  ? furosemide (LASIX) 40 MG tablet  ? liothyronine (CYTOMEL) 5 MCG tablet  ? MAGNESIUM PO  ? Multiple Vitamins-Minerals (PRESERVISION AREDS) CAPS  ? NON FORMULARY  ? Omega-3 Fatty Acids (FISH OIL PO)  ? OVER THE COUNTER MEDICATION  ? potassium chloride SA (KLOR-CON M) 20 MEQ tablet  ? Rimegepant Sulfate (NURTEC) 75 MG TBDP  ? rosuvastatin (CRESTOR) 10 MG tablet  ? tamsulosin (FLOMAX) 0.4 MG CAPS capsule  ? vitamin B-12 (CYANOCOBALAMIN) 1000 MCG tablet  ? Vitamin D, Ergocalciferol, (DRISDOL) 1.25 MG (50000 UNIT) CAPS capsule  ? VITAMIN E PO  ? ? sodium chloride flush (NS) 0.9 % injection 3 mL  ? ? gadopentetate dimeglumine (MAGNEVIST) injection 20 mL  ? ? ?Maria Frerichs Ward, PA-C ?[facility] ?[phone] ? ? ? ? ? ? ?

## 2021-07-19 NOTE — Anesthesia Preprocedure Evaluation (Addendum)
Anesthesia Evaluation  ?Patient identified by MRN, date of birth, ID band ?Patient awake ? ? ? ?Reviewed: ?Allergy & Precautions, NPO status , Patient's Chart, lab work & pertinent test results ? ?Airway ?Mallampati: II ? ?TM Distance: >3 FB ?Neck ROM: Full ? ? ? Dental ? ?(+) Teeth Intact, Dental Advisory Given ?  ?Pulmonary ? ?  ?breath sounds clear to auscultation ? ? ? ? ? ? Cardiovascular ?hypertension, + CAD and +CHF  ? ?Rhythm:Regular Rate:Normal ? ? ?  ?Neuro/Psych ? Headaches, negative psych ROS  ? GI/Hepatic ?Neg liver ROS, GERD  ,  ?Endo/Other  ?Hypothyroidism  ? Renal/GU ?negative Renal ROS  ? ?  ?Musculoskeletal ? ?(+) Arthritis , Fibromyalgia - ? Abdominal ?Normal abdominal exam  (+)   ?Peds ? Hematology ?negative hematology ROS ?(+)   ?Anesthesia Other Findings ? ? Reproductive/Obstetrics ? ?  ? ? ? ? ? ? ? ? ? ? ? ? ? ?  ?  ? ? ? ? ? ? ? ?Anesthesia Physical ?Anesthesia Plan ? ?ASA: 2 ? ?Anesthesia Plan: Spinal  ? ?Post-op Pain Management: Regional block*  ? ?Induction: Intravenous ? ?PONV Risk Score and Plan: 3 and Propofol infusion, Ondansetron and TIVA ? ?Airway Management Planned: Simple Face Mask and Natural Airway ? ?Additional Equipment: None ? ?Intra-op Plan:  ? ?Post-operative Plan:  ? ?Informed Consent: I have reviewed the patients History and Physical, chart, labs and discussed the procedure including the risks, benefits and alternatives for the proposed anesthesia with the patient or authorized representative who has indicated his/her understanding and acceptance.  ? ? ? ? ? ?Plan Discussed with: CRNA ? ?Anesthesia Plan Comments: (See PAT note 07/15/2021)  ? ? ? ? ?Anesthesia Quick Evaluation ? ?

## 2021-07-21 NOTE — H&P (Signed)
TOTAL KNEE ADMISSION H&P ? ?Patient is being admitted for left total knee arthroplasty. ? ?Subjective: ? ?Chief Complaint:left knee pain. ? ?HPI: Maria Becker, 61 y.o. female, has a history of pain and functional disability in the left knee due to arthritis and has failed non-surgical conservative treatments for greater than 12 weeks to includeNSAID's and/or analgesics, corticosteriod injections, viscosupplementation injections, flexibility and strengthening excercises, use of assistive devices, weight reduction as appropriate, and activity modification.  Onset of symptoms was gradual, starting 2 years ago with gradually worsening course since that time. The patient noted no past surgery on the left knee(s).  Patient currently rates pain in the left knee(s) at 10 out of 10 with activity. Patient has night pain, worsening of pain with activity and weight bearing, pain that interferes with activities of daily living, pain with passive range of motion, crepitus, and joint swelling.  Patient has evidence of subchondral sclerosis, periarticular osteophytes, and joint space narrowing by imaging studies. There is no active infection. ? ?Patient Active Problem List  ? Diagnosis Date Noted  ? Unilateral primary osteoarthritis, left knee 06/13/2021  ? Dyspnea on exertion   ? Angina decubitus ([place]) 07/20/2015  ? CAD (coronary artery disease), native coronary artery, LAD on cardiac CT 07/20/2015  ? HTN (hypertension) 07/20/2015  ? Family history of premature CAD 07/20/2015  ? Hyperlipidemia LDL goal <100 07/20/2015  ? Angina at rest [facility]) 07/20/2015  ? Abnormal nuclear stress test   ? 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 ?Past Medical History:  ?Diagnosis Date  ? Arthritis   ? 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 CAD (coronary artery disease)   ? a. cath 07/2015: 30-50% mid-LAD stenosis but otherwise normal cors  ? CHF (congestive heart failure) ([place])   ? Dyspnea   ? SOME SOB WITH EXERTION  ? Family history of adverse  reaction to anesthesia   ? MOTHER SLOW TO WAKE UP  ? Fibromyalgia   ? GERD (gastroesophageal reflux disease)   ? Headache   ? History of kidney stones   ? Hyperlipidemia   ? a. not currently on statin therapy  ? Hypothyroidism   ? Insomnia   ? Pneumonia   ? HX OF AS A BABY  ?  ?Past Surgical History:  ?Procedure Laterality Date  ? ABDOMINAL HYSTERECTOMY    ? BACK SURGERY    ? CARDIAC CATHETERIZATION N/A 07/20/2015  ? Procedure: Left Heart Cath and Coronary Angiography;  Surgeon: Belva Crome, MD;  Location: [facility];  Service: Cardiovascular;  Laterality: N/A;  ? CESAREAN SECTION    ? L4-L5 fusion    ? RIGHT/LEFT HEART CATH AND CORONARY ANGIOGRAPHY N/A 04/15/2021  ? Procedure: RIGHT/LEFT HEART CATH AND CORONARY ANGIOGRAPHY;  Surgeon: Burnell Blanks, MD;  Location: [place] [facility];  Service: Cardiovascular;  Laterality: N/A;  ?  ?Current Facility-Administered Medications  ?Medication Dose Route Frequency Provider Last Rate Last Admin  ? sodium chloride flush (NS) 0.9 % injection 3 mL  3 mL Intravenous Q12H Burnell Blanks, MD      ? ?Current Outpatient Medications  ?Medication Sig Dispense Refill Last Dose  ? aspirin EC 81 MG tablet Take 1 tablet (81 mg total) by mouth daily. Swallow whole. 90 tablet 3   ? b complex vitamins tablet Take 1 tablet by mouth 2 (two) times a week.     ? furosemide (LASIX) 40 MG tablet Take 1 tablet (40 mg total) by mouth daily. (Patient taking differently: Take 40  mg by mouth daily. Take 2 tabs daily for a few days) 90 tablet 3   ? liothyronine (CYTOMEL) 5 MCG tablet Take 10 mcg by mouth daily.     ? MAGNESIUM PO Take 250 mg by mouth at bedtime.     ? Multiple Vitamins-Minerals (PRESERVISION AREDS) CAPS Take 1 capsule by mouth daily.     ? Omega-3 Fatty Acids (FISH OIL PO) Take 820 mg by mouth 2 (two) times daily.     ? OVER THE COUNTER MEDICATION Take 1 Scoop by mouth 2 (two) times a week. buffered ascorbic acid powder with water     ? potassium chloride SA  (KLOR-CON M) 20 MEQ tablet Take 1 tablet (20 mEq total) by mouth daily. (Patient taking differently: Take 20 mEq by mouth at bedtime.) 90 tablet 3   ? Rimegepant Sulfate (NURTEC) 75 MG TBDP PRN for headaches (Patient taking differently: Take 1 capsule by mouth daily as needed (Migraine).) 4 tablet 0   ? rosuvastatin (CRESTOR) 10 MG tablet Take 1 tablet (10 mg total) by mouth daily. (Patient taking differently: Take 10 mg by mouth at bedtime.) 90 tablet 3   ? tamsulosin (FLOMAX) 0.4 MG CAPS capsule Take 0.4 mg by mouth daily as needed ([place]nsy stone).     ? vitamin B-12 (CYANOCOBALAMIN) 1000 MCG tablet Take 1,000 mcg by mouth daily.     ? Vitamin D, Ergocalciferol, (DRISDOL) 1.25 MG (50000 UNIT) CAPS capsule Take 50,000 Units by mouth every Monday.     ? VITAMIN E PO Take 50 Units by mouth 2 (two) times a week.     ? NON FORMULARY      ? ?Facility-Administered Medications Ordered in Other Encounters  ?Medication Dose Route Frequency Provider Last Rate Last Admin  ? gadopentetate dimeglumine (MAGNEVIST) injection 20 mL  20 mL Intravenous Once PRN Kathrynn Ducking, MD      ? ?Allergies  ?Allergen Reactions  ? Gluten Meal Other (See Comments)  ?  Red face  ? Other Other (See Comments)  ?  Dairy cow milk/ ?constipation  ?  ?Social History  ? ?Tobacco Use  ? Smoking status: Never  ? Smokeless tobacco: Never  ?Substance Use Topics  ? Alcohol use: Yes  ?  Comment: occasional  ?  ?Family History  ?Problem Relation Age of Onset  ? COPD Mother   ? Heart attack Mother 26  ? Diabetes Mother   ? Aneurysm Mother   ? Melanoma Father   ? Diabetes Father   ? Diabetes Sister   ? Diabetes Brother   ? Heart attack Brother 77  ? Kidney disease Brother   ? Diabetes Maternal Grandmother   ? Heart disease Maternal Grandmother   ? Diabetes Maternal Grandfather   ? Heart disease Maternal Grandfather   ? Diabetes Paternal Grandmother   ? Heart disease Paternal Grandmother   ? Diabetes Paternal Grandfather   ? Heart disease Paternal Grandfather    ? Cancer - Colon Maternal Aunt   ? Kidney disease Maternal Aunt   ? Diabetes Maternal Aunt   ? Aneurysm Maternal Uncle   ? Hypothyroidism Paternal Aunt   ? Aneurysm Cousin   ?  ? ?Review of Systems  ?Musculoskeletal:  Positive for gait problem and joint swelling.  ?All other systems reviewed and are negative. ? ?Objective: ? ?Physical Exam ?Vitals reviewed.  ?Constitutional:   ?   Appearance: Normal appearance.  ?HENT:  ?   Head: Normocephalic and atraumatic.  ?Eyes:  ?  Extraocular Movements: Extraocular movements intact.  ?   Pupils: Pupils are equal, round, and reactive to light.  ?Cardiovascular:  ?   Rate and Rhythm: Normal rate and regular rhythm.  ?   Pulses: Normal pulses.  ?Pulmonary:  ?   Effort: Pulmonary effort is normal.  ?   Breath sounds: Normal breath sounds.  ?Abdominal:  ?   Palpations: Abdomen is soft.  ?Musculoskeletal:  ?   Cervical back: Normal range of motion and neck supple.  ?   Left knee: Effusion, bony tenderness and crepitus present. Decreased range of motion. Tenderness present over the medial joint line, lateral joint line and patellar tendon. Abnormal alignment.  ?Neurological:  ?   Mental Status: She is alert and oriented to person, place, and time.  ?Psychiatric:     ?   Behavior: Behavior normal.  ? ? ?Vital signs in last 24 hours: ?  ? ?Labs: ? ? ?Estimated body mass index is 34.38 kg/m? as calculated from the following: ?  Height as of 07/15/21: 5\' 6"  (1.676 m). ?  Weight as of 07/15/21: 96.6 kg. ? ? ?Imaging Review ?Plain radiographs demonstrate moderate degenerative joint disease of the left knee(s). MRI shows areas of full-thickness cartilage loss along the weight bearing surfaces. The overall alignment ismild valgus. The bone quality appears to be excellent for age and reported activity level. ? ? ? ? ? ?Assessment/Plan: ? ?End stage arthritis, left knee  ? ?The patient history, physical examination, clinical judgment of the provider and imaging studies are consistent with end  stage degenerative joint disease of the left knee(s) and total knee arthroplasty is deemed medically necessary. The treatment options including medical management, injection therapy arthroscopy and art

## 2021-07-22 ENCOUNTER — Encounter (HOSPITAL_COMMUNITY): Payer: Self-pay | Admitting: Orthopaedic Surgery

## 2021-07-22 ENCOUNTER — Encounter (HOSPITAL_COMMUNITY)
Admission: RE | Disposition: A | Discharge status: Home / Self Care | Payer: Self-pay | Source: Home / Self Care | Attending: Orthopaedic Surgery

## 2021-07-22 ENCOUNTER — Ambulatory Visit (HOSPITAL_COMMUNITY): Payer: BC Managed Care – PPO | Admitting: Emergency Medicine

## 2021-07-22 ENCOUNTER — Other Ambulatory Visit: Payer: Self-pay

## 2021-07-22 ENCOUNTER — Ambulatory Visit (HOSPITAL_COMMUNITY): Payer: BC Managed Care – PPO | Admitting: Certified Registered Nurse Anesthetist

## 2021-07-22 ENCOUNTER — Observation Stay (HOSPITAL_COMMUNITY): Payer: BC Managed Care – PPO

## 2021-07-22 ENCOUNTER — Observation Stay (HOSPITAL_COMMUNITY)
Admission: RE | Admit: 2021-07-22 | Discharge: 2021-07-24 | Disposition: A | Discharge status: Home / Self Care | Payer: BC Managed Care – PPO | Attending: Orthopaedic Surgery | Admitting: Orthopaedic Surgery

## 2021-07-22 DIAGNOSIS — Z96652 Presence of left artificial knee joint: Secondary | ICD-10-CM | POA: Diagnosis not present

## 2021-07-22 DIAGNOSIS — Z471 Aftercare following joint replacement surgery: Secondary | ICD-10-CM | POA: Diagnosis not present

## 2021-07-22 DIAGNOSIS — I509 Heart failure, unspecified: Secondary | ICD-10-CM | POA: Diagnosis not present

## 2021-07-22 DIAGNOSIS — Z01818 Encounter for other preprocedural examination: Secondary | ICD-10-CM

## 2021-07-22 DIAGNOSIS — E039 Hypothyroidism, unspecified: Secondary | ICD-10-CM | POA: Diagnosis not present

## 2021-07-22 DIAGNOSIS — Z7982 Long term (current) use of aspirin: Secondary | ICD-10-CM | POA: Diagnosis not present

## 2021-07-22 DIAGNOSIS — Z79899 Other long term (current) drug therapy: Secondary | ICD-10-CM | POA: Insufficient documentation

## 2021-07-22 DIAGNOSIS — I2510 Atherosclerotic heart disease of native coronary artery without angina pectoris: Secondary | ICD-10-CM | POA: Insufficient documentation

## 2021-07-22 DIAGNOSIS — M1712 Unilateral primary osteoarthritis, left knee: Principal | ICD-10-CM | POA: Diagnosis present

## 2021-07-22 DIAGNOSIS — G8918 Other acute postprocedural pain: Secondary | ICD-10-CM | POA: Diagnosis not present

## 2021-07-22 DIAGNOSIS — I110 Hypertensive heart disease with heart failure: Secondary | ICD-10-CM | POA: Diagnosis not present

## 2021-07-22 HISTORY — PX: TOTAL KNEE ARTHROPLASTY: SHX125

## 2021-07-22 LAB — TYPE AND SCREEN
ABO/RH(D): AB POS
Antibody Screen: NEGATIVE

## 2021-07-22 LAB — GLUCOSE, CAPILLARY: Glucose-Capillary: 105 mg/dL — ABNORMAL HIGH (ref 70–99)

## 2021-07-22 SURGERY — ARTHROPLASTY, KNEE, TOTAL
Anesthesia: Spinal | Site: Knee | Laterality: Left

## 2021-07-22 MED ORDER — TRANEXAMIC ACID-NACL 1000-0.7 MG/100ML-% IV SOLN
1000.0000 mg | INTRAVENOUS | Status: AC
  Administered 2021-07-22: 1000 mg via INTRAVENOUS
  Filled 2021-07-22: qty 100

## 2021-07-22 MED ORDER — PROPOFOL 500 MG/50ML IV EMUL
INTRAVENOUS | Status: DC | PRN
Start: 2021-07-22 — End: 2021-07-22
  Administered 2021-07-22: 125 ug/kg/min via INTRAVENOUS

## 2021-07-22 MED ORDER — PHENYLEPHRINE HCL (PRESSORS) 10 MG/ML IV SOLN
INTRAVENOUS | Status: AC
  Filled 2021-07-22: qty 1

## 2021-07-22 MED ORDER — POTASSIUM CHLORIDE CRYS ER 20 MEQ PO TBCR
20.0000 meq | EXTENDED_RELEASE_TABLET | Freq: Every day | ORAL | Status: DC
  Administered 2021-07-23: 20 meq via ORAL
  Filled 2021-07-22 (×2): qty 1

## 2021-07-22 MED ORDER — METOCLOPRAMIDE HCL 5 MG/ML IJ SOLN
5.0000 mg | Freq: Three times a day (TID) | INTRAMUSCULAR | Status: DC | PRN
  Administered 2021-07-23: 10 mg via INTRAVENOUS
  Filled 2021-07-22: qty 2

## 2021-07-22 MED ORDER — BUPIVACAINE-EPINEPHRINE 0.25% -1:200000 IJ SOLN
INTRAMUSCULAR | Status: DC | PRN
Start: 2021-07-22 — End: 2021-07-22
  Administered 2021-07-22: 30 mL

## 2021-07-22 MED ORDER — ONDANSETRON HCL 4 MG/2ML IJ SOLN
4.0000 mg | Freq: Four times a day (QID) | INTRAMUSCULAR | Status: DC | PRN
  Administered 2021-07-22 – 2021-07-23 (×2): 4 mg via INTRAVENOUS
  Filled 2021-07-22 (×2): qty 2

## 2021-07-22 MED ORDER — HYDROMORPHONE HCL 1 MG/ML IJ SOLN
1.0000 mg | INTRAMUSCULAR | Status: DC | PRN
  Administered 2021-07-22 (×2): 1 mg via INTRAVENOUS
  Administered 2021-07-23: 2 mg via INTRAVENOUS
  Filled 2021-07-22 (×2): qty 1
  Filled 2021-07-22: qty 2

## 2021-07-22 MED ORDER — ROPIVACAINE HCL 5 MG/ML IJ SOLN
INTRAMUSCULAR | Status: DC | PRN
  Administered 2021-07-22: 30 mL via PERINEURAL

## 2021-07-22 MED ORDER — MENTHOL 3 MG MT LOZG: 1.0000 | LOZENGE | OROMUCOSAL | Status: DC | PRN

## 2021-07-22 MED ORDER — HYDROMORPHONE HCL 2 MG PO TABS
2.0000 mg | ORAL_TABLET | ORAL | Status: DC | PRN
  Administered 2021-07-22 – 2021-07-23 (×2): 2 mg via ORAL
  Filled 2021-07-22: qty 1

## 2021-07-22 MED ORDER — OXYCODONE HCL 5 MG PO TABS
5.0000 mg | ORAL_TABLET | ORAL | Status: DC | PRN
  Administered 2021-07-22 – 2021-07-23 (×2): 10 mg via ORAL
  Filled 2021-07-22 (×3): qty 2

## 2021-07-22 MED ORDER — LACTATED RINGERS IV SOLN: INTRAVENOUS | Status: DC

## 2021-07-22 MED ORDER — PROPOFOL 1000 MG/100ML IV EMUL
INTRAVENOUS | Status: AC
  Filled 2021-07-22: qty 100

## 2021-07-22 MED ORDER — PHENOL 1.4 % MT LIQD: 1.0000 | OROMUCOSAL | Status: DC | PRN

## 2021-07-22 MED ORDER — LIDOCAINE HCL (PF) 2 % IJ SOLN
INTRAMUSCULAR | Status: AC
  Filled 2021-07-22: qty 5

## 2021-07-22 MED ORDER — VITAMIN D (ERGOCALCIFEROL) 1.25 MG (50000 UNIT) PO CAPS: 50000.0000 [IU] | ORAL_CAPSULE | ORAL | Status: DC

## 2021-07-22 MED ORDER — PHENYLEPHRINE HCL-NACL 20-0.9 MG/250ML-% IV SOLN
INTRAVENOUS | Status: DC | PRN
Start: 2021-07-22 — End: 2021-07-22
  Administered 2021-07-22: 35 ug/min via INTRAVENOUS

## 2021-07-22 MED ORDER — CHLORHEXIDINE GLUCONATE 0.12 % MT SOLN
15.0000 mL | Freq: Once | OROMUCOSAL | Status: AC
  Administered 2021-07-22: 15 mL via OROMUCOSAL

## 2021-07-22 MED ORDER — BUPIVACAINE-EPINEPHRINE (PF) 0.25% -1:200000 IJ SOLN
INTRAMUSCULAR | Status: AC
  Filled 2021-07-22: qty 30

## 2021-07-22 MED ORDER — METHOCARBAMOL 500 MG PO TABS
500.0000 mg | ORAL_TABLET | Freq: Four times a day (QID) | ORAL | Status: DC | PRN
  Administered 2021-07-22 – 2021-07-23 (×3): 500 mg via ORAL
  Filled 2021-07-22 (×3): qty 1

## 2021-07-22 MED ORDER — STERILE WATER FOR IRRIGATION IR SOLN
Status: DC | PRN
  Administered 2021-07-22: 2000 mL

## 2021-07-22 MED ORDER — SODIUM CHLORIDE 0.9 % IR SOLN
Status: DC | PRN
  Administered 2021-07-22: 1000 mL

## 2021-07-22 MED ORDER — HYDROMORPHONE HCL 2 MG PO TABS
ORAL_TABLET | ORAL | Status: AC
  Filled 2021-07-22: qty 1

## 2021-07-22 MED ORDER — BUPIVACAINE IN DEXTROSE 0.75-8.25 % IT SOLN
INTRATHECAL | Status: DC | PRN
Start: 2021-07-22 — End: 2021-07-22
  Administered 2021-07-22: 1.6 mL via INTRATHECAL

## 2021-07-22 MED ORDER — 0.9 % SODIUM CHLORIDE (POUR BTL) OPTIME
TOPICAL | Status: DC | PRN
Start: 2021-07-22 — End: 2021-07-22
  Administered 2021-07-22: 1000 mL

## 2021-07-22 MED ORDER — PANTOPRAZOLE SODIUM 40 MG PO TBEC
40.0000 mg | DELAYED_RELEASE_TABLET | Freq: Every day | ORAL | Status: DC
  Administered 2021-07-22 – 2021-07-24 (×3): 40 mg via ORAL
  Filled 2021-07-22 (×3): qty 1

## 2021-07-22 MED ORDER — CEFAZOLIN SODIUM-DEXTROSE 2-4 GM/100ML-% IV SOLN
2.0000 g | INTRAVENOUS | Status: AC
  Administered 2021-07-22: 2 g via INTRAVENOUS
  Filled 2021-07-22: qty 100

## 2021-07-22 MED ORDER — FENTANYL CITRATE PF 50 MCG/ML IJ SOSY
50.0000 ug | PREFILLED_SYRINGE | INTRAMUSCULAR | Status: DC
  Administered 2021-07-22: 50 ug via INTRAVENOUS
  Filled 2021-07-22: qty 2

## 2021-07-22 MED ORDER — MIDAZOLAM HCL 2 MG/2ML IJ SOLN
1.0000 mg | INTRAMUSCULAR | Status: DC
  Administered 2021-07-22: 2 mg via INTRAVENOUS
  Filled 2021-07-22 (×2): qty 2

## 2021-07-22 MED ORDER — EPHEDRINE SULFATE-NACL 50-0.9 MG/10ML-% IV SOSY
PREFILLED_SYRINGE | INTRAVENOUS | Status: DC | PRN
  Administered 2021-07-22 (×2): 5 mg via INTRAVENOUS

## 2021-07-22 MED ORDER — PROPOFOL 10 MG/ML IV BOLUS
INTRAVENOUS | Status: DC | PRN
  Administered 2021-07-22: 30 mg via INTRAVENOUS

## 2021-07-22 MED ORDER — DOCUSATE SODIUM 100 MG PO CAPS
100.0000 mg | ORAL_CAPSULE | Freq: Two times a day (BID) | ORAL | Status: DC
  Administered 2021-07-22 – 2021-07-24 (×4): 100 mg via ORAL
  Filled 2021-07-22 (×4): qty 1

## 2021-07-22 MED ORDER — METHOCARBAMOL 500 MG IVPB - SIMPLE MED
500.0000 mg | Freq: Four times a day (QID) | INTRAVENOUS | Status: DC | PRN
  Filled 2021-07-22: qty 50

## 2021-07-22 MED ORDER — ORAL CARE MOUTH RINSE: 15.0000 mL | Freq: Once | OROMUCOSAL | Status: AC

## 2021-07-22 MED ORDER — CEFAZOLIN SODIUM-DEXTROSE 1-4 GM/50ML-% IV SOLN
1.0000 g | Freq: Four times a day (QID) | INTRAVENOUS | Status: AC
  Administered 2021-07-22 – 2021-07-23 (×2): 1 g via INTRAVENOUS
  Filled 2021-07-22 (×2): qty 50

## 2021-07-22 MED ORDER — LIOTHYRONINE SODIUM 5 MCG PO TABS
10.0000 ug | ORAL_TABLET | Freq: Every day | ORAL | Status: DC
  Administered 2021-07-23 – 2021-07-24 (×2): 10 ug via ORAL
  Filled 2021-07-22 (×2): qty 2

## 2021-07-22 MED ORDER — DIPHENHYDRAMINE HCL 12.5 MG/5ML PO ELIX: 12.5000 mg | ORAL_SOLUTION | ORAL | Status: DC | PRN

## 2021-07-22 MED ORDER — ONDANSETRON HCL 4 MG PO TABS
4.0000 mg | ORAL_TABLET | Freq: Four times a day (QID) | ORAL | Status: DC | PRN
  Administered 2021-07-23 – 2021-07-24 (×3): 4 mg via ORAL
  Filled 2021-07-22 (×5): qty 1

## 2021-07-22 MED ORDER — ALUM & MAG HYDROXIDE-SIMETH 200-200-20 MG/5ML PO SUSP: 30.0000 mL | ORAL | Status: DC | PRN

## 2021-07-22 MED ORDER — ASPIRIN 81 MG PO CHEW
81.0000 mg | CHEWABLE_TABLET | Freq: Two times a day (BID) | ORAL | Status: DC
  Administered 2021-07-22 – 2021-07-24 (×4): 81 mg via ORAL
  Filled 2021-07-22 (×4): qty 1

## 2021-07-22 MED ORDER — SODIUM CHLORIDE 0.9 % IV SOLN: INTRAVENOUS | Status: DC

## 2021-07-22 MED ORDER — ONDANSETRON HCL 4 MG/2ML IJ SOLN
INTRAMUSCULAR | Status: AC
  Filled 2021-07-22: qty 2

## 2021-07-22 MED ORDER — ACETAMINOPHEN 325 MG PO TABS: 325.0000 mg | ORAL_TABLET | Freq: Four times a day (QID) | ORAL | Status: DC | PRN

## 2021-07-22 MED ORDER — HYDROMORPHONE HCL 1 MG/ML IJ SOLN: 0.5000 mg | INTRAMUSCULAR | Status: DC | PRN

## 2021-07-22 MED ORDER — PROPOFOL 500 MG/50ML IV EMUL
INTRAVENOUS | Status: AC
  Filled 2021-07-22: qty 50

## 2021-07-22 MED ORDER — ONDANSETRON HCL 4 MG/2ML IJ SOLN
INTRAMUSCULAR | Status: DC | PRN
  Administered 2021-07-22: 4 mg via INTRAVENOUS

## 2021-07-22 MED ORDER — METOCLOPRAMIDE HCL 5 MG PO TABS
5.0000 mg | ORAL_TABLET | Freq: Three times a day (TID) | ORAL | Status: DC | PRN
  Administered 2021-07-23 – 2021-07-24 (×2): 10 mg via ORAL
  Filled 2021-07-22 (×3): qty 2

## 2021-07-22 MED ORDER — KETOROLAC TROMETHAMINE 15 MG/ML IJ SOLN
7.5000 mg | Freq: Four times a day (QID) | INTRAMUSCULAR | Status: AC
Start: 2021-07-22 — End: 2021-07-23
  Administered 2021-07-22 – 2021-07-23 (×3): 7.5 mg via INTRAVENOUS
  Filled 2021-07-22 (×3): qty 1

## 2021-07-22 MED ORDER — POVIDONE-IODINE 10 % EX SWAB
2.0000 "application " | Freq: Once | CUTANEOUS | Status: AC
  Administered 2021-07-22: 2 via TOPICAL

## 2021-07-22 SURGICAL SUPPLY — 59 items
BAG COUNTER SPONGE SURGICOUNT (BAG) IMPLANT
BAG ZIPLOCK 12X15 (MISCELLANEOUS) ×2 IMPLANT
BENZOIN TINCTURE PRP APPL 2/3 (GAUZE/BANDAGES/DRESSINGS) IMPLANT
BLADE SAG 18X100X1.27 (BLADE) ×2 IMPLANT
BLADE SURG SZ10 CARB STEEL (BLADE) ×4 IMPLANT
BNDG ELASTIC 6X5.8 VLCR STR LF (GAUZE/BANDAGES/DRESSINGS) ×3 IMPLANT
BOWL SMART MIX CTS (DISPOSABLE) IMPLANT
CEMENT BONE R 1X40 (Cement) ×2 IMPLANT
COOLER ICEMAN CLASSIC (MISCELLANEOUS) ×2 IMPLANT
COVER SURGICAL LIGHT HANDLE (MISCELLANEOUS) ×2 IMPLANT
CUFF TOURN SGL QUICK 34 (TOURNIQUET CUFF) ×2
CUFF TRNQT CYL 34X4.125X (TOURNIQUET CUFF) ×1 IMPLANT
DRAPE INCISE IOBAN 66X45 STRL (DRAPES) ×2 IMPLANT
DRAPE U-SHAPE 47X51 STRL (DRAPES) ×2 IMPLANT
DRSG PAD ABDOMINAL 8X10 ST (GAUZE/BANDAGES/DRESSINGS) ×3 IMPLANT
DURAPREP 26ML APPLICATOR (WOUND CARE) ×2 IMPLANT
ELECT BLADE TIP CTD 4 INCH (ELECTRODE) ×2 IMPLANT
ELECT REM PT RETURN 15FT ADLT (MISCELLANEOUS) ×2 IMPLANT
GAUZE SPONGE 4X4 12PLY STRL (GAUZE/BANDAGES/DRESSINGS) ×2 IMPLANT
GAUZE XEROFORM 1X8 LF (GAUZE/BANDAGES/DRESSINGS) ×1 IMPLANT
GLOVE BIO SURGEON STRL SZ7.5 (GLOVE) ×2 IMPLANT
GLOVE BIOGEL PI IND STRL 8 (GLOVE) ×2 IMPLANT
GLOVE BIOGEL PI INDICATOR 8 (GLOVE) ×2
GLOVE ECLIPSE 8.0 STRL XLNG CF (GLOVE) ×2 IMPLANT
GOWN STRL REUS W/ TWL XL LVL3 (GOWN DISPOSABLE) ×2 IMPLANT
GOWN STRL REUS W/TWL XL LVL3 (GOWN DISPOSABLE) ×4
HANDPIECE INTERPULSE COAX TIP (DISPOSABLE) ×2
HDLS TROCR DRIL PIN KNEE 75 (PIN) ×2
HOLDER FOLEY CATH W/STRAP (MISCELLANEOUS) IMPLANT
IMMOBILIZER KNEE 20 (SOFTGOODS) ×2
IMMOBILIZER KNEE 20 THIGH 36 (SOFTGOODS) ×1 IMPLANT
INSERT TIB ASF 14 6-7/CD LT (Insert) ×1 IMPLANT
KIT TURNOVER KIT A (KITS) IMPLANT
KNEE SYSTEM FEMUR SZ 6 LT (Knees) ×1 IMPLANT
NS IRRIG 1000ML POUR BTL (IV SOLUTION) ×2 IMPLANT
PACK TOTAL KNEE CUSTOM (KITS) ×2 IMPLANT
PAD COLD SHLDR WRAP-ON (PAD) ×2 IMPLANT
PADDING CAST ABS 6INX4YD NS (CAST SUPPLIES) ×1
PADDING CAST ABS COTTON 6X4 NS (CAST SUPPLIES) IMPLANT
PADDING CAST COTTON 6X4 STRL (CAST SUPPLIES) ×4 IMPLANT
PIN DRILL HDLS TROCAR 75 4PK (PIN) IMPLANT
PROTECTOR NERVE ULNAR (MISCELLANEOUS) ×2 IMPLANT
SCREW FEMALE HEX FIX 25X2.5 (ORTHOPEDIC DISPOSABLE SUPPLIES) ×1 IMPLANT
SET HNDPC FAN SPRY TIP SCT (DISPOSABLE) ×1 IMPLANT
SET PAD KNEE POSITIONER (MISCELLANEOUS) ×2 IMPLANT
SPIKE FLUID TRANSFER (MISCELLANEOUS) IMPLANT
STAPLER VISISTAT 35W (STAPLE) IMPLANT
STEM POLY PAT PLY 29M KNEE (Knees) ×1 IMPLANT
STEM TIBIA 5 DEG SZ C L KNEE (Knees) IMPLANT
STRIP CLOSURE SKIN 1/2X4 (GAUZE/BANDAGES/DRESSINGS) IMPLANT
SUT MNCRL AB 4-0 PS2 18 (SUTURE) IMPLANT
SUT VIC AB 0 CT1 27 (SUTURE) ×2
SUT VIC AB 0 CT1 27XBRD ANTBC (SUTURE) ×1 IMPLANT
SUT VIC AB 1 CT1 36 (SUTURE) ×4 IMPLANT
SUT VIC AB 2-0 CT1 27 (SUTURE) ×6
SUT VIC AB 2-0 CT1 TAPERPNT 27 (SUTURE) ×2 IMPLANT
TIBIA STEM 5 DEG SZ C L KNEE (Knees) ×2 IMPLANT
TRAY FOLEY MTR SLVR 16FR STAT (SET/KITS/TRAYS/PACK) IMPLANT
WATER STERILE IRR 1000ML POUR (IV SOLUTION) ×4 IMPLANT

## 2021-07-22 NOTE — Plan of Care (Signed)
  Problem: Pain Management: Goal: Pain level will decrease with appropriate interventions Outcome: Progressing   

## 2021-07-22 NOTE — Anesthesia Procedure Notes (Signed)
Spinal ? ?Patient location during procedure: OR ?End time: 07/22/2021 11:11 AM ?Reason for block: surgical anesthesia ?Staffing ?Performed: resident/CRNA  ?Anesthesiologist: Effie Berkshire, MD ?Resident/CRNA: Maxwell Caul, CRNA ?Preanesthetic Checklist ?Completed: patient identified, IV checked, site marked, risks and benefits discussed, surgical consent, monitors and equipment checked, pre-op evaluation and timeout performed ?Spinal Block ?Patient position: sitting ?Prep: DuraPrep ?Patient monitoring: heart rate, cardiac monitor, continuous pulse ox and blood pressure ?Approach: midline ?Location: L3-4 ?Injection technique: single-shot ?Needle ?Needle type: Pencan  ?Needle gauge: 24 G ?Needle length: 10 cm ?Assessment ?Sensory level: T4 ?Events: CSF return ?Additional Notes ?IV functioning, monitors applied to pt. Expiration date of kit checked and confirmed to be in date. Sterile prep and drape, hand hygiene and sterile gloves used. Pt was positioned and spine was prepped in sterile fashion. Skin was anesthetized with lidocaine. Free flow of clear CSF obtained prior to injecting local anesthetic into CSF x 1 attempt. Spinal needle aspirated freely following injection. Needle was carefully withdrawn, and pt tolerated procedure well. Loss of motor and sensory on exam post injection. Dr Smith Robert at bedside for entire placement. ? ? ? ?

## 2021-07-22 NOTE — Brief Op Note (Signed)
07/22/2021 ? ?12:49 PM ? ?PATIENT:  Maria Becker  61 y.o. female ? ?PRE-OPERATIVE DIAGNOSIS:  OSTEOARTHRITIS / DEGENERATIVE JOINT DISEASE LEFT KNEE ? ?POST-OPERATIVE DIAGNOSIS:  OSTEOARTHRITIS / DEGENERATIVE JOINT  ? ?PROCEDURE:  Procedure(s) with comments: ?LEFT TOTAL KNEE ARTHROPLASTY (Left) - RFNA ? ?SURGEON:  Surgeon(s) and Role: ?   Kathryne Hitch, MD - Primary ? ?ANESTHESIA:   local, regional, and spinal ? ?COUNTS:  YES ? ?TOURNIQUET:   ?Total Tourniquet Time Documented: ?Thigh (Left) - 53 minutes ?Total: Thigh (Left) - 53 minutes ? ? ?DICTATION: .Other Dictation: Dictation Number [id number] ? ?PLAN OF CARE: Admit for overnight observation ? ?PATIENT DISPOSITION:  PACU - hemodynamically stable. ?  ?Delay start of Pharmacological VTE agent (>24hrs) due to surgical blood loss or risk of bleeding: no ? ?

## 2021-07-22 NOTE — Anesthesia Procedure Notes (Signed)
Anesthesia Regional Block: Adductor canal block  ? ?Pre-Anesthetic Checklist: , timeout performed,  Correct Patient, Correct Site, Correct Laterality,  Correct Procedure, Correct Position, site marked,  Risks and benefits discussed,  Surgical consent,  Pre-op evaluation,  At surgeon's request and post-op pain management ? ?Laterality: Left ? ?Prep: chloraprep     ?  ?Needles:  ?Injection technique: Single-shot ? ?Needle Type: Echogenic Stimulator Needle   ? ? ?Needle Length: 9cm  ?Needle Gauge: 21  ? ? ? ?Additional Needles: ? ? ?Procedures:,,,, ultrasound used (permanent image in chart),,    ?Narrative:  ?Start time: 07/22/2021 10:17 AM ?End time: 07/22/2021 10:22 AM ?Injection made incrementally with aspirations every 5 mL. ? ?Performed by: Personally  ?Anesthesiologist: Shelton Silvas, MD ? ?Additional Notes: ?Patient tolerated the procedure well. Local anesthetic introduced in an incremental fashion under minimal resistance after negative aspirations. No paresthesias were elicited. After completion of the procedure, no acute issues were identified and patient continued to be monitored by RN.  ? ? ? ? ? ?

## 2021-07-22 NOTE — Transfer of Care (Signed)
Immediate Anesthesia Transfer of Care Note ? ?Patient: Maria Becker ? ?Procedure(s) Performed: LEFT TOTAL KNEE ARTHROPLASTY (Left: Knee) ? ?Patient Location: PACU ? ?Anesthesia Type:MAC ? ?Level of Consciousness: awake, alert ? ?Airway & Oxygen Therapy: Patient Spontanous Breathing and Patient connected to face mask oxygen ? ?Post-op Assessment: Report given to RN and Post -op Vital signs reviewed and stable ? ?Post vital signs: Reviewed and stable ? ?Last Vitals:  ?Vitals Value Taken Time  ?BP 112/77 07/22/21 1315  ?Temp    ?Pulse 82 07/22/21 1320  ?Resp 14 07/22/21 1320  ?SpO2 97 % 07/22/21 1320  ?Vitals shown include unvalidated device data. ? ?Last Pain:  ?Vitals:  ? 07/22/21 0849  ?TempSrc:   ?PainSc: 0-No pain  ?   ? ?Patients Stated Pain Goal: 4 (07/22/21 0849) ? ?Complications: No notable events documented. ?

## 2021-07-22 NOTE — Anesthesia Postprocedure Evaluation (Signed)
Anesthesia Post Note ? ?Patient: Maria Becker ? ?Procedure(s) Performed: LEFT TOTAL KNEE ARTHROPLASTY (Left: Knee) ? ?  ? ?Patient location during evaluation: PACU ?Anesthesia Type: Spinal ?Level of consciousness: oriented and awake and alert ?Pain management: pain level controlled ?Vital Signs Assessment: post-procedure vital signs reviewed and stable ?Respiratory status: spontaneous breathing, respiratory function stable and patient connected to nasal cannula oxygen ?Cardiovascular status: blood pressure returned to baseline and stable ?Postop Assessment: no headache, no backache and no apparent nausea or vomiting ?Anesthetic complications: no ? ? ?No notable events documented. ? ?Last Vitals:  ?Vitals:  ? 07/22/21 1415 07/22/21 1443  ?BP: 118/81 135/80  ?Pulse: 73 72  ?Resp: 15 17  ?Temp: 36.6 ?C 36.4 ?C  ?SpO2: 95% 95%  ?   ?  ? ?Shelton Silvas ? ? ? ? ?

## 2021-07-22 NOTE — Plan of Care (Signed)

## 2021-07-22 NOTE — Anesthesia Procedure Notes (Signed)
Procedure Name: Dana ?Date/Time: 07/22/2021 11:05 AM ?Performed by: Maxwell Caul, CRNA ?Pre-anesthesia Checklist: Patient identified, Emergency Drugs available, Suction available and Patient being monitored ?Oxygen Delivery Method: Simple face mask ? ? ? ? ?

## 2021-07-22 NOTE — Evaluation (Signed)
Physical Therapy Evaluation ?Patient Details ?Name: Maria LoganDonna C Becker ?MRN: [id number] ?DOB: [birth year] ?Today's Date: 07/22/2021 ? ?History of Present Illness ? Pt is a 61yo female presenting s/p L-TKA on 07/22/21. 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  ?Clinical Impression ? Maria LoganDonna C Becker is a 61 y.o. female POD 0 s/p L-TKA. Patient reports independence with mobility at baseline. Patient is now limited by functional impairments (see PT problem list below) and requires min assist for transfers bed mobility Patient was able to ambulate 15 feet with RW and min guard assist. Patient instructed in exercise to facilitate ROM and circulation to manage edema. Patient will benefit from continued skilled PT interventions to address impairments and progress towards PLOF. Acute PT will follow to progress mobility and stair training in preparation for safe discharge home.   ?   ? ?Recommendations for follow up therapy are one component of a multi-disciplinary discharge planning process, led by the attending physician.  Recommendations may be updated based on patient status, additional functional criteria and insurance authorization. ? ?Follow Up Recommendations Follow physician's recommendations for discharge plan and follow up therapies ? ?  ?Assistance Recommended at Discharge Intermittent Supervision/Assistance  ?Patient can return home with the following ? A little help with walking and/or transfers;A little help with bathing/dressing/bathroom;Assistance with cooking/housework;Assist for transportation;Help with stairs or ramp for entrance ? ?  ?Equipment Recommendations None recommended by PT  ?Recommendations for Other Services ?    ?  ?Functional Status Assessment Patient has had a recent decline in their functional status and demonstrates the ability to make significant improvements in function in a reasonable and predictable amount of time.  ? ?  ?Precautions / Restrictions  Precautions ?Precautions: Fall ?Required Braces or Orthoses: Knee Immobilizer - Left ?Knee Immobilizer - Left: Other (comment) (pt could not complete SLR so used KI for increased safety and stability.) ?Restrictions ?Weight Bearing Restrictions: Yes ?LLE Weight Bearing: Weight bearing as tolerated  ? ?  ? ?Mobility ? Bed Mobility ?Overal bed mobility: Needs Assistance ?Bed Mobility: Supine to Sit ?  ?  ?Supine to sit: Min assist ?  ?  ?General bed mobility comments: Pt min assist to bring LLE off bed, otherwise min guard. ?  ? ?Transfers ?Overall transfer level: Needs assistance ?Equipment used: Rolling walker (2 wheels) ?Transfers: Sit to/from Stand ?Sit to Stand: From elevated surface, Min guard ?  ?  ?  ?  ?  ?General transfer comment: Min guard for safety only from elevated surface, no physical assist required. ?  ? ?Ambulation/Gait ?Ambulation/Gait assistance: Min guard, +2 safety/equipment ?Gait Distance (Feet): 15 Feet ?Assistive device: Rolling walker (2 wheels) ?Gait Pattern/deviations: Step-to pattern, Trunk flexed ?Gait velocity: decreased ?  ?  ?General Gait Details: Pt ambulated with RW and min guard, no physical assist required, +2 for recliner follow for safety. Pt in KI for increased safety/stability, kept ambulation distance short. No overt LOB noted. ? ?Stairs ?  ?  ?  ?  ?  ? ?Wheelchair Mobility ?  ? ?Modified Rankin (Stroke Patients Only) ?  ? ?  ? ?Balance Overall balance assessment: Needs assistance ?Sitting-balance support: Feet supported, No upper extremity supported ?Sitting balance-Leahy Scale: Good ?  ?  ?Standing balance support: Reliant on assistive device for balance, During functional activity, Bilateral upper extremity supported ?Standing balance-Leahy Scale: Poor ?  ?  ?  ?  ?  ?  ?  ?  ?  ?  ?  ?  ?   ? ? ? ?  Pertinent Vitals/Pain Pain Assessment ?Pain Assessment: 0-10 ?Pain Score: 4  ?Pain Location: left knee ?Pain Descriptors / Indicators: Operative site guarding, Discomfort ?Pain  Intervention(s): Limited activity within patient's tolerance, Monitored during session, Repositioned, Ice applied  ? ? ?Home Living Family/patient expects to be discharged to:: Private residence ?Living Arrangements: Spouse/significant other ?Available Help at Discharge: Family;Available 24 hours/day ?Type of Home: House ?Home Access: Stairs to enter ?Entrance Stairs-Rails: Right;Left ?Entrance Stairs-Number of Steps: 5 ?  ?Home Layout: Two level;Able to live on main level with bedroom/bathroom;Full bath on main level ?Home Equipment: BSC/3in1;Cane - single point;Rolling Walker (2 wheels) ?   ?  ?Prior Function Prior Level of Function : Independent/Modified Independent ?  ?  ?  ?  ?  ?  ?Mobility Comments: ind ?ADLs Comments: ind ?  ? ? ?Hand Dominance  ? Dominant Hand: Right ? ?  ?Extremity/Trunk Assessment  ? Upper Extremity Assessment ?Upper Extremity Assessment: Overall WFL for tasks assessed ?  ? ?Lower Extremity Assessment ?Lower Extremity Assessment: RLE deficits/detail;LLE deficits/detail ?RLE Deficits / Details: MMT ank pf/df 5/5 ?RLE Sensation: WNL ?LLE Deficits / Details: MMT ank pf/df 5/5, pt unable to complete SLR. ?LLE Sensation: WNL ?  ? ?Cervical / Trunk Assessment ?Cervical / Trunk Assessment: Back Surgery  ?Communication  ? Communication: No difficulties  ?Cognition Arousal/Alertness: Awake/alert ?Behavior During Therapy: [facility] for tasks assessed/performed ?Overall Cognitive Status: Within Functional Limits for tasks assessed ?  ?  ?  ?  ?  ?  ?  ?  ?  ?  ?  ?  ?  ?  ?  ?  ?  ?  ?  ? ?  ?General Comments General comments (skin integrity, edema, etc.): Husband Gery Pray present for session ? ?  ?Exercises Total Joint Exercises ?Ankle Circles/Pumps: AROM, 20 reps, Both, Seated  ? ?Assessment/Plan  ?  ?PT Assessment Patient needs continued PT services  ?PT Problem List Decreased strength;Decreased range of motion;Decreased activity tolerance;Decreased balance;Decreased mobility;Decreased knowledge of use of  DME;Pain ? ?   ?  ?PT Treatment Interventions Therapeutic exercise;DME instruction;Gait training;Stair training;Functional mobility training;Therapeutic activities;Balance training;Neuromuscular re-education;Patient/family education   ? ?PT Goals (Current goals can be found in the Care Plan section)  ?Acute Rehab PT Goals ?Patient Stated Goal: Get back to normal ?PT Goal Formulation: With patient ?Time For Goal Achievement: 07/29/21 ?Potential to Achieve Goals: Good ? ?  ?Frequency 7X/week ?  ? ? ?Co-evaluation   ?  ?  ?  ?  ? ? ?  ?AM-PAC PT "6 Clicks" Mobility  ?Outcome Measure Help needed turning from your back to your side while in a flat bed without using bedrails?: A Little ?Help needed moving from lying on your back to sitting on the side of a flat bed without using bedrails?: A Little ?Help needed moving to and from a bed to a chair (including a wheelchair)?: A Little ?Help needed standing up from a chair using your arms (e.g., wheelchair or bedside chair)?: A Little ?Help needed to walk in hospital room?: A Little ?Help needed climbing 3-5 steps with a railing? : A Little ?6 Click Score: 18 ? ?  ?End of Session Equipment Utilized During Treatment: Gait belt ?Activity Tolerance: Patient tolerated treatment well;No increased pain ?Patient left: in chair;with call bell/phone within reach;with chair alarm set;with family/visitor present ?Nurse Communication: Mobility status ?PT Visit Diagnosis: Difficulty in walking, not elsewhere classified (R26.2) ?  ? ?Time: 2426-8341 ?PT Time Calculation (min) (ACUTE ONLY): 18 min ? ? ?Charges:  PT Evaluation ?$PT Eval Low Complexity: 1 Low ?  ?  ?   ? ? ?Jamesetta Geralds, PT, DPT ?[facility] ?Office: [phone] ?Pager: [phone] ? ?Jamesetta Geralds ?07/22/2021, 6:03 PM ? ?

## 2021-07-22 NOTE — Interval H&P Note (Signed)
History and Physical Interval Note: The patient understands that she is here today for a left knee replacement to treat her left knee osteoarthritis.  There has been no acute or interval change in her medical status.  Please see recent H&P.  The risks and benefits of surgery have been explained in detail and informed consent is obtained. ? ?07/22/2021 ?10:03 AM ? ?Maria Becker  has presented today for surgery, with the diagnosis of OSTEOARTHRITIS / [place].  The various methods of treatment have been discussed with the patient and family. After consideration of risks, benefits and other options for treatment, the patient has consented to  Procedure(s) with comments: ?LEFT TOTAL KNEE ARTHROPLASTY (Left) - RFNA as a surgical intervention.  The patient's history has been reviewed, patient examined, no change in status, stable for surgery.  I have reviewed the patient's chart and labs.  Questions were answered to the patient's satisfaction.   ? ? ?[name] ? ? ?

## 2021-07-22 NOTE — Op Note (Signed)
NAME: Maria Becker, Maria C. ?MEDICAL RECORD NO: [id number] ?ACCOUNT NO: [id number] ?DATE OF BIRTH: November 19, 1960 ?FACILITY: [facility] ?LOCATION: [id number] ?PHYSICIAN: Lind Guest. Ninfa Linden, MD ? ?Operative Report  ? ?DATE OF PROCEDURE: 07/22/2021 ? ?PREOPERATIVE DIAGNOSIS:  Primary osteoarthritis and degenerative joint disease, left knee, with valgus malalignment. ? ?POSTOPERATIVE DIAGNOSIS:  Primary osteoarthritis and degenerative joint disease, left knee, with valgus malalignment. ? ?PROCEDURE:  Left total knee arthroplasty. ? ?IMPLANTS:  Biomet/Zimmer Persona cemented knee system with size 6 left narrow CR femur, size C left tibial tray, 14 mm thickness, left medial congruent fixed bearing polyethylene insert, size 29 mm patellar button. ? ?SURGEON:  Lind Guest. Ninfa Linden, MD ? ?ASSISTANTSandy Salaam, RNFA. ? ?ANESTHESIA:  ?1.  Left lower extremity adductor canal block. ?2.  Spinal. ?3.  Local with 0.25% Marcaine with epinephrine around the arthrotomy. ? ?TOURNIQUET TIME:  Under 1 hour. ? ?ANTIBIOTICS:  2 g IV Ancef. ? ?BLOOD LOSS:  Less than 100 mL ?Marland Kitchen  ?COMPLICATIONS:  None. ? ?INDICATIONS:  The patient is a 61 year old female well known to me.  She has significant arthritis involving her left knee with valgus malalignment.  She has tried and failed all forms of conservative treatment.  At this point, she does wish to proceed  ?with total knee arthroplasty.  Her pain is daily with that left knee and is detrimentally affecting her mobility, her quality of life and her activities of daily living.  We did talk about the risk of acute blood loss anemia, nerve or vessel injury,  ?fracture, infection, DVT,  and implant failure.  We talked about our goals being decrease the pain, improve mobility and overall improve quality of life. ? ?DESCRIPTION OF PROCEDURE:  After informed consent was obtained, appropriate left knee was marked.  She was brought to the operating room after an adductor canal block was obtained in the holding room in  the left lower extremity in the operating room.   ?She was sat up on the operating table where spinal anesthesia was obtained.  She was laid in supine position on the operating table.  Foley catheter was placed and a nonsterile tourniquet was placed around her upper left thigh.  The left thigh, knee,  ?leg, ankle and foot were prepped and draped with DuraPrep and sterile drapes including a sterile stockinette.  A timeout was called and she was identified as correct patient, correct left knee.  We then used Esmarch to wrap that leg and tourniquet was  ?inflated to 300 mm of pressure.  We then made a direct midline incision over the patella with the knee in extended position.  We dissected down the knee joint, carried out a medial parapatellar arthrotomy, finding a moderate joint effusion.  With the  ?knee in a flexed position, we removed remnants of the ACL, medial and lateral meniscus as well as osteophytes in all three compartments.  We used our extramedullary cutting guide for making our tibia cut, correcting for varus and valgus and a 3-degree  ?slope.  We made this to take 9 to 10 mm off the high side.  After I made this cut, I did back it down 2 more millimeters.  I then went to the femur and used intramedullary guide for making our distal femoral cut for left knee at 5 degrees externally  ?rotated for a 10 mm distal femoral cut.  We made that cut without difficulty, and brought the knee back down to full extension with a 10 mm extension block and achieved full extension.  We then went back to the femur and put our femoral sizing guide  ?based off the epicondylar axis.  Based off of this, we chose a size 6 femur.  We put a 4-in-1 cutting block for a size 6 femur, made our anterior and posterior cuts, followed by our chamfer cuts.  We then went back to the tibia and chose a size C left  ?tibial tray for coverage, setting the rotation of the tibial tubercle and the femur.  We made this with the keel punch and  drill hole without difficulty.  With a size C left tibia, we trialed a size 6 CR left femur.  We placed a 10 mm fixed bearing  ?medial congruent polyethylene trial for left knee.  We went up to a 14 trial.  I was pleased with the stability with the 14 trial and held this brought her out of her valgus.  We then made our patellar cut and drilled three holes for size 29 patella.   ?With all trial instrumentations in there, took the knee through several cycles of motion.  I was pleased with range of motion and stability.  We then removed all trial instruments from the knee, we irrigated the knee with normal saline solution using  ?pulsatile lavage.  I then placed the Marcaine with epinephrine around the arthrotomy.  With the knee in a flexed position, we mixed our cement and then cemented our real Biomet/Zimmer Persona knee with the size C left tibial tray followed by the size 6  ?narrow left CR femur.  We removed excess cement debris from the knee and placed our 14 mm thickness medial congruent fixed bearing polyethylene insert in the left knee.  We then brought the knee back down to full extension and cemented our patellar  ?button.  We held the knee compressed and fully extended to allow the cement to harden.  Once the cement had hardened, we let the tourniquet down.  Hemostasis was obtained with electrocautery.  We then closed the arthrotomy with interrupted #1 Vicryl  ?suture followed by 0 Vicryl to close the deep tissue and 2-0 Vicryl to close subcutaneous tissue.  The skin was closed with staples.  A well-padded sterile dressing was applied.  She was taken to recovery room in stable condition with all final counts  ?being correct.  No complications noted. ? ? ?MUK ?D: 07/22/2021 12:48:24 pm T: 07/22/2021 10:50:00 pm  ?JOB: [id number]  ?

## 2021-07-23 DIAGNOSIS — Z7982 Long term (current) use of aspirin: Secondary | ICD-10-CM | POA: Diagnosis not present

## 2021-07-23 DIAGNOSIS — M1712 Unilateral primary osteoarthritis, left knee: Secondary | ICD-10-CM | POA: Diagnosis not present

## 2021-07-23 DIAGNOSIS — I509 Heart failure, unspecified: Secondary | ICD-10-CM | POA: Diagnosis not present

## 2021-07-23 DIAGNOSIS — Z79899 Other long term (current) drug therapy: Secondary | ICD-10-CM | POA: Diagnosis not present

## 2021-07-23 DIAGNOSIS — E039 Hypothyroidism, unspecified: Secondary | ICD-10-CM | POA: Diagnosis not present

## 2021-07-23 DIAGNOSIS — I2510 Atherosclerotic heart disease of native coronary artery without angina pectoris: Secondary | ICD-10-CM | POA: Diagnosis not present

## 2021-07-23 DIAGNOSIS — I110 Hypertensive heart disease with heart failure: Secondary | ICD-10-CM | POA: Diagnosis not present

## 2021-07-23 LAB — CBC
HCT: 38.3 % (ref 36.0–46.0)
Hemoglobin: 11.8 g/dL — ABNORMAL LOW (ref 12.0–15.0)
MCH: 28.8 pg (ref 26.0–34.0)
MCHC: 30.8 g/dL (ref 30.0–36.0)
MCV: 93.4 fL (ref 80.0–100.0)
Platelets: 190 10*3/uL (ref 150–400)
RBC: 4.1 MIL/uL (ref 3.87–5.11)
RDW: 13.1 % (ref 11.5–15.5)
WBC: 11.3 10*3/uL — ABNORMAL HIGH (ref 4.0–10.5)
nRBC: 0 % (ref 0.0–0.2)

## 2021-07-23 LAB — BASIC METABOLIC PANEL
Anion gap: 7 (ref 5–15)
BUN: 15 mg/dL (ref 8–23)
CO2: 27 mmol/L (ref 22–32)
Calcium: 8.9 mg/dL (ref 8.9–10.3)
Chloride: 106 mmol/L (ref 98–111)
Creatinine, Ser: 0.77 mg/dL (ref 0.44–1.00)
GFR, Estimated: >60 mL/min (ref >60)
Glucose, Bld: 138 mg/dL — ABNORMAL HIGH (ref 70–99)
Potassium: 4.4 mmol/L (ref 3.5–5.1)
Sodium: 140 mmol/L (ref 135–145)

## 2021-07-23 MED ORDER — HYDROCODONE-ACETAMINOPHEN 5-325 MG PO TABS
1.0000 | ORAL_TABLET | ORAL | Status: DC | PRN
  Administered 2021-07-23 – 2021-07-24 (×5): 2 via ORAL
  Filled 2021-07-23 (×5): qty 2

## 2021-07-23 MED ORDER — POLYETHYLENE GLYCOL 3350 17 G PO PACK
17.0000 g | PACK | Freq: Every day | ORAL | Status: DC
  Administered 2021-07-23: 17 g via ORAL
  Filled 2021-07-23 (×2): qty 1

## 2021-07-23 MED ORDER — TIZANIDINE HCL 4 MG PO TABS: 4.0000 mg | ORAL_TABLET | Freq: Four times a day (QID) | ORAL | Status: DC | PRN

## 2021-07-23 MED ORDER — SENNA 8.6 MG PO TABS: 1.0000 | ORAL_TABLET | Freq: Every evening | ORAL | Status: DC | PRN

## 2021-07-23 MED ORDER — ONDANSETRON 4 MG PO TBDP
4.0000 mg | ORAL_TABLET | Freq: Three times a day (TID) | ORAL | 0 refills | Status: DC | PRN
Start: 2021-07-23 — End: 2023-05-17

## 2021-07-23 MED ORDER — ASPIRIN 81 MG PO CHEW: 81.0000 mg | CHEWABLE_TABLET | Freq: Two times a day (BID) | ORAL | 0 refills | Status: DC

## 2021-07-23 MED ORDER — HYDROCODONE-ACETAMINOPHEN 7.5-325 MG PO TABS: 1.0000 | ORAL_TABLET | ORAL | 0 refills | Status: DC | PRN

## 2021-07-23 MED ORDER — TIZANIDINE HCL 4 MG PO TABS: 4.0000 mg | ORAL_TABLET | Freq: Four times a day (QID) | ORAL | 0 refills | Status: DC | PRN

## 2021-07-23 NOTE — Progress Notes (Signed)
Physical Therapy Treatment ?Patient Details ?Name: Maria Becker ?MRN: [id number] ?DOB: 1960/07/03 ?Today's Date: 07/23/2021 ? ? ?History of Present Illness Pt is a 61yo female presenting s/p L-TKA on 07/22/21. 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 ? ?  ?PT Comments  ? ? Pt limited by N/V, contacted RN for additional nausea meds. Reviewed TKA HEP which pt tolerated well however deferred amb d/t pt having just had episode of N/V and not feeling well. Will see again this pm.  ?Recommendations for follow up therapy are one component of a multi-disciplinary discharge planning process, led by the attending physician.  Recommendations may be updated based on patient status, additional functional criteria and insurance authorization. ? ?Follow Up Recommendations ? Follow physician's recommendations for discharge plan and follow up therapies ?  ?  ?Assistance Recommended at Discharge Intermittent Supervision/Assistance  ?Patient can return home with the following A little help with walking and/or transfers;A little help with bathing/dressing/bathroom;Assistance with cooking/housework;Assist for transportation;Help with stairs or ramp for entrance ?  ?Equipment Recommendations ? None recommended by PT  ?  ?Recommendations for Other Services   ? ? ?  ?Precautions / Restrictions Precautions ?Precautions: Fall;Knee ?Precaution Comments: reviewed no pillow under knee, knee of bed not locked and pt reports havign been in incr flexed position prior to OOB ?Required Braces or Orthoses: Knee Immobilizer - Left ?Knee Immobilizer - Left: Discontinue once straight leg raise with < 10 degree lag ?Restrictions ?Weight Bearing Restrictions: No ?LLE Weight Bearing: Weight bearing as tolerated  ?  ? ?Mobility ? Bed Mobility ?  ?  ?  ?  ?  ?  ?  ?General bed mobility comments: in recliner on arrival ?  ? ?Transfers ?  ?  ?  ?  ?  ?  ?  ?  ?  ?General transfer comment:  (deferred d/t N/V) ?   ? ?Ambulation/Gait ?  ?  ?  ?  ?  ?  ?  ?  ? ? ?Stairs ?  ?  ?  ?  ?  ? ? ?Wheelchair Mobility ?  ? ?Modified Rankin (Stroke Patients Only) ?  ? ? ?  ?Balance   ?  ?  ?  ?  ?  ?  ?  ?  ?  ?  ?  ?  ?  ?  ?  ?  ?  ?  ?  ? ?  ?Cognition Arousal/Alertness: Awake/alert ?Behavior During Therapy: [facility] for tasks assessed/performed ?Overall Cognitive Status: Within Functional Limits for tasks assessed ?  ?  ?  ?  ?  ?  ?  ?  ?  ?  ?  ?  ?  ?  ?  ?  ?  ?  ?  ? ?  ?Exercises Total Joint Exercises ?Ankle Circles/Pumps: AROM, Both, 10 reps ?Quad Sets: AROM, Both, 10 reps ?Heel Slides: AAROM, Left, 10 reps ?Hip ABduction/ADduction: AAROM, Left, 10 reps ?Straight Leg Raises: AAROM, 10 reps, Left ? ?  ?General Comments   ?  ?  ? ?Pertinent Vitals/Pain Pain Assessment ?Pain Assessment: 0-10 ?Pain Score: 6  ?Pain Location: left knee ?Pain Descriptors / Indicators: Operative site guarding, Discomfort ?Pain Intervention(s): Limited activity within patient's tolerance, Monitored during session, Premedicated before session, Repositioned, Ice applied  ? ? ?Home Living   ?  ?  ?  ?  ?  ?  ?  ?  ?  ?   ?  ?Prior Function    ?  ?  ?   ? ?  PT Goals (current goals can now be found in the care plan section) Acute Rehab PT Goals ?Patient Stated Goal: Get back to normal ?PT Goal Formulation: With patient ?Time For Goal Achievement: 07/29/21 ?Potential to Achieve Goals: Good ?Progress towards PT goals: Progressing toward goals ? ?  ?Frequency ? ? ? 7X/week ? ? ? ?  ?PT Plan Current plan remains appropriate  ? ? ?Co-evaluation   ?  ?  ?  ?  ? ?  ?AM-PAC PT "6 Clicks" Mobility   ?Outcome Measure ? Help needed turning from your back to your side while in a flat bed without using bedrails?: A Little ?Help needed moving from lying on your back to sitting on the side of a flat bed without using bedrails?: A Little ?Help needed moving to and from a bed to a chair (including a wheelchair)?: A Little ?Help needed standing up from a chair using your arms  (e.g., wheelchair or bedside chair)?: A Little ?Help needed to walk in hospital room?: A Little ?Help needed climbing 3-5 steps with a railing? : A Little ?6 Click Score: 18 ? ?  ?End of Session   ?Activity Tolerance: Patient tolerated treatment well;No increased pain ?Patient left: in chair;with call bell/phone within reach;with chair alarm set;with family/visitor present ?  ?PT Visit Diagnosis: Difficulty in walking, not elsewhere classified (R26.2) ?  ? ? ?Time: 5170-0174 ?PT Time Calculation (min) (ACUTE ONLY): 25 min ? ?Charges:  $Therapeutic Exercise: 8-22 mins          ?          ? Delice Bison, PT ? ?Acute Rehab Dept [facility]) [phone] ?Pager [phone] ? ?07/23/2021 ? ? ? ?Linton Stolp ?07/23/2021, 10:26 AM ? ?

## 2021-07-23 NOTE — Discharge Instructions (Signed)

## 2021-07-23 NOTE — Progress Notes (Signed)
Physical Therapy Treatment ?Patient Details ?Name: Maria Becker ?MRN: [id number] ?DOB: [birth year] ?Today's Date: 07/23/2021 ? ? ?History of Present Illness Pt is a 61yo female presenting s/p L-TKA on 07/22/21. 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 ? ?  ?PT Comments  ? ? Pt is progressing with mobility. Amb ~ 54' with min/guard assist however with c/o dizziness and continued nausea. Pt will likely be ready for d/c tomorrow if medical issues resolve.   ?Recommendations for follow up therapy are one component of a multi-disciplinary discharge planning process, led by the attending physician.  Recommendations may be updated based on patient status, additional functional criteria and insurance authorization. ? ?Follow Up Recommendations ? Follow physician's recommendations for discharge plan and follow up therapies ?  ?  ?Assistance Recommended at Discharge Intermittent Supervision/Assistance  ?Patient can return home with the following A little help with walking and/or transfers;A little help with bathing/dressing/bathroom;Assistance with cooking/housework;Assist for transportation;Help with stairs or ramp for entrance ?  ?Equipment Recommendations ? None recommended by PT  ?  ?Recommendations for Other Services   ? ? ?  ?Precautions / Restrictions Precautions ?Precautions: Fall;Knee ?Precaution Comments: reviewed no pillow under knee, knee of bed not locked and pt reports havign been in incr flexed position prior to OOB ?Required Braces or Orthoses: Knee Immobilizer - Left ?Knee Immobilizer - Left: Discontinue once straight leg raise with < 10 degree lag ?Restrictions ?Weight Bearing Restrictions: No ?LLE Weight Bearing: Weight bearing as tolerated  ?  ? ?Mobility ? Bed Mobility ?Overal bed mobility: Needs Assistance ?Bed Mobility: Supine to Sit, Sit to Supine ?  ?  ?Supine to sit: Min assist ?Sit to supine: Min assist ?  ?General bed mobility comments: assist with LLE on adn off  bed d/t pain ?  ? ?Transfers ?Overall transfer level: Needs assistance ?Equipment used: Rolling walker (2 wheels) ?Transfers: Sit to/from Stand ?Sit to Stand: From elevated surface, Min guard ?  ?  ?  ?  ?  ?General transfer comment: Min guard for safety only from elevated surface, no physical assist required. (deferred d/t N/V) ?  ? ?Ambulation/Gait ?Ambulation/Gait assistance: Min guard ?Gait Distance (Feet): 50 Feet ?Assistive device: Rolling walker (2 wheels) ?Gait Pattern/deviations: Step-to pattern ?  ?  ?  ?General Gait Details: min/guard for safety, cues initially for sequence and step to gait for pain control ? ? ?Stairs ?  ?  ?  ?  ?  ? ? ?Wheelchair Mobility ?  ? ?Modified Rankin (Stroke Patients Only) ?  ? ? ?  ?Balance   ?  ?  ?  ?  ?  ?  ?  ?  ?  ?  ?  ?  ?  ?  ?  ?  ?  ?  ?  ? ?  ?Cognition Arousal/Alertness: Awake/alert ?Behavior During Therapy: [facility] for tasks assessed/performed ?Overall Cognitive Status: Within Functional Limits for tasks assessed ?  ?  ?  ?  ?  ?  ?  ?  ?  ?  ?  ?  ?  ?  ?  ?  ?  ?  ?  ? ?  ?Exercises Total Joint Exercises ?Ankle Circles/Pumps: AROM, Both, 10 reps ?Quad Sets: AROM, Both, 10 reps ?Heel Slides: AAROM, Left, 10 reps ?Hip ABduction/ADduction: AAROM, Left, 10 reps ?Straight Leg Raises: AAROM, 10 reps, Left ? ?  ?General Comments   ?  ?  ? ?Pertinent Vitals/Pain Pain Assessment ?Pain Assessment: 0-10 ?Pain Score:  5  ?Pain Location: left knee ?Pain Descriptors / Indicators: Operative site guarding, Discomfort ?Pain Intervention(s): Limited activity within patient's tolerance, Monitored during session, Premedicated before session  ? ? ?Home Living   ?  ?  ?  ?  ?  ?  ?  ?  ?  ?   ?  ?Prior Function    ?  ?  ?   ? ?PT Goals (current goals can now be found in the care plan section) Acute Rehab PT Goals ?Patient Stated Goal: Get back to normal ?PT Goal Formulation: With patient ?Time For Goal Achievement: 07/29/21 ?Potential to Achieve Goals: Good ?Progress towards PT goals:  Progressing toward goals ? ?  ?Frequency ? ? ? 7X/week ? ? ? ?  ?PT Plan Current plan remains appropriate  ? ? ?Co-evaluation   ?  ?  ?  ?  ? ?  ?AM-PAC PT "6 Clicks" Mobility   ?Outcome Measure ? Help needed turning from your back to your side while in a flat bed without using bedrails?: A Little ?Help needed moving from lying on your back to sitting on the side of a flat bed without using bedrails?: A Little ?Help needed moving to and from a bed to a chair (including a wheelchair)?: A Little ?Help needed standing up from a chair using your arms (e.g., wheelchair or bedside chair)?: A Little ?Help needed to walk in hospital room?: A Little ?Help needed climbing 3-5 steps with a railing? : A Little ?6 Click Score: 18 ? ?  ?End of Session Equipment Utilized During Treatment: Gait belt;Left knee immobilizer ?Activity Tolerance: Patient tolerated treatment well;No increased pain ?Patient left: with family/visitor present;in bed;with call bell/phone within reach;with bed alarm set ?  ?PT Visit Diagnosis: Difficulty in walking, not elsewhere classified (R26.2) ?  ? ? ?Time: [id number] ?PT Time Calculation (min) (ACUTE ONLY): 27 min ? ?Charges:  $Gait Training: 8-22 mins ?$Therapeutic Activity: 8-22 mins          ?          ? Baxter Flattery, PT ? ?Acute Rehab Dept [facility]) [phone] ?Pager [phone] ? ?07/23/2021 ? ? ? ?Kurtiss Wence ?07/23/2021, 3:58 PM ? ?

## 2021-07-23 NOTE — Progress Notes (Signed)
Subjective: ?1 Day Post-Op Procedure(s) (LRB): ?LEFT TOTAL KNEE ARTHROPLASTY (Left) ?Patient reports pain as moderate.  Significant nausea.  Does not tolerate pain meds well, but needs them. ? ?Objective: ?Vital signs in last 24 hours: ?Temp:  [97.4 ?F (36.3 ?C)-97.9 ?F (36.6 ?C)] 97.9 ?F (36.6 ?C) (05/13 2122) ?Pulse Rate:  [69-92] 82 (05/13 0622) ?Resp:  [15-20] 16 (05/13 0622) ?BP: (99-135)/(45-87) 117/57 (05/13 0622) ?SpO2:  [95 %-100 %] 100 % (05/13 0622) ? ?Intake/Output from previous day: ?05/12 0701 - 05/13 0700 ?In: 2216.7 [P.O.:960; I.V.:1106.7; IV Piggyback:150] ?Out: 1125 [Urine:1100; Blood:25] ?Intake/Output this shift: ?No intake/output data recorded. ? ?Recent Labs  ?  07/23/21 ?0316  ?HGB 11.8*  ? ?Recent Labs  ?  07/23/21 ?0316  ?WBC 11.3*  ?RBC 4.10  ?HCT 38.3  ?PLT 190  ? ?Recent Labs  ?  07/23/21 ?0316  ?NA 140  ?K 4.4  ?CL 106  ?CO2 27  ?BUN 15  ?CREATININE 0.77  ?GLUCOSE 138*  ?CALCIUM 8.9  ? ?No results for input(s): LABPT, INR in the last 72 hours. ? ?Sensation intact distally ?Intact pulses distally ?Dorsiflexion/Plantar flexion intact ?Incision: dressing C/D/I ?No cellulitis present ?Compartment soft ? ? ?Assessment/Plan: ?1 Day Post-Op Procedure(s) (LRB): ?LEFT TOTAL KNEE ARTHROPLASTY (Left) ?Up with therapy ?Plan for discharge tomorrow ?Discharge home with home health ? ? ? ? ? ?Kathryne Hitch ?07/23/2021, 11:11 AM ? ?

## 2021-07-23 NOTE — Progress Notes (Signed)
Notified Dr. Huel Cote, MD that there is difficulty weaning patient off of 02. Lung sounds are clear and diminished in the bases. Patient said she has history of shortness of breath at home and CHF. Off and on throughout the day, 02 sats on room air have ranged between 85 and 92%. Put patient back on continuous pulse ox.  ?Verbal order from Dr. Huel Cote, MD: Put in a respiratory consult. Patient also said that IV Dilaudid helped and asked for another dose of IV Dilaudid. Gave oral Dilaudid and educated patient on oral pain regimen and being able to manage pain with oral pain meds at home. Will continue to monitor patient.  ?

## 2021-07-23 NOTE — Plan of Care (Signed)
  Problem: Education: Goal: Knowledge of General Education information will improve Description Including pain rating scale, medication(s)/side effects and non-pharmacologic comfort measures Outcome: Progressing   

## 2021-07-24 DIAGNOSIS — M1712 Unilateral primary osteoarthritis, left knee: Secondary | ICD-10-CM | POA: Diagnosis not present

## 2021-07-24 DIAGNOSIS — E039 Hypothyroidism, unspecified: Secondary | ICD-10-CM | POA: Diagnosis not present

## 2021-07-24 DIAGNOSIS — I2510 Atherosclerotic heart disease of native coronary artery without angina pectoris: Secondary | ICD-10-CM | POA: Diagnosis not present

## 2021-07-24 DIAGNOSIS — Z7982 Long term (current) use of aspirin: Secondary | ICD-10-CM | POA: Diagnosis not present

## 2021-07-24 DIAGNOSIS — I110 Hypertensive heart disease with heart failure: Secondary | ICD-10-CM | POA: Diagnosis not present

## 2021-07-24 DIAGNOSIS — Z79899 Other long term (current) drug therapy: Secondary | ICD-10-CM | POA: Diagnosis not present

## 2021-07-24 DIAGNOSIS — I509 Heart failure, unspecified: Secondary | ICD-10-CM | POA: Diagnosis not present

## 2021-07-24 NOTE — Plan of Care (Signed)
  Problem: Pain Management: Goal: Pain level will decrease with appropriate interventions Outcome: Progressing   Problem: Coping: Goal: Level of anxiety will decrease Outcome: Progressing   

## 2021-07-24 NOTE — Discharge Summary (Signed)
? ? ?Patient ID: ?Maria LoganDonna C Becker ?MRN: [id number] ?DOB/AGE: 1960/05/19 61 y.o. ? ?Admit date: 07/22/2021 ?Discharge date: 07/24/2021 ? ?Admission Diagnoses:  ?Unilateral primary osteoarthritis, left knee ? ?Discharge Diagnoses:  ?Principal Problem: ?  Unilateral primary osteoarthritis, left knee ?Active Problems: ?  Status post total left knee replacement ? ? ?Past Medical History:  ?Diagnosis Date  ? Arthritis   ? 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 CAD (coronary artery disease)   ? a. cath 07/2015: 30-50% mid-LAD stenosis but otherwise normal cors  ? CHF (congestive heart failure) (HCC)   ? Dyspnea   ? SOME SOB WITH EXERTION  ? Family history of adverse reaction to anesthesia   ? MOTHER SLOW TO WAKE UP  ? Fibromyalgia   ? GERD (gastroesophageal reflux disease)   ? Headache   ? History of kidney stones   ? Hyperlipidemia   ? a. not currently on statin therapy  ? Hypothyroidism   ? Insomnia   ? Pneumonia   ? HX OF AS A BABY  ? ? ?Surgeries: Procedure(s): ?LEFT TOTAL KNEE ARTHROPLASTY on 07/22/2021 ?  ?Consultants (if any):  ? ?Discharged Condition: Improved ? ?Hospital Course: Maria LoganDonna C Peachey is an 61 y.o. female who was admitted 07/22/2021 with a diagnosis of Unilateral primary osteoarthritis, left knee and went to the operating room on 07/22/2021 and underwent the above named procedures.   ? ?She was given perioperative antibiotics:  ?Anti-infectives (From admission, onward)  ? ? Start     Dose/Rate Route Frequency Ordered Stop  ? 07/22/21 1800  ceFAZolin (ANCEF) IVPB 1 g/50 mL premix       ? 1 g ?100 mL/hr over 30 Minutes Intravenous Every 6 hours 07/22/21 1437 07/23/21 1243  ? 07/22/21 0845  ceFAZolin (ANCEF) IVPB 2g/100 mL premix       ? 2 g ?200 mL/hr over 30 Minutes Intravenous On call to O.R. 07/22/21 [id number] 07/22/21 1111  ? ?  ?. ? ?She was given sequential compression devices, early ambulation, and appropriate chemoprophylaxis for DVT prophylaxis. ? ?She benefited maximally from the hospital stay and there  were no complications.   ? ?Recent vital signs:  ?Vitals:  ? 07/24/21 0113 07/24/21 0410  ?BP: 122/82 (!) 141/67  ?Pulse: 93 91  ?Resp: 16 16  ?Temp: 98.9 ?F (37.2 ?C) 98.4 ?F (36.9 ?C)  ?SpO2: 100% 95%  ? ? ?Recent laboratory studies:  ?Lab Results  ?Component Value Date  ? HGB 11.8 (L) 07/23/2021  ? HGB 14.4 07/15/2021  ? HGB 12.2 04/15/2021  ? ?Lab Results  ?Component Value Date  ? WBC 11.3 (H) 07/23/2021  ? PLT 190 07/23/2021  ? ?Lab Results  ?Component Value Date  ? INR 1.09 07/20/2015  ? ?Lab Results  ?Component Value Date  ? NA 140 07/23/2021  ? K 4.4 07/23/2021  ? CL 106 07/23/2021  ? CO2 27 07/23/2021  ? BUN 15 07/23/2021  ? CREATININE 0.77 07/23/2021  ? GLUCOSE 138 (H) 07/23/2021  ? ? ?Discharge Medications:   ?Allergies as of 07/24/2021   ? ?   Reactions  ? Other Other (See Comments)  ? Dairy cow milk/ ?constipation  ? ?  ? ?  ?Medication List  ?  ? ?STOP taking these medications   ? ?aspirin EC 81 MG tablet ?Replaced by: aspirin 81 MG chewable tablet ?  ? ?  ? ?TAKE these medications   ? ?aspirin 81 MG chewable tablet ?Chew 1 tablet (81 mg total) by mouth 2 (two) times  daily. ?Replaces: aspirin EC 81 MG tablet ?  ?b complex vitamins tablet ?Take 1 tablet by mouth 2 (two) times a week. ?  ?FISH OIL PO ?Take 820 mg by mouth 2 (two) times daily. ?  ?furosemide 40 MG tablet ?Commonly known as: LASIX ?Take 1 tablet (40 mg total) by mouth daily. ?What changed: additional instructions ?  ?HYDROcodone-acetaminophen 7.5-325 MG tablet ?Commonly known as: Norco ?Take 1 tablet by mouth every 4 (four) hours as needed for moderate pain. ?  ?liothyronine 5 MCG tablet ?Commonly known as: CYTOMEL ?Take 10 mcg by mouth daily. ?  ?MAGNESIUM PO ?Take 250 mg by mouth at bedtime. ?  ?NON FORMULARY ?  ?Nurtec 75 MG Tbdp ?Generic drug: Rimegepant Sulfate ?PRN for headaches ?What changed:  ?how much to take ?how to take this ?when to take this ?reasons to take this ?additional instructions ?  ?ondansetron 4 MG disintegrating  tablet ?Commonly known as: ZOFRAN-ODT ?Take 1 tablet (4 mg total) by mouth every 8 (eight) hours as needed for nausea or vomiting. ?  ?OVER THE COUNTER MEDICATION ?Take 1 Scoop by mouth 2 (two) times a week. buffered ascorbic acid powder with water ?  ?potassium chloride SA 20 MEQ tablet ?Commonly known as: KLOR-CON M ?Take 1 tablet (20 mEq total) by mouth daily. ?What changed: when to take this ?  ?PreserVision AREDS Caps ?Take 1 capsule by mouth daily. ?  ?rosuvastatin 10 MG tablet ?Commonly known as: CRESTOR ?Take 1 tablet (10 mg total) by mouth daily. ?What changed: when to take this ?  ?tamsulosin 0.4 MG Caps capsule ?Commonly known as: FLOMAX ?Take 0.4 mg by mouth daily as needed (Kidnsy stone). ?  ?tiZANidine 4 MG tablet ?Commonly known as: Zanaflex ?Take 1 tablet (4 mg total) by mouth every 6 (six) hours as needed for muscle spasms. ?  ?vitamin B-12 1000 MCG tablet ?Commonly known as: CYANOCOBALAMIN ?Take 1,000 mcg by mouth daily. ?  ?Vitamin D (Ergocalciferol) 1.25 MG (50000 UNIT) Caps capsule ?Commonly known as: DRISDOL ?Take 50,000 Units by mouth every Monday. ?  ?VITAMIN E PO ?Take 50 Units by mouth 2 (two) times a week. ?  ? ?  ? ?  ?  ? ? ?  ?Durable Medical Equipment  ?(From admission, onward)  ?  ? ? ?  ? ?  Start     Ordered  ? 07/22/21 1438  DME 3 n 1  Once       ? 07/22/21 1437  ? 07/22/21 1438  DME Walker rolling  Once       ?Question Answer Comment  ?Walker: With 5 Inch Wheels   ?Patient needs a walker to treat with the following condition Status post total left knee replacement   ?  ? 07/22/21 1437  ? ?  ?  ? ?  ? ? ?Diagnostic Studies: DG Knee Left Port ? ?Result Date: 07/22/2021 ?CLINICAL DATA:  Status post knee arthroplasty. EXAM: PORTABLE LEFT KNEE - 1-2 VIEW COMPARISON:  June 13, 2021 FINDINGS: Postsurgical changes of 3 component total knee arthroplasty without evidence of acute complication. IMPRESSION: Status post left total knee arthroplasty without evidence of acute complication.  Electronically Signed   By: Maudry Mayhew M.D.   On: 07/22/2021 15:26   ? ?Disposition: Discharge disposition: 01-Home or Self Care ? ? ? ? ? ? ? ? ? Follow-up Information   ? ? Kathryne Hitch, MD Follow up in 2 week(s).   ?Specialty: Orthopedic Surgery ?Contact information: ?[address] ?[place]  [place] [address] ?[phone] ? ? ?  ?  ? ?  ?  ? ?  ? ? ? ?Signed: ?Allyn Bertoni ?07/24/2021, 8:22 AM  ?

## 2021-07-24 NOTE — Plan of Care (Signed)
  Problem: Education: Goal: Knowledge of General Education information will improve Description Including pain rating scale, medication(s)/side effects and non-pharmacologic comfort measures Outcome: Progressing   Problem: Health Behavior/Discharge Planning: Goal: Ability to manage health-related needs will improve Outcome: Progressing   

## 2021-07-24 NOTE — Progress Notes (Signed)
The patient is alert and oriented and has been seen by her physician. Patient's pain was much improved from yesterday. She stated that of all the pain medications she has had, Norco has worked the best. The orders for discharge were written. IV has been removed. Aquacel dressing is clean, dry, and intact. Went over discharge instructions with patient and family. She is being discharged via wheelchair with all of her belongings.   ?

## 2021-07-24 NOTE — TOC Transition Note (Signed)
Transition of Care (TOC) - CM/SW Discharge Note ? ? ?Patient Details  ?Name: Maria Becker ?MRN: [id number] ?Date of Birth: Jun 12, 1960 ? ?Transition of Care (TOC) CM/SW Contact:  ?Levaeh Vice, LCSW ?Phone Number: ?07/24/2021, 11:39 AM ? ? ?Clinical Narrative:    ?Met with pt and spouse today and discussed concern that I have been unable to secure any [facility] agency willing to accept pt for HHPT services as they cite either staffing issues or ins network issue.  Have alerted MD and PA as well and have recommended that pt contact MD office to have referral made for OPPT.  Pt and spouse understand.  No DME needs.  No further TOC needs. ? ? ?Final next level of care: Home/Self Care ?Barriers to Discharge: No Home Care Agency will accept this patient ? ? ?Patient Goals and CMS Choice ?Patient states their goals for this hospitalization and ongoing recovery are:: return home ?  ?  ? ?Discharge Placement ?  ?           ?  ?  ?  ?  ? ?Discharge Plan and Services ?  ?  ?           ?DME Arranged: N/A ?DME Agency: NA ?  ?  ?  ?  ?  ?  ?  ?  ? ?Social Determinants of Health (SDOH) Interventions ?  ? ? ?Readmission Risk Interventions ?   ? View : No data to display.  ?  ?  ?  ? ? ? ? ? ?

## 2021-07-24 NOTE — Progress Notes (Signed)
Subjective: ?2 Days Post-Op Procedure(s) (LRB): ?LEFT TOTAL KNEE ARTHROPLASTY (Left) ?Pain and nausea are much improved today.  She has been able to void into the commode.  No issues overnight. ? ?Objective: ?Vital signs in last 24 hours: ?Temp:  [98.3 ?F (36.8 ?C)-99.6 ?F (37.6 ?C)] 98.4 ?F (36.9 ?C) (05/14 0410) ?Pulse Rate:  [88-100] 91 (05/14 0410) ?Resp:  [16-20] 16 (05/14 0410) ?BP: (122-141)/(52-82) 141/67 (05/14 0410) ?SpO2:  [85 %-100 %] 95 % (05/14 0410) ? ?Intake/Output from previous day: ?05/13 0701 - 05/14 0700 ?In: 1222.2 [P.O.:420; I.V.:802.2] ?Out: -  ?Intake/Output this shift: ?No intake/output data recorded. ? ?Recent Labs  ?  07/23/21 ?0316  ?HGB 11.8*  ? ? ?Recent Labs  ?  07/23/21 ?0316  ?WBC 11.3*  ?RBC 4.10  ?HCT 38.3  ?PLT 190  ? ? ?Recent Labs  ?  07/23/21 ?0316  ?NA 140  ?K 4.4  ?CL 106  ?CO2 27  ?BUN 15  ?CREATININE 0.77  ?GLUCOSE 138*  ?CALCIUM 8.9  ? ? ?No results for input(s): LABPT, INR in the last 72 hours. ? ?Sensation intact distally ?Intact pulses distally ?Dorsiflexion/Plantar flexion intact ?Incision: dressing C/D/I ?No cellulitis present ?Compartment soft ? ? ?Assessment/Plan: ?2 Days Post-Op Procedure(s) (LRB): ?LEFT TOTAL KNEE ARTHROPLASTY (Left) ?Plan to continue to work with physical therapy for mobilization.  Plan for discharge home with services today. ? ? ? ? ? ?Arnold Kester ?07/24/2021, 8:19 AM ? ?

## 2021-07-24 NOTE — Progress Notes (Signed)
Physical Therapy Treatment ?Patient Details ?Name: Maria Becker ?MRN: [id number] ?DOB: [birth year] ?Today's Date: 07/24/2021 ? ? ?History of Present Illness Pt is a 61yo female presenting s/p L-TKA on 07/22/21. 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 ? ?  ?PT Comments  ? ? Pt progressing very well today. Meeting goals. Husband present for session. Pt is ready to d/c from PT standpoint with family assist prn.  ?Recommendations for follow up therapy are one component of a multi-disciplinary discharge planning process, led by the attending physician.  Recommendations may be updated based on patient status, additional functional criteria and insurance authorization. ? ?Follow Up Recommendations ? Follow physician's recommendations for discharge plan and follow up therapies ?  ?  ?Assistance Recommended at Discharge Intermittent Supervision/Assistance  ?Patient can return home with the following A little help with walking and/or transfers;A little help with bathing/dressing/bathroom;Assistance with cooking/housework;Assist for transportation;Help with stairs or ramp for entrance ?  ?Equipment Recommendations ? None recommended by PT  ?  ?Recommendations for Other Services   ? ? ?  ?Precautions / Restrictions Precautions ?Precautions: Fall;Knee ?Precaution Comments: reviewed no pillow under knee, knee of bed not locked and pt reports havign been in incr flexed position prior to OOB ?Required Braces or Orthoses: Knee Immobilizer - Left ?Knee Immobilizer - Left: Discontinue once straight leg raise with < 10 degree lag ?Restrictions ?Weight Bearing Restrictions: No ?LLE Weight Bearing: Weight bearing as tolerated  ?  ? ?Mobility ? Bed Mobility ?Overal bed mobility: Needs Assistance ?Bed Mobility: Supine to Sit, Sit to Supine ?  ?  ?Supine to sit: Supervision ?Sit to supine: Supervision ?  ?General bed mobility comments: able to self assist with LLE on and off bed with gait belt ?   ? ?Transfers ?Overall transfer level: Needs assistance ?Equipment used: Rolling walker (2 wheels) ?Transfers: Sit to/from Stand ?Sit to Stand: Supervision ?  ?  ?  ?  ?  ?General transfer comment: cues for hand placement . (deferred d/t N/V) ?  ? ?Ambulation/Gait ?Ambulation/Gait assistance: Supervision ?Gait Distance (Feet): 80 Feet ?Assistive device: Rolling walker (2 wheels) ?Gait Pattern/deviations: Step-to pattern, Decreased stance time - left ?Gait velocity: decreased ?  ?  ?General Gait Details: supervision for safety, cues initially for sequence and step to gait for pain control. steady wth RW, no LOB ? ? ?Stairs ?Stairs: Yes ?Stairs assistance: Min guard ?Stair Management: One rail Left, Step to pattern, Sideways ?Number of Stairs: 2 (x2) ?General stair comments: cues for sequence and technique ? ? ?Wheelchair Mobility ?  ? ?Modified Rankin (Stroke Patients Only) ?  ? ? ?  ?Balance   ?  ?  ?  ?  ?  ?  ?  ?  ?  ?  ?  ?  ?  ?  ?  ?  ?  ?  ?  ? ?  ?Cognition Arousal/Alertness: Awake/alert ?Behavior During Therapy: [facility] for tasks assessed/performed ?Overall Cognitive Status: Within Functional Limits for tasks assessed ?  ?  ?  ?  ?  ?  ?  ?  ?  ?  ?  ?  ?  ?  ?  ?  ?  ?  ?  ? ?  ?Exercises Total Joint Exercises ?Ankle Circles/Pumps: AROM, Both, 10 reps ?Quad Sets: AROM, Both, 10 reps ?Heel Slides: AAROM, Left, 10 reps ?Straight Leg Raises: AAROM, 10 reps, Left ? ?  ?General Comments   ?  ?  ? ?Pertinent Vitals/Pain Pain Assessment ?  Pain Assessment: 0-10 ?Pain Score: 5  ?Pain Location: left knee ?Pain Descriptors / Indicators: Operative site guarding, Discomfort ?Pain Intervention(s): Limited activity within patient's tolerance, Monitored during session, Premedicated before session, Repositioned, Ice applied  ? ? ?Home Living   ?  ?  ?  ?  ?  ?  ?  ?  ?  ?   ?  ?Prior Function    ?  ?  ?   ? ?PT Goals (current goals can now be found in the care plan section) Acute Rehab PT Goals ?Patient Stated Goal: Get back to  normal ?PT Goal Formulation: With patient ?Time For Goal Achievement: 07/29/21 ?Potential to Achieve Goals: Good ?Progress towards PT goals: Progressing toward goals ? ?  ?Frequency ? ? ? 7X/week ? ? ? ?  ?PT Plan Current plan remains appropriate  ? ? ?Co-evaluation   ?  ?  ?  ?  ? ?  ?AM-PAC PT "6 Clicks" Mobility   ?Outcome Measure ? Help needed turning from your back to your side while in a flat bed without using bedrails?: A Little ?Help needed moving from lying on your back to sitting on the side of a flat bed without using bedrails?: None ?Help needed moving to and from a bed to a chair (including a wheelchair)?: A Little ?Help needed standing up from a chair using your arms (e.g., wheelchair or bedside chair)?: A Little ?Help needed to walk in hospital room?: A Little ?Help needed climbing 3-5 steps with a railing? : A Little ?6 Click Score: 19 ? ?  ?End of Session Equipment Utilized During Treatment: Gait belt;Left knee immobilizer ?Activity Tolerance: Patient tolerated treatment well;No increased pain ?Patient left: with family/visitor present;in bed;with call bell/phone within reach;with bed alarm set ?  ?PT Visit Diagnosis: Difficulty in walking, not elsewhere classified (R26.2) ?  ? ? ?Time: 1026-1100 ?PT Time Calculation (min) (ACUTE ONLY): 34 min ? ?Charges:  $Gait Training: 8-22 mins ?$Therapeutic Exercise: 8-22 mins          ?          ? Delice Bison, PT ? ?Acute Rehab Dept [facility]) [phone] ?Pager [phone] ? ?07/24/2021 ? ? ? ?Taras Rask ?07/24/2021, 11:05 AM ? ?

## 2021-07-25 ENCOUNTER — Telehealth: Payer: Self-pay | Admitting: Orthopaedic Surgery

## 2021-07-25 ENCOUNTER — Other Ambulatory Visit: Payer: Self-pay

## 2021-07-25 DIAGNOSIS — Z96652 Presence of left artificial knee joint: Secondary | ICD-10-CM

## 2021-07-25 NOTE — Telephone Encounter (Signed)
Patient was told she needed in-home Physical Therapy but was told they are understaffed and no one can come to the home and she needs to know what is her next step does she consider another agency? Please call and advise she states she has been doing her exercises but she cannot bend her knee. ?

## 2021-07-25 NOTE — Telephone Encounter (Signed)
Patient called. Says she has not heard anything about home physical therapy. Would like to know if someone will be coming out to her home for PT? [phone] ?

## 2021-07-25 NOTE — Telephone Encounter (Signed)
Duplicate message. 

## 2021-07-25 NOTE — Telephone Encounter (Signed)
See message.

## 2021-07-26 ENCOUNTER — Encounter: Payer: Self-pay | Admitting: Physical Therapy

## 2021-07-26 ENCOUNTER — Other Ambulatory Visit: Payer: Self-pay

## 2021-07-26 ENCOUNTER — Ambulatory Visit (INDEPENDENT_AMBULATORY_CARE_PROVIDER_SITE_OTHER): Payer: BC Managed Care – PPO | Admitting: Physical Therapy

## 2021-07-26 DIAGNOSIS — M25662 Stiffness of left knee, not elsewhere classified: Secondary | ICD-10-CM | POA: Diagnosis not present

## 2021-07-26 DIAGNOSIS — R600 Localized edema: Secondary | ICD-10-CM | POA: Diagnosis not present

## 2021-07-26 DIAGNOSIS — M6281 Muscle weakness (generalized): Secondary | ICD-10-CM | POA: Diagnosis not present

## 2021-07-26 DIAGNOSIS — M25562 Pain in left knee: Secondary | ICD-10-CM | POA: Diagnosis not present

## 2021-07-26 DIAGNOSIS — R262 Difficulty in walking, not elsewhere classified: Secondary | ICD-10-CM

## 2021-07-26 NOTE — Therapy (Signed)
?OUTPATIENT PHYSICAL THERAPY LOWER EXTREMITY EVALUATION ? ? ?Patient Name: Maria Becker ?MRN: [id number] ?DOB:05/20/1960, 61 y.o., female ?Today's Date: 07/26/2021 ? ? PT End of Session - 07/26/21 1418   ? ? Visit Number 1   ? Number of Visits 26   ? Date for PT Re-Evaluation 10/21/21   ? PT Start Time 1308   ? PT Stop Time 1355   ? PT Time Calculation (min) 47 min   ? Equipment Utilized During Treatment Gait belt;Left knee immobilizer   ? Activity Tolerance Patient tolerated treatment well;Patient limited by pain   ? Behavior During Therapy [facility] for tasks assessed/performed   ? ?  ?  ? ?  ? ? ?Past Medical History:  ?Diagnosis Date  ? Arthritis   ? 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 CAD (coronary artery disease)   ? a. cath 07/2015: 30-50% mid-LAD stenosis but otherwise normal cors  ? CHF (congestive heart failure) (HCC)   ? Dyspnea   ? SOME SOB WITH EXERTION  ? Family history of adverse reaction to anesthesia   ? MOTHER SLOW TO WAKE UP  ? Fibromyalgia   ? GERD (gastroesophageal reflux disease)   ? Headache   ? History of kidney stones   ? Hyperlipidemia   ? a. not currently on statin therapy  ? Hypothyroidism   ? Insomnia   ? Pneumonia   ? HX OF AS A BABY  ? ?Past Surgical History:  ?Procedure Laterality Date  ? ABDOMINAL HYSTERECTOMY    ? BACK SURGERY    ? CARDIAC CATHETERIZATION N/A 07/20/2015  ? Procedure: Left Heart Cath and Coronary Angiography;  Surgeon: Lyn Records, MD;  Location: [facility] INVASIVE CV LAB;  Service: Cardiovascular;  Laterality: N/A;  ? CESAREAN SECTION    ? L4-L5 fusion    ? RIGHT/LEFT HEART CATH AND CORONARY ANGIOGRAPHY N/A 04/15/2021  ? Procedure: RIGHT/LEFT HEART CATH AND CORONARY ANGIOGRAPHY;  Surgeon: Kathleene Hazel, MD;  Location: [facility] INVASIVE CV LAB;  Service: Cardiovascular;  Laterality: N/A;  ? ?Patient Active Problem List  ? Diagnosis Date Noted  ? Status post total left knee replacement 07/22/2021  ? Unilateral primary osteoarthritis, left knee 06/13/2021  ? Dyspnea on  exertion   ? Angina decubitus (HCC) 07/20/2015  ? CAD (coronary artery disease), native coronary artery, LAD on cardiac CT 07/20/2015  ? HTN (hypertension) 07/20/2015  ? Family history of premature CAD 07/20/2015  ? Hyperlipidemia LDL goal <100 07/20/2015  ? Angina at rest [facility]) 07/20/2015  ? Abnormal nuclear stress test   ? 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 ? ?PCP: Benita Stabile, MD ? ?REFERRING PROVIDER: Doneen Poisson MD ? ?REFERRING DIAG: S34.196 history of left knee replacement ? ?THERAPY DIAG:  ?Acute pain of left knee ? ?Stiffness of left knee, not elsewhere classified ? ?Localized edema ? ?Muscle weakness (generalized) ? ?Difficulty in walking, not elsewhere classified ? ?ONSET DATE: 07/22/2021: Left TKA ? ?SUBJECTIVE:  ? ?SUBJECTIVE STATEMENT: ?Pt s/p left TKA on 07/22/2021. Pt amb with RW ? ?PERTINENT HISTORY: ?3+ comorbidities: CAD, CHF, arthritis, dyspnea, GERD, HA, kidney stones, h/o penumonia, hyperlipidemia ? ?PAIN:  ?Are you having pain? Yes: NPRS scale: 3/10 ?Pain location: left knee ?Pain description: achy ?Aggravating factors: bending ?Relieving factors: resting, medication, icing ? ?PRECAUTIONS: None ? ?WEIGHT BEARING RESTRICTIONS No ? ?FALLS:  ?Has patient fallen in last 6 months? No ? ?LIVING ENVIRONMENT: ?Lives with: lives with their family and lives with their spouse ?Lives in: House/apartment ?Stairs: Yes: External: 5  steps; bilateral but cannot reach both ?Has following equipment at home: Dan HumphreysWalker - 2 wheeled ? ?OCCUPATION: hair stylist ? ?PLOF: Independent ? ?PATIENT GOALS Walk without pain, get back to work ? ? ?OBJECTIVE:  ? ?DIAGNOSTIC FINDINGS: post op X-ray ? ?PATIENT SURVEYS:  ? 07/26/2021: FOTO: 13% (predicted 43%) ? ? ?COGNITION: ? 07/26/2021: Overall cognitive status: Within functional limits for tasks assessed   ?  ?SENSATION: ?07/26/2021: WFL ? ? ?POSTURE:  ?Forward head and shoulders, increased thoracic kyphosis ? ?PALPATION: ?07/26/2021: TTP: around joint line ?   ?EDEMA:  ?  07/26/2021: Rt knee circumference: 42.5 centimeters ?          Left knee circumference: 50 centimeters ? ? ?LE ROM: ? ?Active ROM Right ?07/26/2021 Left ?07/26/2021  ?Knee flexion 124 50  ?Knee extension -2 -10  ? (Blank rows = not tested) ? ? ? Passive ROM Left ?07/26/21  ?Knee flexion 58  ?Knee extension            -8  ? ? ? ?LE MMT: ? ?MMT Right ?07/26/2021 Left ?07/26/2021  ?Hip flexion 5/5 5/5  ?Hip extension    ?Hip abduction 5/5 5/5  ?Hip adduction 5/5 5/5  ?Knee flexion 5/5 2/5  ?Knee extension 5/5 2/5  ? (Blank rows = not tested) ? ? ? ?FUNCTIONAL TESTS:  ?07/26/2021: 5 times sit to stand: 47 seconds with RW with UE support ? ?GAIT: ?07/26/2021:  ?Distance walked: 50 feet ?Assistive device utilized: Environmental consultantWalker - 2 wheeled ?Level of assistance: SBA ?Comments: step to gait pattern with left knee immobilizer ? ? ? ?TODAY'S TREATMENT: ?07/26/2021:  ?HEP instruction/performance c cues for techniques, handout provided.  Trial set performed of each for comprehension and symptom assessment.  See below for exercise list. ? ? ?PATIENT EDUCATION:  ?Education details: PT POC, HEP ?Person educated: Patient and Spouse ?Education method: Explanation, Demonstration, Tactile cues, Verbal cues, and Handouts ?Education comprehension: verbalized understanding, returned demonstration, and needs further education ? ? ?HOME EXERCISE PROGRAM: ?Access Code: [email or web address] ?URL: [email or web address] ?Date: 07/26/2021 ?Prepared by: Narda AmberJennifer Jaquarious Grey ? ?Exercises ?- Seated Heel Slide  - 3 x daily - 7 x weekly - 2 sets - 10 reps ?- Sit to Stand with Counter Support  - 3 x daily - 7 x weekly - 10 reps ?- Supine Knee Extension Strengthening  - 3 x daily - 7 x weekly - 2 sets - 10 reps - 3 seconds hold ?- Seated Long Arc Quad  - 3 x daily - 7 x weekly - 2 sets - 10 reps - 2-3 seconds hold ?- Seated Knee Flexion AAROM  - 3 x daily - 7 x weekly - 10 reps ?- Seated Hip Adduction Isometrics with Ball  - 3 x daily - 7 x weekly - 10 reps - 5 seconds  hold ? ?ASSESSMENT: ? ?CLINICAL IMPRESSION: ?Patient is a 61 y.o. who comes to clinic with complaints of left knee pain s/p left TKA on 07/22/2021 with mobility, strength and movement coordination deficits that impair their ability to perform usual daily and recreational functional activities without increase difficulty/symptoms at this time.  Patient to benefit from skilled PT services to address impairments and limitations to improve to previous level of function without restriction secondary to condition.  ? ?OBJECTIVE IMPAIRMENTS Abnormal gait, decreased activity tolerance, decreased balance, decreased endurance, decreased mobility, difficulty walking, decreased ROM, decreased strength, increased edema, impaired flexibility, obesity, and pain.  ? ?ACTIVITY LIMITATIONS cleaning, community activity, and occupation.  ? ?PERSONAL FACTORS 3+ comorbidities: CAD,  CHF, arthritis, dyspnea, GERD, HA, kidney stones, h/o penumonia, hyperlipidemia  are also affecting patient's functional outcome.  ? ? ?REHAB POTENTIAL: Excellent ? ?CLINICAL DECISION MAKING: Stable/uncomplicated ? ?EVALUATION COMPLEXITY: Low ? ? ?GOALS: ?Goals reviewed with patient? Yes ?Short term PT Goals (target date for Short term goals are 4 weeks 08/26/2021) ?Patient will demonstrate independent use of initial home exercise program to maintain progress from in clinic treatments. ?Goal status: New ?  ?Long term PT goals (target dates for all long term goals are 12 weeks  10/21/2021) ?Patient will demonstrate/report pain at worst less than or equal to 2/10 to facilitate minimal limitation in daily activity secondary to pain symptoms. ?Goal status: New ? ?Patient will demonstrate independent use of home exercise program to facilitate ability to maintain/progress functional gains from skilled physical therapy services. ?Goal status: New ? ?Patient will demonstrate FOTO outcome > or = 43 % to indicate reduced disability due to condition. ?Goal status: New ? ?Pt  will be able to amb with LRAD on community based surfaces safely >/= 1500 feet. ?Goal status: New ? ?    5.  Pt will be able to navigate stairs with single hand rail with step over step gait pattern w

## 2021-07-28 ENCOUNTER — Encounter: Payer: Self-pay | Admitting: Physical Therapy

## 2021-07-28 ENCOUNTER — Ambulatory Visit (INDEPENDENT_AMBULATORY_CARE_PROVIDER_SITE_OTHER): Payer: BC Managed Care – PPO | Admitting: Physical Therapy

## 2021-07-28 DIAGNOSIS — R600 Localized edema: Secondary | ICD-10-CM | POA: Diagnosis not present

## 2021-07-28 DIAGNOSIS — M6281 Muscle weakness (generalized): Secondary | ICD-10-CM

## 2021-07-28 DIAGNOSIS — M25562 Pain in left knee: Secondary | ICD-10-CM

## 2021-07-28 DIAGNOSIS — M25662 Stiffness of left knee, not elsewhere classified: Secondary | ICD-10-CM | POA: Diagnosis not present

## 2021-07-28 DIAGNOSIS — R262 Difficulty in walking, not elsewhere classified: Secondary | ICD-10-CM

## 2021-07-28 NOTE — Therapy (Signed)
OUTPATIENT PHYSICAL THERAPY TREATMENT NOTE   Patient Name: Maria Becker MRN: [id number] DOB:[birth year], 61 y.o., female Today's Date: 07/28/2021  PCP: Celene Squibb, MD REFERRING PROVIDER: Jean Rosenthal MD  END OF SESSION:   PT End of Session - 07/28/21 0807     Visit Number 2    Number of Visits 26    Date for PT Re-Evaluation 10/21/21    Authorization Type BCBS    Authorization Time Period 25% COINSURANCE  Deductible ($2,000.00)  Deductible has been met  Out-of-Pocket Limit ($8,700.00) $6,295.64 Paid  $2,404.36 to go    PT Start Time 0800    PT Stop Time 0855    PT Time Calculation (min) 55 min    Equipment Utilized During Treatment Gait belt;Left knee immobilizer    Activity Tolerance Patient tolerated treatment well;Patient limited by pain    Behavior During Therapy WFL for tasks assessed/performed             Past Medical History:  Diagnosis Date   Arthritis    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 cath 07/2015: 30-50% mid-LAD stenosis but otherwise normal cors   CHF (congestive heart failure) (HCC)    Dyspnea    SOME SOB WITH EXERTION   Family history of adverse reaction to anesthesia    MOTHER SLOW TO WAKE UP   Fibromyalgia    GERD (gastroesophageal reflux disease)    Headache    History of kidney stones    Hyperlipidemia    a. not currently on statin therapy   Hypothyroidism    Insomnia    Pneumonia    HX OF AS A BABY   Past Surgical History:  Procedure Laterality Date   ABDOMINAL HYSTERECTOMY     BACK SURGERY     CARDIAC CATHETERIZATION N/A 07/20/2015   Procedure: Left Heart Cath and Coronary Angiography;  Surgeon: Belva Crome, MD;  Location: [place] CV LAB;  Service: Cardiovascular;  Laterality: N/A;   CESAREAN SECTION     L4-L5 fusion     RIGHT/LEFT HEART CATH AND CORONARY ANGIOGRAPHY N/A 04/15/2021   Procedure: RIGHT/LEFT HEART CATH AND CORONARY ANGIOGRAPHY;  Surgeon: Burnell Blanks,  MD;  Location: [place] CV [facility];  Service: Cardiovascular;  Laterality: N/A;   TOTAL KNEE ARTHROPLASTY Left 07/22/2021   Procedure: LEFT TOTAL KNEE ARTHROPLASTY;  Surgeon: [name], MD;  Location: [facility];  Service: Orthopedics;  Laterality: Left;  RFNA   Patient Active Problem List   Diagnosis Date Noted   Status post total left knee replacement 07/22/2021   Unilateral primary osteoarthritis, left knee 06/13/2021   Dyspnea on exertion    Angina decubitus ([place]) 07/20/2015   CAD (coronary artery disease), native coronary artery, LAD on cardiac CT 07/20/2015   HTN (hypertension) 07/20/2015   Family history of premature CAD 07/20/2015   Hyperlipidemia LDL goal <100 07/20/2015   Angina at rest [facility]) 07/20/2015   Abnormal nuclear stress test    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   PAIN:  Are you having  pain? Yes: NPRS scale: today 6/10 and over last week lowest 2/10 & highest 6/10 Pain location: Left knee anterior more medial & posterior Pain description: jabbing & throbbing Aggravating factors: bending knee Relieving factors: Advil, yesterday Rx 2x, ice  OBJECTIVE: (objective measures completed at initial evaluation unless otherwise dated)  OBJECTIVE:    PATIENT SURVEYS:  07/26/2021: FOTO: 13% (predicted 43%)   POSTURE:  07/26/2021: Forward head and shoulders, increased thoracic kyphosis   PALPATION: 07/26/2021: TTP: around joint line              EDEMA:  07/26/2021: Rt knee circumference: 42.5  centimeters        Left knee circumference: 50 centimeters     LE ROM:   Active ROM Right 07/26/2021 Left 07/26/2021  Knee flexion 124 50  Knee extension -2 -10   (Blank rows = not tested)      Passive ROM Left 07/26/21  Knee flexion 58  Knee extension            -8      LE MMT:   MMT Right 07/26/2021 Left 07/26/2021  Hip flexion 5/5 5/5  Hip extension      Hip abduction 5/5 5/5  Hip adduction 5/5 5/5  Knee flexion 5/5 2/5  Knee extension 5/5 2/5   (Blank rows = not tested)     FUNCTIONAL TESTS:  07/26/2021: 5 times sit to stand: 47 seconds with RW with UE support   GAIT: 07/26/2021: Distance walked: 50 feet Assistive device utilized: Environmental consultant - 2 wheeled Level of assistance: SBA Comments: step to gait pattern with left knee immobilizer       TODAY'S TREATMENT: 07/28/2021: Therapeutic Exercise:  Aerobic: Nustep seat 7 for ROM level 5 with BLEs & BUEs for 8 min with 5 sec hold flexion Supine: quad set with ankle on towel roll 5 sec 10 reps 2 sets.    Heel slides LLE on ball with strap assist 5 sec hold ext & flex 15 reps. Prone:  Seated:heel slides with LLE on pillow case 10 reps 3 sets with 5 sec hold flex & ext.   LAQ with strap assist 10 reps 2 sets.   Standing: Neuromuscular Re-education: Manual Therapy: Therapeutic Activity:  PT instructed in gait with step thru pattern rolling off toe in terminal stance to flex knee and knee ext with initial contact. Pt return demo understanding with slow cautious gait.  Self Care: PT educated verbally on elevation with LEs higher than heart & muscle activity like alphabet with ankle. PT recommended >/= 15 min >/= 2x/day. Pt verbalized understanding.  PT instructed in use of pillows to "tent" sheets off feet and another pillow bw LEs in sidelying. Pt verbalized understanding.  Trigger Point Dry Needling:  Modalities: Vaso left knee medium compression 34* 10 min.    07/26/2021:  HEP instruction/performance c cues for  techniques, handout provided.  Trial set performed of each for comprehension and symptom assessment.  See below for exercise list.     PATIENT EDUCATION:  Education details: PT POC, HEP Person educated: Patient and Spouse Education method: Explanation, Demonstration, Tactile cues, Verbal cues, and Handouts Education comprehension: verbalized understanding, returned demonstration, and needs further education     HOME EXERCISE PROGRAM: Access Code: [email or web address] URL: [email or web address] Date: 07/26/2021 Prepared by: Kearney Hard   Exercises - Seated Heel Slide  - 3 x daily - 7 x weekly - 2 sets - 10 reps - Sit to Stand with Counter Support  - 3 x  daily - 7 x weekly - 10 reps - Supine Knee Extension Strengthening  - 3 x daily - 7 x weekly - 2 sets - 10 reps - 3 seconds hold - Seated Long Arc Quad  - 3 x daily - 7 x weekly - 2 sets - 10 reps - 2-3 seconds hold - Seated Knee Flexion AAROM  - 3 x daily - 7 x weekly - 10 reps - Seated Hip Adduction Isometrics with Ball  - 3 x daily - 7 x weekly - 10 reps - 5 seconds hold   ASSESSMENT:   CLINICAL IMPRESSION: Patient appears to have better understanding of HEP and elevation.  She improved her motion with repetition & PT instruction but continues to be guarded.  She requires assist to move LE on/off machines or bed.    OBJECTIVE IMPAIRMENTS Abnormal gait, decreased activity tolerance, decreased balance, decreased endurance, decreased mobility, difficulty walking, decreased ROM, decreased strength, increased edema, impaired flexibility, obesity, and pain.    ACTIVITY LIMITATIONS cleaning, community activity, and occupation.    PERSONAL FACTORS 3+ comorbidities: CAD, CHF, arthritis, dyspnea, GERD, HA, kidney stones, h/o penumonia, hyperlipidemia  are also affecting patient's functional outcome.      REHAB POTENTIAL: Excellent   CLINICAL DECISION MAKING: Stable/uncomplicated   EVALUATION COMPLEXITY: Low     GOALS: Goals  reviewed with patient? Yes Short term PT Goals (target date for Short term goals are 4 weeks 08/26/2021) Patient will demonstrate independent use of initial home exercise program to maintain progress from in clinic treatments. Goal status: New   Long term PT goals (target dates for all long term goals are 12 weeks  10/21/2021) Patient will demonstrate/report pain at worst less than or equal to 2/10 to facilitate minimal limitation in daily activity secondary to pain symptoms. Goal status: New   Patient will demonstrate independent use of home exercise program to facilitate ability to maintain/progress functional gains from skilled physical therapy services. Goal status: New   Patient will demonstrate FOTO outcome > or = 43 % to indicate reduced disability due to condition. Goal status: New   Pt will be able to amb with LRAD on community based surfaces safely >/= 1500 feet. Goal status: New       5.  Pt will be able to navigate stairs with single hand rail with step over step gait pattern with pain </= 2/10 in left knee.   Goal status: New       PLAN: PT FREQUENCY:  3x/ week for first 2 weeks and then drop 2x/ week for 12 weeks total.    PT DURATION: 12 weeks   PLANNED INTERVENTIONS: Therapeutic exercises, Therapeutic activity, Neuromuscular re-education, Balance training, Gait training, Patient/Family education, Joint mobilization, Stair training, Dry Needling, Electrical stimulation, Cryotherapy, Moist heat, scar mobilization, Taping, Vasopneumatic device, and Manual therapy   PLAN FOR NEXT SESSION: continue with  Nustep, manual therapy & there ex for knee ROM, quad strengthening, add basic standing balance, gait training, vasopneumatic   Jamey Reas, PT, DPT 07/28/2021, 9:04 AM

## 2021-07-29 ENCOUNTER — Encounter: Payer: BC Managed Care – PPO | Admitting: Rehabilitative and Restorative Service Providers"

## 2021-08-01 ENCOUNTER — Encounter: Payer: Self-pay | Admitting: Physical Therapy

## 2021-08-01 ENCOUNTER — Ambulatory Visit (INDEPENDENT_AMBULATORY_CARE_PROVIDER_SITE_OTHER): Payer: BC Managed Care – PPO | Admitting: Physical Therapy

## 2021-08-01 DIAGNOSIS — M6281 Muscle weakness (generalized): Secondary | ICD-10-CM | POA: Diagnosis not present

## 2021-08-01 DIAGNOSIS — R262 Difficulty in walking, not elsewhere classified: Secondary | ICD-10-CM

## 2021-08-01 DIAGNOSIS — M25662 Stiffness of left knee, not elsewhere classified: Secondary | ICD-10-CM

## 2021-08-01 DIAGNOSIS — M25562 Pain in left knee: Secondary | ICD-10-CM

## 2021-08-01 DIAGNOSIS — R600 Localized edema: Secondary | ICD-10-CM

## 2021-08-01 NOTE — Therapy (Signed)
OUTPATIENT PHYSICAL THERAPY TREATMENT NOTE   Patient Name: Maria Becker MRN: [id number] DOB:[birth year], 61 y.o., female Today's Date: 08/01/2021  PCP: Celene Squibb, MD REFERRING PROVIDER: Jean Rosenthal MD  END OF SESSION:   PT End of Session - 08/01/21 0817     Visit Number 3    Number of Visits 26    Date for PT Re-Evaluation 10/21/21    Authorization Type BCBS    Authorization Time Period 25% COINSURANCE  Deductible ($2,000.00)  Deductible has been met  Out-of-Pocket Limit ($8,700.00) $6,295.64 Paid  $2,404.36 to go    PT Start Time 0802    PT Stop Time 0846    PT Time Calculation (min) 44 min    Equipment Utilized During Treatment Gait belt;Left knee immobilizer    Activity Tolerance Patient tolerated treatment well;Patient limited by pain    Behavior During Therapy WFL for tasks assessed/performed             Past Medical History:  Diagnosis Date   Arthritis    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 cath 07/2015: 30-50% mid-LAD stenosis but otherwise normal cors   CHF (congestive heart failure) (HCC)    Dyspnea    SOME SOB WITH EXERTION   Family history of adverse reaction to anesthesia    MOTHER SLOW TO WAKE UP   Fibromyalgia    GERD (gastroesophageal reflux disease)    Headache    History of kidney stones    Hyperlipidemia    a. not currently on statin therapy   Hypothyroidism    Insomnia    Pneumonia    HX OF AS A BABY   Past Surgical History:  Procedure Laterality Date   ABDOMINAL HYSTERECTOMY     BACK SURGERY     CARDIAC CATHETERIZATION N/A 07/20/2015   Procedure: Left Heart Cath and Coronary Angiography;  Surgeon: Belva Crome, MD;  Location: [place] [facility];  Service: Cardiovascular;  Laterality: N/A;   CESAREAN SECTION     L4-L5 fusion     RIGHT/LEFT HEART CATH AND CORONARY ANGIOGRAPHY N/A 04/15/2021   Procedure: RIGHT/LEFT HEART CATH AND CORONARY ANGIOGRAPHY;  Surgeon: Burnell Blanks,  MD;  Location: [place] [facility];  Service: Cardiovascular;  Laterality: N/A;   TOTAL KNEE ARTHROPLASTY Left 07/22/2021   Procedure: LEFT TOTAL KNEE ARTHROPLASTY;  Surgeon: [name], MD;  Location: [facility];  Service: Orthopedics;  Laterality: Left;  RFNA   Patient Active Problem List   Diagnosis Date Noted   Status post total left knee replacement 07/22/2021   Unilateral primary osteoarthritis, left knee 06/13/2021   Dyspnea on exertion    Angina decubitus ([place]) 07/20/2015   CAD (coronary artery disease), native coronary artery, LAD on cardiac CT 07/20/2015   HTN (hypertension) 07/20/2015   Family history of premature CAD 07/20/2015   Hyperlipidemia LDL goal <100 07/20/2015   Angina at rest [facility]) 07/20/2015   Abnormal nuclear stress test    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 She feels something is not  right and that her knee cap feels like it wants to pull lateral.   PAIN:  Are you having pain? Yes: NPRS scale: today 7/10  Pain location: Left knee anterior more medial & posterior Pain description: jabbing & throbbing Aggravating factors: bending knee Relieving factors: Advil, yesterday Rx 2x, ice  OBJECTIVE: (objective measures completed at initial evaluation unless otherwise dated)  OBJECTIVE:    PATIENT SURVEYS:  07/26/2021: FOTO: 13% (predicted 43%)   POSTURE:  07/26/2021: Forward head and shoulders, increased thoracic kyphosis   PALPATION: 07/26/2021: TTP: around joint line               EDEMA:  07/26/2021: Rt knee circumference: 42.5 centimeters        Left knee circumference: 50 centimeters     LE ROM:   Active ROM Right 07/26/2021 Left 07/26/2021  Knee flexion 124 50  Knee extension -2 -10   (Blank rows = not tested)      Passive ROM Left 07/26/21  Knee flexion 58  Knee extension            -8      LE MMT:   MMT Right 07/26/2021 Left 07/26/2021  Hip flexion 5/5 5/5  Hip extension      Hip abduction 5/5 5/5  Hip adduction 5/5 5/5  Knee flexion 5/5 2/5  Knee extension 5/5 2/5   (Blank rows = not tested)     FUNCTIONAL TESTS:  07/26/2021: 5 times sit to stand: 47 seconds with RW with UE support   GAIT: 07/26/2021: Distance walked: 50 feet Assistive device utilized: Environmental consultant - 2 wheeled Level of assistance: SBA Comments: step to gait pattern with left knee immobilizer       TODAY'S TREATMENT: 08/01/21 Attempted seated knee flexion stretch but unable to sit back on table and let leg hang due to pain Supine quad sets 5 sec X 15 Supine heelslides AAROM 5 sec 2X10 Low load long duration stretch with knee on 2 towel rolls 3 min then added towel roll 3 min, then finally progressed to black bolster  X3 min Nu step X 6 min L5 UE/LE Manual therapy: Rt knee PROM to tolerance  07/28/2021: Therapeutic Exercise:  Aerobic: Nustep seat 7 for ROM level 5 with BLEs & BUEs for 8 min with 5 sec hold flexion Supine: quad set with ankle on towel roll 5 sec 10 reps 2 sets.    Heel slides LLE on ball with strap assist 5 sec hold ext & flex 15 reps. Prone:  Seated:heel slides with LLE on pillow case 10 reps 3 sets with 5 sec hold flex & ext.   LAQ with strap assist 10 reps 2 sets.   Standing: Neuromuscular Re-education: Manual Therapy: Therapeutic Activity:  PT instructed in gait with step thru pattern rolling off toe in terminal stance to flex knee and knee ext with initial contact. Pt return demo understanding with slow cautious gait.  Self Care: PT educated  verbally on elevation with LEs higher than heart & muscle activity like alphabet with ankle. PT recommended >/= 15 min >/= 2x/day. Pt verbalized understanding.  PT instructed in use of pillows to "tent" sheets off feet and another pillow bw LEs in sidelying. Pt verbalized understanding.  Trigger Point Dry Needling:  Modalities: Vaso left knee medium compression 34* 10 min.    PATIENT EDUCATION:  Education details: PT POC, HEP Person educated: Patient and Spouse Education method: Explanation, Demonstration, Corporate treasurer cues, Verbal cues, and Handouts Education comprehension: verbalized understanding,  returned demonstration, and needs further education     HOME EXERCISE PROGRAM: Access Code: [email or web address] URL: [email or web address] Date: 07/26/2021 Prepared by: Kearney Hard   Exercises - Seated Heel Slide  - 3 x daily - 7 x weekly - 2 sets - 10 reps - Sit to Stand with Counter Support  - 3 x daily - 7 x weekly - 10 reps - Supine Knee Extension Strengthening  - 3 x daily - 7 x weekly - 2 sets - 10 reps - 3 seconds hold - Seated Long Arc Quad  - 3 x daily - 7 x weekly - 2 sets - 10 reps - 2-3 seconds hold - Seated Knee Flexion AAROM  - 3 x daily - 7 x weekly - 10 reps - Seated Hip Adduction Isometrics with Ball  - 3 x daily - 7 x weekly - 10 reps - 5 seconds hold   ASSESSMENT:   CLINICAL IMPRESSION: She was very limited by pain and guarding and only able to flex her knee to 30 deg on her own and 40 deg with manual assistance. She was also limited by nausea and I advised her not to take meds on empty stomach. She will see MD Thursday but also advised her to call to see if she can get in any earlier as she is not able to tolerate therapy today. She does have a lot of edema in her left leg but there is not a lot of redness or heat around her knee as far as I could tell. She does still have dressing over incision so I am unable to observe how her incision looks.   OBJECTIVE IMPAIRMENTS  Abnormal gait, decreased activity tolerance, decreased balance, decreased endurance, decreased mobility, difficulty walking, decreased ROM, decreased strength, increased edema, impaired flexibility, obesity, and pain.    ACTIVITY LIMITATIONS cleaning, community activity, and occupation.    PERSONAL FACTORS 3+ comorbidities: CAD, CHF, arthritis, dyspnea, GERD, HA, kidney stones, h/o penumonia, hyperlipidemia  are also affecting patient's functional outcome.      REHAB POTENTIAL: Excellent   CLINICAL DECISION MAKING: Stable/uncomplicated   EVALUATION COMPLEXITY: Low     GOALS: Goals reviewed with patient? Yes Short term PT Goals (target date for Short term goals are 4 weeks 08/26/2021) Patient will demonstrate independent use of initial home exercise program to maintain progress from in clinic treatments. Goal status: New   Long term PT goals (target dates for all long term goals are 12 weeks  10/21/2021) Patient will demonstrate/report pain at worst less than or equal to 2/10 to facilitate minimal limitation in daily activity secondary to pain symptoms. Goal status: New   Patient will demonstrate independent use of home exercise program to facilitate ability to maintain/progress functional gains from skilled physical therapy services. Goal status: New   Patient will demonstrate FOTO outcome > or = 43 % to indicate reduced disability due to condition. Goal status: New   Pt will be able to amb with LRAD on community based surfaces safely >/= 1500 feet. Goal status: New       5.  Pt will be able to navigate stairs with single hand rail with step over step gait pattern with pain </= 2/10 in left knee.   Goal status: New       PLAN: PT FREQUENCY:  3x/ week for first 2 weeks and then drop 2x/ week for 12 weeks total.    PT DURATION: 12 weeks   PLANNED INTERVENTIONS: Therapeutic exercises, Therapeutic activity, Neuromuscular re-education, Balance  training, Gait training,  Patient/Family education, Joint mobilization, Stair training, Dry Needling, Electrical stimulation, Cryotherapy, Moist heat, scar mobilization, Taping, Vasopneumatic device, and Manual therapy   PLAN FOR NEXT SESSION: ROM emphasis to tolerance. continue with  Nustep, manual therapy & there ex for knee ROM, quad strengthening, add basic standing balance, gait training, vasopneumatic   Debbe Odea, PT, DPT 08/01/2021, 8:17 AM

## 2021-08-02 ENCOUNTER — Telehealth: Payer: Self-pay | Admitting: Orthopaedic Surgery

## 2021-08-02 ENCOUNTER — Other Ambulatory Visit: Payer: Self-pay | Admitting: Orthopaedic Surgery

## 2021-08-02 MED ORDER — HYDROCODONE-ACETAMINOPHEN 7.5-325 MG PO TABS: 1.0000 | ORAL_TABLET | ORAL | 0 refills | Status: DC | PRN

## 2021-08-02 NOTE — Telephone Encounter (Signed)
Pt called requesting a refill of her pain medication. Please send to pharmacy on file. Pt phone number is [phone].

## 2021-08-02 NOTE — Telephone Encounter (Signed)
Please advise 

## 2021-08-03 ENCOUNTER — Ambulatory Visit (INDEPENDENT_AMBULATORY_CARE_PROVIDER_SITE_OTHER): Payer: BC Managed Care – PPO | Admitting: Physical Therapy

## 2021-08-03 ENCOUNTER — Encounter: Payer: Self-pay | Admitting: Physical Therapy

## 2021-08-03 DIAGNOSIS — R262 Difficulty in walking, not elsewhere classified: Secondary | ICD-10-CM

## 2021-08-03 DIAGNOSIS — M25562 Pain in left knee: Secondary | ICD-10-CM | POA: Diagnosis not present

## 2021-08-03 DIAGNOSIS — R600 Localized edema: Secondary | ICD-10-CM | POA: Diagnosis not present

## 2021-08-03 DIAGNOSIS — M6281 Muscle weakness (generalized): Secondary | ICD-10-CM | POA: Diagnosis not present

## 2021-08-03 DIAGNOSIS — M25662 Stiffness of left knee, not elsewhere classified: Secondary | ICD-10-CM

## 2021-08-03 NOTE — Therapy (Signed)
OUTPATIENT PHYSICAL THERAPY TREATMENT NOTE   Patient Name: Maria Becker MRN: [id number] DOB:August 28, 1960, 61 y.o., female Today's Date: 08/03/2021  PCP: Celene Squibb, MD REFERRING PROVIDER: Jean Rosenthal MD  END OF SESSION:   PT End of Session - 08/03/21 1105     Visit Number 4    Number of Visits 26    Date for PT Re-Evaluation 10/21/21    Authorization Type BCBS    Authorization Time Period 25% COINSURANCE  Deductible ($2,000.00)  Deductible has been met  Out-of-Pocket Limit ($8,700.00) $6,295.64 Paid  $2,404.36 to go    PT Start Time 1100    PT Stop Time 1152    PT Time Calculation (min) 52 min    Equipment Utilized During Treatment Gait belt;Left knee immobilizer    Activity Tolerance Patient tolerated treatment well;Patient limited by pain    Behavior During Therapy WFL for tasks assessed/performed              Past Medical History:  Diagnosis Date   Arthritis    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 cath 07/2015: 30-50% mid-LAD stenosis but otherwise normal cors   CHF (congestive heart failure) (HCC)    Dyspnea    SOME SOB WITH EXERTION   Family history of adverse reaction to anesthesia    MOTHER SLOW TO WAKE UP   Fibromyalgia    GERD (gastroesophageal reflux disease)    Headache    History of kidney stones    Hyperlipidemia    a. not currently on statin therapy   Hypothyroidism    Insomnia    Pneumonia    HX OF AS A BABY   Past Surgical History:  Procedure Laterality Date   ABDOMINAL HYSTERECTOMY     BACK SURGERY     CARDIAC CATHETERIZATION N/A 07/20/2015   Procedure: Left Heart Cath and Coronary Angiography;  Surgeon: Belva Crome, MD;  Location: [place] [facility];  Service: Cardiovascular;  Laterality: N/A;   CESAREAN SECTION     L4-L5 fusion     RIGHT/LEFT HEART CATH AND CORONARY ANGIOGRAPHY N/A 04/15/2021   Procedure: RIGHT/LEFT HEART CATH AND CORONARY ANGIOGRAPHY;  Surgeon: Burnell Blanks, MD;  Location: [place] [facility];  Service: Cardiovascular;  Laterality: N/A;   TOTAL KNEE ARTHROPLASTY Left 07/22/2021   Procedure: LEFT TOTAL KNEE ARTHROPLASTY;  Surgeon: [name], MD;  Location: [facility];  Service: Orthopedics;  Laterality: Left;  RFNA   Patient Active Problem List   Diagnosis Date Noted   Status post total left knee replacement 07/22/2021   Unilateral primary osteoarthritis, left knee 06/13/2021   Dyspnea on exertion    Angina decubitus ([place]) 07/20/2015   CAD (coronary artery disease), native coronary artery, LAD on cardiac CT 07/20/2015   HTN (hypertension) 07/20/2015   Family history of premature CAD 07/20/2015   Hyperlipidemia LDL goal <100 07/20/2015   Angina at rest [facility]) 07/20/2015   Abnormal nuclear stress test    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 Yes: NPRS scale: today 2/10  Pain location: Left knee anterior more medial & posterior Pain description: jabbing & throbbing Aggravating factors: bending knee Relieving factors: Advil, yesterday Rx 2x, ice  OBJECTIVE: (objective measures completed at initial evaluation unless otherwise dated)   PATIENT SURVEYS:  07/26/2021: FOTO: 13% (predicted 43%)   POSTURE:  07/26/2021: Forward head and shoulders, increased thoracic kyphosis   PALPATION: 07/26/2021: TTP: around joint line              EDEMA:  07/26/2021: Rt knee  circumference: 42.5 centimeters        Left knee circumference: 50 centimeters     LE ROM:   Active ROM Right 07/26/2021 Left 07/26/2021  Knee flexion 124 50  Knee extension -2 -10   (Blank rows = not tested)      Passive ROM Left 07/26/21 Left 08/03/21  Knee flexion 58 52 (Seated)  Knee extension            -8       LE MMT:   MMT Right 07/26/2021 Left 07/26/2021  Hip flexion 5/5 5/5  Hip extension      Hip abduction 5/5 5/5  Hip adduction 5/5 5/5  Knee flexion 5/5 2/5  Knee extension 5/5 2/5   (Blank rows = not tested)     FUNCTIONAL TESTS:  07/26/2021: 5 times sit to stand: 47 seconds with RW with UE support   GAIT: 07/26/2021: Distance walked: 50 feet Assistive device utilized: Environmental consultant - 2 wheeled Level of assistance: SBA Comments: step to gait pattern with left knee immobilizer       TODAY'S TREATMENT: 08/03/21 Therex:      Aerobic: NuStep L4 x 8 min     Sitting: Seated tailgate flexion x 2 min; alt flex/ext; able to toe touch to floor if needed Lt LAQ x 30 reps with 3 sec hold     Supine: AA heel slides x 10 reps  Manual: STM with percussive device sitting edge of mat working on getting to dangle position.  Able to raise mat table with support under knee progressing to no support and pain minimal.  Pt with improved sitting tolerance today. Trial of PROM in sitting but unable to tolerate much pressure at this time without compensating Self-Care: Educated on edema management and recommending thigh high compression stockings, use of percussive device at home as she has one; and discussed decreasing reliance on external support (leg lifters, other leg, etc) to move LLE Modalities: Vaso: Lt knee, mod pressure x 10 min, 34 degrees   08/01/21 Attempted seated knee flexion stretch but unable to sit back on table and let leg hang due to pain Supine quad sets 5 sec X 15 Supine heelslides AAROM 5 sec 2X10 Low load long duration stretch with knee on 2 towel rolls  3 min then added towel roll 3 min, then finally progressed to black bolster  X3 min Nu step X 6 min L5 UE/LE Manual therapy: Rt knee PROM to tolerance  07/28/2021: Therapeutic Exercise:  Aerobic: Nustep seat 7 for ROM level 5 with BLEs & BUEs for 8 min with 5 sec hold flexion Supine: quad set with ankle on towel roll 5 sec 10 reps 2 sets.    Heel slides LLE on ball with strap assist 5 sec hold ext & flex 15 reps. Prone:  Seated:heel slides with LLE on pillow  case 10 reps 3 sets with 5 sec hold flex & ext.   LAQ with strap assist 10 reps 2 sets.   Standing: Neuromuscular Re-education: Manual Therapy: Therapeutic Activity:  PT instructed in gait with step thru pattern rolling off toe in terminal stance to flex knee and knee ext with initial contact. Pt return demo understanding with slow cautious gait.  Self Care: PT educated verbally on elevation with LEs higher than heart & muscle activity like alphabet with ankle. PT recommended >/= 15 min >/= 2x/day. Pt verbalized understanding.  PT instructed in use of pillows to "tent" sheets off feet and another pillow bw LEs in sidelying. Pt verbalized understanding.  Trigger Point Dry Needling:  Modalities: Vaso left knee medium compression 34* 10 min.    PATIENT EDUCATION:  Education details: PT POC, HEP Person educated: Patient and Spouse Education method: Explanation, Demonstration, Corporate treasurer cues, Verbal cues, and Handouts Education comprehension: verbalized understanding, returned demonstration, and needs further education     HOME EXERCISE PROGRAM: Access Code: [email or web address] URL: [email or web address] Date: 07/26/2021 Prepared by: Kearney Hard   Exercises - Seated Heel Slide  - 3 x daily - 7 x weekly - 2 sets - 10 reps - Sit to Stand with Counter Support  - 3 x daily - 7 x weekly - 10 reps - Supine Knee Extension Strengthening  - 3 x daily - 7 x weekly - 2 sets - 10 reps - 3 seconds hold - Seated Long Arc Quad  - 3 x daily  - 7 x weekly - 2 sets - 10 reps - 2-3 seconds hold - Seated Knee Flexion AAROM  - 3 x daily - 7 x weekly - 10 reps - Seated Hip Adduction Isometrics with Ball  - 3 x daily - 7 x weekly - 10 reps - 5 seconds hold   ASSESSMENT:   CLINICAL IMPRESSION: Pt continues to have elevated pain reports especially at night, but today had better tolerance to exercise.  With extended techniques to relax LLE able to get to 52 deg flexion in sitting today.  Swelling limiting mobility and ROM at this time, so hopefully can decrease with compression stockings and other edema management strategies.  Will continue to benefit from PT to maximize function.    OBJECTIVE IMPAIRMENTS Abnormal gait, decreased activity tolerance, decreased balance, decreased endurance, decreased mobility, difficulty walking, decreased ROM, decreased strength, increased edema, impaired flexibility, obesity, and pain.    ACTIVITY LIMITATIONS cleaning, community activity, and occupation.    PERSONAL FACTORS 3+ comorbidities: CAD, CHF, arthritis, dyspnea, GERD, HA, kidney stones, h/o penumonia, hyperlipidemia  are also affecting patient's functional outcome.      REHAB POTENTIAL: Excellent   CLINICAL DECISION MAKING: Stable/uncomplicated   EVALUATION COMPLEXITY: Low     GOALS: Goals reviewed with patient? Yes Short term PT Goals (target date for Short term goals are 4 weeks 08/26/2021) Patient will demonstrate independent use of initial home exercise program to maintain progress from in clinic treatments. Goal status: New   Long term PT goals (target dates for all long term goals are 12 weeks  10/21/2021) Patient will demonstrate/report pain at worst less than or equal to 2/10 to facilitate minimal limitation in daily activity secondary to pain symptoms. Goal status: New   Patient will demonstrate independent use of home exercise program to facilitate ability to maintain/progress functional gains from skilled physical therapy  services. Goal status: New   Patient will demonstrate FOTO outcome > or = 43 % to indicate reduced disability  due to condition. Goal status: New   Pt will be able to amb with LRAD on community based surfaces safely >/= 1500 feet. Goal status: New       5.  Pt will be able to navigate stairs with single hand rail with step over step gait pattern with pain </= 2/10 in left knee.   Goal status: New       PLAN: PT FREQUENCY:  3x/ week for first 2 weeks and then drop 2x/ week for 12 weeks total.    PT DURATION: 12 weeks   PLANNED INTERVENTIONS: Therapeutic exercises, Therapeutic activity, Neuromuscular re-education, Balance training, Gait training, Patient/Family education, Joint mobilization, Stair training, Dry Needling, Electrical stimulation, Cryotherapy, Moist heat, scar mobilization, Taping, Vasopneumatic device, and Manual therapy   PLAN FOR NEXT SESSION: continue ROM focus at this time, NuStep, strategies for edema/motion,  manual therapy & there ex for knee ROM, quad strengthening, add basic standing balance, gait training, vasopneumatic   Faustino Congress, PT, DPT 08/03/2021, 12:14 PM

## 2021-08-04 ENCOUNTER — Encounter: Payer: Self-pay | Admitting: Orthopaedic Surgery

## 2021-08-04 ENCOUNTER — Encounter: Payer: Self-pay | Admitting: Physical Therapy

## 2021-08-04 ENCOUNTER — Ambulatory Visit (INDEPENDENT_AMBULATORY_CARE_PROVIDER_SITE_OTHER): Payer: BC Managed Care – PPO | Admitting: Orthopaedic Surgery

## 2021-08-04 ENCOUNTER — Ambulatory Visit (INDEPENDENT_AMBULATORY_CARE_PROVIDER_SITE_OTHER): Payer: BC Managed Care – PPO | Admitting: Physical Therapy

## 2021-08-04 DIAGNOSIS — M25662 Stiffness of left knee, not elsewhere classified: Secondary | ICD-10-CM | POA: Diagnosis not present

## 2021-08-04 DIAGNOSIS — M25562 Pain in left knee: Secondary | ICD-10-CM

## 2021-08-04 DIAGNOSIS — Z96652 Presence of left artificial knee joint: Secondary | ICD-10-CM

## 2021-08-04 DIAGNOSIS — R600 Localized edema: Secondary | ICD-10-CM

## 2021-08-04 DIAGNOSIS — M6281 Muscle weakness (generalized): Secondary | ICD-10-CM | POA: Diagnosis not present

## 2021-08-04 DIAGNOSIS — R262 Difficulty in walking, not elsewhere classified: Secondary | ICD-10-CM

## 2021-08-04 MED ORDER — GABAPENTIN 300 MG PO CAPS: 300.0000 mg | ORAL_CAPSULE | Freq: Every day | ORAL | 1 refills | Status: DC

## 2021-08-04 MED ORDER — OXYCODONE HCL 5 MG PO TABS
5.0000 mg | ORAL_TABLET | Freq: Four times a day (QID) | ORAL | 0 refills | Status: DC | PRN
Start: 2021-08-04 — End: 2021-08-16

## 2021-08-04 MED ORDER — PROMETHAZINE HCL 12.5 MG PO TABS: 12.5000 mg | ORAL_TABLET | Freq: Three times a day (TID) | ORAL | 0 refills | Status: DC | PRN

## 2021-08-04 NOTE — Progress Notes (Signed)
The patient is 2 weeks tomorrow status post a left total knee arthroplasty.  She is 61 years old.  She is ambulate with a walker.  She has had a hard time with pain medication since it did cause a significant mount of nausea with her.  Her left knee incision looks good.  The staples have been removed and Steri-Strips applied.  Her calf is soft.  She has almost full extension but I could only really flex her to about 60 to 65 degrees.  I do feel it is worth trying a different pain medication for her.  She is very nauseous with oxycodone in the hospital but I think she may tolerate it better now.  I will send in some oxycodone as well as Neurontin to take at bedtime 300 mg.  We will also try Phenergan as a nausea medicine.  She will continue aggressive outpatient therapy and I would like to see her back in 4 weeks for repeat exam but no x-rays are needed.

## 2021-08-04 NOTE — Therapy (Signed)
OUTPATIENT PHYSICAL THERAPY TREATMENT NOTE   Patient Name: Maria Becker MRN: [id number] DOB:[birth year], 61 y.o., female Today's Date: 08/04/2021  PCP: Celene Squibb, MD REFERRING PROVIDER: Jean Rosenthal MD  END OF SESSION:   PT End of Session - 08/04/21 1146     Visit Number 5    Number of Visits 26    Date for PT Re-Evaluation 10/21/21    Authorization Type BCBS    Authorization Time Period 25% COINSURANCE  Deductible ($2,000.00)  Deductible has been met  Out-of-Pocket Limit ($8,700.00) $6,295.64 Paid  $2,404.36 to go    PT Start Time 1143    PT Stop Time 1234    PT Time Calculation (min) 51 min    Equipment Utilized During Treatment --    Activity Tolerance Patient tolerated treatment well;Patient limited by pain    Behavior During Therapy [facility] for tasks assessed/performed               Past Medical History:  Diagnosis Date   Arthritis    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 cath 07/2015: 30-50% mid-LAD stenosis but otherwise normal cors   CHF (congestive heart failure) (HCC)    Dyspnea    SOME SOB WITH EXERTION   Family history of adverse reaction to anesthesia    MOTHER SLOW TO WAKE UP   Fibromyalgia    GERD (gastroesophageal reflux disease)    Headache    History of kidney stones    Hyperlipidemia    a. not currently on statin therapy   Hypothyroidism    Insomnia    Pneumonia    HX OF AS A BABY   Past Surgical History:  Procedure Laterality Date   ABDOMINAL HYSTERECTOMY     BACK SURGERY     CARDIAC CATHETERIZATION N/A 07/20/2015   Procedure: Left Heart Cath and Coronary Angiography;  Surgeon: Belva Crome, MD;  Location: [place] [facility];  Service: Cardiovascular;  Laterality: N/A;   CESAREAN SECTION     L4-L5 fusion     RIGHT/LEFT HEART CATH AND CORONARY ANGIOGRAPHY N/A 04/15/2021   Procedure: RIGHT/LEFT HEART CATH AND CORONARY ANGIOGRAPHY;  Surgeon: Burnell Blanks, MD;  Location: [place] [facility];  Service: Cardiovascular;  Laterality: N/A;   TOTAL KNEE ARTHROPLASTY Left 07/22/2021   Procedure: LEFT TOTAL KNEE ARTHROPLASTY;  Surgeon: [name], MD;  Location: [facility];  Service: Orthopedics;  Laterality: Left;  RFNA   Patient Active Problem List   Diagnosis Date Noted   Status post total left knee replacement 07/22/2021   Unilateral primary osteoarthritis, left knee 06/13/2021   Dyspnea on exertion    Angina decubitus ([place]) 07/20/2015   CAD (coronary artery disease), native coronary artery, LAD on cardiac CT 07/20/2015   HTN (hypertension) 07/20/2015   Family history of premature CAD 07/20/2015   Hyperlipidemia LDL goal <100 07/20/2015   Angina at rest [facility]) 07/20/2015   Abnormal nuclear stress test    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  She  is trying to do more with her LLE rather than using external support    PAIN:  Are you having pain? Yes: NPRS scale: today 3/10  Pain location: Left knee anterior more medial & posterior Pain description: jabbing & throbbing Aggravating factors: bending knee Relieving factors: Advil, yesterday Rx 2x, ice  OBJECTIVE: (objective measures completed at initial evaluation unless otherwise dated)   PATIENT SURVEYS:  07/26/2021: FOTO: 13% (predicted 43%)   POSTURE:  07/26/2021: Forward head and shoulders, increased thoracic kyphosis   PALPATION: 07/26/2021: TTP: around joint line              EDEMA:  07/26/2021: Rt knee  circumference: 42.5 centimeters        Left knee circumference: 50 centimeters     LE ROM:   Active ROM Right 07/26/2021 Left 07/26/2021 Left 08/04/21  Knee flexion 124 50 57 (siting)  Knee extension -2 -10 -17 (seated LAQ)   (Blank rows = not tested)      Passive ROM Left 07/26/21 Left 08/03/21 Left 08/04/21  Knee flexion 58 52 (Seated) 62  (Supine)  Knee extension            -8  0 (supine)      LE MMT:   MMT Right 07/26/2021 Left 07/26/2021  Hip flexion 5/5 5/5  Hip extension      Hip abduction 5/5 5/5  Hip adduction 5/5 5/5  Knee flexion 5/5 2/5  Knee extension 5/5 2/5   (Blank rows = not tested)     FUNCTIONAL TESTS:  07/26/2021: 5 times sit to stand: 47 seconds with RW with UE support   GAIT: 07/26/2021: Distance walked: 50 feet Assistive device utilized: Environmental consultant - 2 wheeled Level of assistance: SBA Comments: step to gait pattern with left knee immobilizer       TODAY'S TREATMENT: 08/04/21 Therex:      Aerobic: NuStep L4 x 8 min     Sitting: Lt hamstring stretch with overpressure at distal thigh 3x30 sec Seated tailgate flexion x 2 min; alt flex/ext; able to toe touch to floor if needed Lt LAQ x 20 reps with 5 sec hold     Supine: AA heel slides x 10 reps  Manual: Gentle PROM into flexion in sitting with pt using percussive device - better tolerance to manual therapy today from last session Modalities: Vaso: Lt knee, mod pressure x 10 min, 34 degrees  08/03/21 Therex:      Aerobic: NuStep L4 x 8 min     Sitting: Seated tailgate flexion x 2 min; alt flex/ext; able to toe touch to floor if needed Lt LAQ x 30 reps with 3 sec hold     Supine: AA heel slides x 10 reps  Manual: STM with percussive device sitting edge of mat working on getting to dangle position.  Able to raise mat table with support under knee progressing to no support and pain minimal.  Pt with improved sitting tolerance today. Trial of PROM in sitting but unable to tolerate much  pressure at this time without compensating Self-Care: Educated on edema management and recommending thigh high compression stockings, use of percussive device at home as she has one; and discussed decreasing reliance on external support (leg lifters, other leg, etc) to move LLE Modalities: Vaso: Lt knee, mod pressure x 10 min, 34 degrees   08/01/21 Attempted seated knee flexion stretch but unable to sit back on table and let leg hang due to pain Supine quad sets 5 sec X  15 Supine heelslides AAROM 5 sec 2X10 Low load long duration stretch with knee on 2 towel rolls 3 min then added towel roll 3 min, then finally progressed to black bolster  X3 min Nu step X 6 min L5 UE/LE Manual therapy: Rt knee PROM to tolerance  07/28/2021: Therapeutic Exercise:  Aerobic: Nustep seat 7 for ROM level 5 with BLEs & BUEs for 8 min with 5 sec hold flexion Supine: quad set with ankle on towel roll 5 sec 10 reps 2 sets.    Heel slides LLE on ball with strap assist 5 sec hold ext & flex 15 reps. Prone:  Seated:heel slides with LLE on pillow case 10 reps 3 sets with 5 sec hold flex & ext.   LAQ with strap assist 10 reps 2 sets.   Standing: Neuromuscular Re-education: Manual Therapy: Therapeutic Activity:  PT instructed in gait with step thru pattern rolling off toe in terminal stance to flex knee and knee ext with initial contact. Pt return demo understanding with slow cautious gait.  Self Care: PT educated verbally on elevation with LEs higher than heart & muscle activity like alphabet with ankle. PT recommended >/= 15 min >/= 2x/day. Pt verbalized understanding.  PT instructed in use of pillows to "tent" sheets off feet and another pillow bw LEs in sidelying. Pt verbalized understanding.  Trigger Point Dry Needling:  Modalities: Vaso left knee medium compression 34* 10 min.    PATIENT EDUCATION:  Education details: PT POC, HEP Person educated: Patient and Spouse Education method: Explanation,  Demonstration, Corporate treasurer cues, Verbal cues, and Handouts Education comprehension: verbalized understanding, returned demonstration, and needs further education     HOME EXERCISE PROGRAM: Access Code: [email or web address] URL: [email or web address] Date: 07/26/2021 Prepared by: Kearney Hard   Exercises - Seated Heel Slide  - 3 x daily - 7 x weekly - 2 sets - 10 reps - Sit to Stand with Counter Support  - 3 x daily - 7 x weekly - 10 reps - Supine Knee Extension Strengthening  - 3 x daily - 7 x weekly - 2 sets - 10 reps - 3 seconds hold - Seated Long Arc Quad  - 3 x daily - 7 x weekly - 2 sets - 10 reps - 2-3 seconds hold - Seated Knee Flexion AAROM  - 3 x daily - 7 x weekly - 10 reps - Seated Hip Adduction Isometrics with Ball  - 3 x daily - 7 x weekly - 10 reps - 5 seconds hold   ASSESSMENT:   CLINICAL IMPRESSION: Pt with improved tolerance to activity today and able to push ROM a little more.  She still significantly limited by pain and swelling, with soft end points noted with flexion ROM.  Will continue to benefit from PT to maximize function.    OBJECTIVE IMPAIRMENTS Abnormal gait, decreased activity tolerance, decreased balance, decreased endurance, decreased mobility, difficulty walking, decreased ROM, decreased strength, increased edema, impaired flexibility, obesity, and pain.    ACTIVITY LIMITATIONS cleaning, community activity, and occupation.    PERSONAL FACTORS 3+ comorbidities: CAD, CHF, arthritis, dyspnea, GERD, HA, kidney stones, h/o penumonia, hyperlipidemia  are also affecting patient's functional outcome.      REHAB POTENTIAL: Excellent   CLINICAL DECISION MAKING: Stable/uncomplicated   EVALUATION COMPLEXITY: Low     GOALS: Goals reviewed with patient? Yes Short term PT Goals (target date for Short term goals are 4 weeks 08/26/2021) Patient will demonstrate independent use of initial home exercise program to maintain progress from in  clinic  treatments. Goal status: New   Long term PT goals (target dates for all long term goals are 12 weeks  10/21/2021) Patient will demonstrate/report pain at worst less than or equal to 2/10 to facilitate minimal limitation in daily activity secondary to pain symptoms. Goal status: New   Patient will demonstrate independent use of home exercise program to facilitate ability to maintain/progress functional gains from skilled physical therapy services. Goal status: New   Patient will demonstrate FOTO outcome > or = 43 % to indicate reduced disability due to condition. Goal status: New   Pt will be able to amb with LRAD on community based surfaces safely >/= 1500 feet. Goal status: New       5.  Pt will be able to navigate stairs with single hand rail with step over step gait pattern with pain </= 2/10 in left knee.   Goal status: New       PLAN: PT FREQUENCY:  3x/ week for first 2 weeks and then drop 2x/ week for 12 weeks total.    PT DURATION: 12 weeks   PLANNED INTERVENTIONS: Therapeutic exercises, Therapeutic activity, Neuromuscular re-education, Balance training, Gait training, Patient/Family education, Joint mobilization, Stair training, Dry Needling, Electrical stimulation, Cryotherapy, Moist heat, scar mobilization, Taping, Vasopneumatic device, and Manual therapy   PLAN FOR NEXT SESSION: continue ROM focus at this time, NuStep, strategies for edema/motion,  manual therapy & there ex for knee ROM, quad strengthening, add basic standing balance, gait training, vasopneumatic   Faustino Congress, PT, DPT 08/04/2021, 12:28 PM

## 2021-08-09 ENCOUNTER — Encounter: Payer: Self-pay | Admitting: Physical Therapy

## 2021-08-09 ENCOUNTER — Ambulatory Visit (INDEPENDENT_AMBULATORY_CARE_PROVIDER_SITE_OTHER): Payer: BC Managed Care – PPO | Admitting: Physical Therapy

## 2021-08-09 DIAGNOSIS — M25562 Pain in left knee: Secondary | ICD-10-CM | POA: Diagnosis not present

## 2021-08-09 DIAGNOSIS — M6281 Muscle weakness (generalized): Secondary | ICD-10-CM

## 2021-08-09 DIAGNOSIS — R600 Localized edema: Secondary | ICD-10-CM

## 2021-08-09 DIAGNOSIS — R262 Difficulty in walking, not elsewhere classified: Secondary | ICD-10-CM

## 2021-08-09 DIAGNOSIS — M25662 Stiffness of left knee, not elsewhere classified: Secondary | ICD-10-CM | POA: Diagnosis not present

## 2021-08-09 NOTE — Therapy (Signed)
OUTPATIENT PHYSICAL THERAPY TREATMENT NOTE   Patient Name: Maria Becker MRN: [id number] DOB:[birth year], 61 y.o., female Today's Date: 08/09/2021  PCP: Celene Squibb, MD REFERRING PROVIDER: Jean Rosenthal MD  END OF SESSION:   PT End of Session - 08/09/21 1138     Visit Number 6    Number of Visits 26    Date for PT Re-Evaluation 10/21/21    Authorization Type BCBS    Authorization Time Period 25% COINSURANCE  Deductible ($2,000.00)  Deductible has been met  Out-of-Pocket Limit ($8,700.00) $6,295.64 Paid  $2,404.36 to go    PT Start Time 1138    PT Stop Time 1233    PT Time Calculation (min) 55 min    Activity Tolerance Patient tolerated treatment well;Patient limited by pain    Behavior During Therapy [facility] for tasks assessed/performed                Past Medical History:  Diagnosis Date   Arthritis    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 cath 07/2015: 30-50% mid-LAD stenosis but otherwise normal cors   CHF (congestive heart failure) (HCC)    Dyspnea    SOME SOB WITH EXERTION   Family history of adverse reaction to anesthesia    MOTHER SLOW TO WAKE UP   Fibromyalgia    GERD (gastroesophageal reflux disease)    Headache    History of kidney stones    Hyperlipidemia    a. not currently on statin therapy   Hypothyroidism    Insomnia    Pneumonia    HX OF AS A BABY   Past Surgical History:  Procedure Laterality Date   ABDOMINAL HYSTERECTOMY     BACK SURGERY     CARDIAC CATHETERIZATION N/A 07/20/2015   Procedure: Left Heart Cath and Coronary Angiography;  Surgeon: Belva Crome, MD;  Location: [place] [facility];  Service: Cardiovascular;  Laterality: N/A;   CESAREAN SECTION     L4-L5 fusion     RIGHT/LEFT HEART CATH AND CORONARY ANGIOGRAPHY N/A 04/15/2021   Procedure: RIGHT/LEFT HEART CATH AND CORONARY ANGIOGRAPHY;  Surgeon: Burnell Blanks, MD;  Location: [place] [facility];  Service: Cardiovascular;   Laterality: N/A;   TOTAL KNEE ARTHROPLASTY Left 07/22/2021   Procedure: LEFT TOTAL KNEE ARTHROPLASTY;  Surgeon: [name], MD;  Location: [facility];  Service: Orthopedics;  Laterality: Left;  RFNA   Patient Active Problem List   Diagnosis Date Noted   Status post total left knee replacement 07/22/2021   Unilateral primary osteoarthritis, left knee 06/13/2021   Dyspnea on exertion    Angina decubitus ([place]) 07/20/2015   CAD (coronary artery disease), native coronary artery, LAD on cardiac CT 07/20/2015   HTN (hypertension) 07/20/2015   Family history of premature CAD 07/20/2015   Hyperlipidemia LDL goal <100 07/20/2015   Ang[facility] at rest [facility]) 07/20/2015   Abnormal nuclear stress test    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 Yes: NPRS scale: today 3/10  Pain location: Left knee anterior more medial & posterior Pain description: jabbing & throbbing Aggravating factors: bending knee Relieving factors: Advil, yesterday Rx 2x, ice  OBJECTIVE: (objective measures completed at initial evaluation unless otherwise dated)   PATIENT SURVEYS:  07/26/2021: FOTO: 13% (predicted 43%)   POSTURE:  07/26/2021: Forward head and shoulders, increased thoracic kyphosis   PALPATION: 07/26/2021: TTP: around joint line              EDEMA:  07/26/2021: Rt knee circumference: 42.5 centimeters        Left knee circumference: 50 centimeters     LE ROM:    Active ROM Right 07/26/2021 Left 07/26/2021 Left 08/04/21 Left 08/09/21  Knee flexion 124 50 57 (siting) 63 (sitting)  Knee extension -2 -10 -17 (seated LAQ) -12 (seated LAQ)   (Blank rows = not tested)      Passive ROM Left 07/26/21 Left 08/03/21 Left 08/04/21  Knee flexion 58 52 (Seated) 62  (Supine)  Knee extension            -8  0 (supine)      LE MMT:   MMT Right 07/26/2021 Left 07/26/2021  Hip flexion 5/5 5/5  Hip extension      Hip abduction 5/5 5/5  Hip adduction 5/5 5/5  Knee flexion 5/5 2/5  Knee extension 5/5 2/5   (Blank rows = not tested)     FUNCTIONAL TESTS:  07/26/2021: 5 times sit to stand: 47 seconds with RW with UE support   GAIT: 07/26/2021: Distance walked: 50 feet Assistive device utilized: Environmental consultant - 2 wheeled Level of assistance: SBA Comments: step to gait pattern with left knee immobilizer       TODAY'S TREATMENT: 08/09/21 Therex:      Aerobic: NuStep L3 x 8 min      Sitting: AA knee flexion with RLE overpressure; 10 x 10 sec  Seated tailgate flexion x 2 min; alt flex/ext; able to toe touch to floor if needed Lt LAQ x 30 reps with 5 sec hold     Supine: AA heel slides x 10 reps; with strap and hips in 90 deg flexion Use of gait belt for LLLD stretch - instructed for home up to 30 min  Manual: Gentle PROM into flexion in sitting; contract relax and RLE in chair to decrease hip hike  Modalities: Vaso: Lt knee, mod pressure x 10 min, 34 degrees  08/04/21 Therex:      Aerobic: NuStep L4 x 8 min     Sitting: Lt hamstring stretch with overpressure at distal thigh 3x30 sec Seated tailgate flexion x 2 min; alt flex/ext; able to toe touch to floor if needed Lt LAQ x 20 reps with 5 sec hold     Supine: AA heel slides x 10 reps  Manual: Gentle PROM into flexion in sitting with pt using percussive device - better tolerance to manual therapy today from last session Modalities: Vaso: Lt knee, mod pressure x 10 min, 34  degrees  08/03/21 Therex:      Aerobic: NuStep L4 x 8 min     Sitting: Seated tailgate flexion x 2 min; alt flex/ext; able to toe touch to floor if needed Lt LAQ x 30 reps with 3 sec hold     Supine: AA heel slides x 10 reps  Manual: STM with percussive device sitting edge of mat working on getting to dangle position.  Able to raise mat table with  support under knee progressing to no support and pain minimal.  Pt with improved sitting tolerance today. Trial of PROM in sitting but unable to tolerate much pressure at this time without compensating Self-Care: Educated on edema management and recommending thigh high compression stockings, use of percussive device at home as she has one; and discussed decreasing reliance on external support (leg lifters, other leg, etc) to move LLE Modalities: Vaso: Lt knee, mod pressure x 10 min, 34 degrees   08/01/21 Attempted seated knee flexion stretch but unable to sit back on table and let leg hang due to pain Supine quad sets 5 sec X 15 Supine heelslides AAROM 5 sec 2X10 Low load long duration stretch with knee on 2 towel rolls 3 min then added towel roll 3 min, then finally progressed to black bolster  X3 min Nu step X 6 min L5 UE/LE Manual therapy: Rt knee PROM to tolerance  07/28/2021: Therapeutic Exercise:  Aerobic: Nustep seat 7 for ROM level 5 with BLEs & BUEs for 8 min with 5 sec hold flexion Supine: quad set with ankle on towel roll 5 sec 10 reps 2 sets.    Heel slides LLE on ball with strap assist 5 sec hold ext & flex 15 reps. Prone:  Seated:heel slides with LLE on pillow case 10 reps 3 sets with 5 sec hold flex & ext.   LAQ with strap assist 10 reps 2 sets.   Standing: Neuromuscular Re-education: Manual Therapy: Therapeutic Activity:  PT instructed in gait with step thru pattern rolling off toe in terminal stance to flex knee and knee ext with initial contact. Pt return demo understanding with slow cautious gait.  Self Care: PT  educated verbally on elevation with LEs higher than heart & muscle activity like alphabet with ankle. PT recommended >/= 15 min >/= 2x/day. Pt verbalized understanding.  PT instructed in use of pillows to "tent" sheets off feet and another pillow bw LEs in sidelying. Pt verbalized understanding.  Trigger Point Dry Needling:  Modalities: Vaso left knee medium compression 34* 10 min.    PATIENT EDUCATION:  Education details: PT POC, HEP Person educated: Patient and Spouse Education method: Explanation, Demonstration, Corporate treasurer cues, Verbal cues, and Handouts Education comprehension: verbalized understanding, returned demonstration, and needs further education     HOME EXERCISE PROGRAM: Access Code: [email or web address] URL: [email or web address] Date: 07/26/2021 Prepared by: Kearney Hard   Exercises - Seated Heel Slide  - 3 x daily - 7 x weekly - 2 sets - 10 reps - Sit to Stand with Counter Support  - 3 x daily - 7 x weekly - 10 reps - Supine Knee Extension Strengthening  - 3 x daily - 7 x weekly - 2 sets - 10 reps - 3 seconds hold - Seated Long Arc Quad  - 3 x daily - 7 x weekly - 2 sets - 10 reps - 2-3 seconds hold - Seated Knee Flexion AAROM  - 3 x daily - 7 x weekly - 10 reps - Seated Hip Adduction Isometrics with Ball  - 3 x daily - 7 x weekly - 10 reps - 5 seconds hold   ASSESSMENT:   CLINICAL IMPRESSION: Pt with improved active flexion and extension today demonstrating slow and steady progress with PT.  Still limited by pain and swelling at this time.  Will continue to benefit from PT to maximize function.    OBJECTIVE IMPAIRMENTS Abnormal gait, decreased activity tolerance, decreased balance, decreased endurance, decreased mobility, difficulty walking, decreased ROM, decreased  strength, increased edema, impaired flexibility, obesity, and pain.    ACTIVITY LIMITATIONS cleaning, community activity, and occupation.    PERSONAL FACTORS 3+ comorbidities: CAD, CHF, arthritis,  dyspnea, GERD, HA, kidney stones, h/o penumonia, hyperlipidemia  are also affecting patient's functional outcome.      REHAB POTENTIAL: Excellent   CLINICAL DECISION MAKING: Stable/uncomplicated   EVALUATION COMPLEXITY: Low     GOALS: Goals reviewed with patient? Yes Short term PT Goals (target date for Short term goals are 4 weeks 08/26/2021) Patient will demonstrate independent use of initial home exercise program to maintain progress from in clinic treatments. Goal status: New   Long term PT goals (target dates for all long term goals are 12 weeks  10/21/2021) Patient will demonstrate/report pain at worst less than or equal to 2/10 to facilitate minimal limitation in daily activity secondary to pain symptoms. Goal status: New   Patient will demonstrate independent use of home exercise program to facilitate ability to maintain/progress functional gains from skilled physical therapy services. Goal status: New   Patient will demonstrate FOTO outcome > or = 43 % to indicate reduced disability due to condition. Goal status: New   Pt will be able to amb with LRAD on community based surfaces safely >/= 1500 feet. Goal status: New       5.  Pt will be able to navigate stairs with single hand rail with step over step gait pattern with pain </= 2/10 in left knee.   Goal status: New       PLAN: PT FREQUENCY:  3x/ week for first 2 weeks and then drop 2x/ week for 12 weeks total.    PT DURATION: 12 weeks   PLANNED INTERVENTIONS: Therapeutic exercises, Therapeutic activity, Neuromuscular re-education, Balance training, Gait training, Patient/Family education, Joint mobilization, Stair training, Dry Needling, Electrical stimulation, Cryotherapy, Moist heat, scar mobilization, Taping, Vasopneumatic device, and Manual therapy   PLAN FOR NEXT SESSION: review initial HEP and assess STG,  continue ROM focus at this time, NuStep, strategies for edema/motion,  manual therapy & there ex for knee  ROM, quad strengthening, add basic standing balance, gait training, vasopneumatic   Faustino Congress, PT, DPT 08/09/2021, 12:49 PM

## 2021-08-11 ENCOUNTER — Encounter: Payer: Self-pay | Admitting: Physical Therapy

## 2021-08-11 ENCOUNTER — Ambulatory Visit (INDEPENDENT_AMBULATORY_CARE_PROVIDER_SITE_OTHER): Payer: BC Managed Care – PPO | Admitting: Physical Therapy

## 2021-08-11 DIAGNOSIS — M25662 Stiffness of left knee, not elsewhere classified: Secondary | ICD-10-CM

## 2021-08-11 DIAGNOSIS — M25562 Pain in left knee: Secondary | ICD-10-CM

## 2021-08-11 DIAGNOSIS — M6281 Muscle weakness (generalized): Secondary | ICD-10-CM | POA: Diagnosis not present

## 2021-08-11 DIAGNOSIS — R600 Localized edema: Secondary | ICD-10-CM

## 2021-08-11 DIAGNOSIS — R262 Difficulty in walking, not elsewhere classified: Secondary | ICD-10-CM

## 2021-08-11 NOTE — Therapy (Signed)
OUTPATIENT PHYSICAL THERAPY TREATMENT NOTE   Patient Name: Maria Becker MRN: [id number] DOB:08/31/1960, 61 y.o., female Today's Date: 08/11/2021  PCP: Celene Squibb, MD REFERRING PROVIDER: Jean Rosenthal MD  END OF SESSION:   PT End of Session - 08/11/21 1131     Visit Number 7    Number of Visits 26    Date for PT Re-Evaluation 10/21/21    Authorization Type BCBS    Authorization Time Period 25% COINSURANCE  Deductible ($2,000.00)  Deductible has been met  Out-of-Pocket Limit ($8,700.00) $6,295.64 Paid  $2,404.36 to go    PT Start Time 1131    PT Stop Time 1222    PT Time Calculation (min) 51 min    Activity Tolerance Patient tolerated treatment well;Patient limited by pain    Behavior During Therapy [facility] for tasks assessed/performed                 Past Medical History:  Diagnosis Date   Arthritis    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 cath 07/2015: 30-50% mid-LAD stenosis but otherwise normal cors   CHF (congestive heart failure) (HCC)    Dyspnea    SOME SOB WITH EXERTION   Family history of adverse reaction to anesthesia    MOTHER SLOW TO WAKE UP   Fibromyalgia    GERD (gastroesophageal reflux disease)    Headache    History of kidney stones    Hyperlipidemia    a. not currently on statin therapy   Hypothyroidism    Insomnia    Pneumonia    HX OF AS A BABY   Past Surgical History:  Procedure Laterality Date   ABDOMINAL HYSTERECTOMY     BACK SURGERY     CARDIAC CATHETERIZATION N/A 07/20/2015   Procedure: Left Heart Cath and Coronary Angiography;  Surgeon: Belva Crome, MD;  Location: [place] [facility];  Service: Cardiovascular;  Laterality: N/A;   CESAREAN SECTION     L4-L5 fusion     RIGHT/LEFT HEART CATH AND CORONARY ANGIOGRAPHY N/A 04/15/2021   Procedure: RIGHT/LEFT HEART CATH AND CORONARY ANGIOGRAPHY;  Surgeon: Burnell Blanks, MD;  Location: [place] [facility];  Service: Cardiovascular;   Laterality: N/A;   TOTAL KNEE ARTHROPLASTY Left 07/22/2021   Procedure: LEFT TOTAL KNEE ARTHROPLASTY;  Surgeon: [name], MD;  Location: [facility];  Service: Orthopedics;  Laterality: Left;  RFNA   Patient Active Problem List   Diagnosis Date Noted   Status post total left knee replacement 07/22/2021   Unilateral primary osteoarthritis, left knee 06/13/2021   Dyspnea on exertion    Angina decubitus ([place]) 07/20/2015   CAD (coronary artery disease), native coronary artery, LAD on cardiac CT 07/20/2015   HTN (hypertension) 07/20/2015   Family history of premature CAD 07/20/2015   Hyperlipidemia LDL goal <100 07/20/2015   Angina at rest [facility]) 07/20/2015   Abnormal nuclear stress test    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 Yes: NPRS scale: today 6/10  Pain location: Left knee anterior more medial & posterior Pain description: jabbing & throbbing Aggravating factors: bending knee Relieving factors: Advil, yesterday Rx 2x, ice  OBJECTIVE: (objective measures completed at initial evaluation unless otherwise dated)   PATIENT SURVEYS:  07/26/2021: FOTO: 13% (predicted 43%)   POSTURE:  07/26/2021: Forward head and shoulders, increased thoracic kyphosis   PALPATION: 07/26/2021: TTP: around joint line              EDEMA:  07/26/2021: Rt knee circumference: 42.5 centimeters        Left knee circumference: 50 centimeters     LE ROM:    Active ROM Right 07/26/2021 Left 07/26/2021 Left 08/04/21 Left 08/09/21  Knee flexion 124 50 57 (siting) 63 (sitting)  Knee extension -2 -10 -17 (seated LAQ) -12 (seated LAQ)   (Blank rows = not tested)      Passive ROM Left 07/26/21 Left 08/03/21 Left 08/04/21  Knee flexion 58 52 (Seated) 62  (Supine)  Knee extension            -8  0 (supine)      LE MMT:   MMT Right 07/26/2021 Left 07/26/2021  Hip flexion 5/5 5/5  Hip extension      Hip abduction 5/5 5/5  Hip adduction 5/5 5/5  Knee flexion 5/5 2/5  Knee extension 5/5 2/5   (Blank rows = not tested)     FUNCTIONAL TESTS:  07/26/2021: 5 times sit to stand: 47 seconds with RW with UE support   GAIT: 07/26/2021: Distance walked: 50 feet Assistive device utilized: Environmental consultant - 2 wheeled Level of assistance: SBA Comments: step to gait pattern with left knee immobilizer       TODAY'S TREATMENT: 08/11/21 Therex:      Aerobic: NuStep L4 x 8 min     Standing: Lt foot on 8" step leaning into flexion 10 x 10 sec hold Lt foot on 8" step hamstring stretch working on extension 3x30 sec; overpressure at distal thigh Calf raises 3x10 Knee flexion 2x10 reps with UE support Hip abduction 2x10 with UE support Hip extension 2x10 with UE support     Sitting: AA knee flexion with RLE overpressure; 10 x 10 sec  Gait: Amb in // bars with single side to initiate amb with SPC, then amb 10'x2 without holding on with minguard A Amb with SPC 100' with minguard A and min cues for sequencing - feel pt safe to amb household distances with supervision of husband with SPC at this time  Modalities: Vaso: Lt knee, mod pressure x 10 min, 34 degrees:   08/09/21 Therex:      Aerobic: NuStep L3 x 8 min      Sitting: AA knee flexion with RLE overpressure; 10 x 10 sec  Seated tailgate flexion x 2 min; alt flex/ext; able to toe touch to floor if needed Lt LAQ x 30 reps with 5 sec hold     Supine: AA heel slides x 10 reps; with strap and hips in  90 deg flexion Use of gait belt for LLLD stretch - instructed for home up to 30 min  Manual: Gentle PROM into flexion in sitting; contract relax and RLE in chair to decrease hip hike  Modalities: Vaso: Lt knee, mod pressure x 10 min, 34 degrees  08/04/21 Therex:      Aerobic: NuStep L4 x 8 min     Sitting: Lt hamstring stretch with overpressure at distal thigh 3x30 sec  Seated tailgate flexion x 2 min; alt flex/ext; able to toe touch to floor if needed Lt LAQ x 20 reps with 5 sec hold     Supine: AA heel slides x 10 reps  Manual: Gentle PROM into flexion in sitting with pt using percussive device - better tolerance to manual therapy today from last session Modalities: Vaso: Lt knee, mod pressure x 10 min, 34 degrees      PATIENT EDUCATION:  Education details: PT POC, HEP Person educated: Patient and Spouse Education method: Explanation, Demonstration, Corporate treasurer cues, Verbal cues, and Handouts Education comprehension: verbalized understanding, returned demonstration, and needs further education     HOME EXERCISE PROGRAM: Access Code: [email or web address] URL: [email or web address] Date: 07/26/2021 Prepared by: Kearney Hard   Exercises - Seated Heel Slide  - 3 x daily - 7 x weekly - 2 sets - 10 reps - Sit to Stand with Counter Support  - 3 x daily - 7 x weekly - 10 reps - Supine Knee Extension Strengthening  - 3 x daily - 7 x weekly - 2 sets - 10 reps - 3 seconds hold - Seated Long Arc Quad  - 3 x daily - 7 x weekly - 2 sets - 10 reps - 2-3 seconds hold - Seated Knee Flexion AAROM  - 3 x daily - 7 x weekly - 10 reps - Seated Hip Adduction Isometrics with Ball  - 3 x daily - 7 x weekly - 10 reps - 5 seconds hold   ASSESSMENT:   CLINICAL IMPRESSION: Able to progress amb to [facility] today with minguard A and feel she can initiate this at home.  Will continue to benefit from PT to maximize function.  Flexion still significantly limited at this time.   OBJECTIVE IMPAIRMENTS  Abnormal gait, decreased activity tolerance, decreased balance, decreased endurance, decreased mobility, difficulty walking, decreased ROM, decreased strength, increased edema, impaired flexibility, obesity, and pain.    ACTIVITY LIMITATIONS cleaning, community activity, and occupation.    PERSONAL FACTORS 3+ comorbidities: CAD, CHF, arthritis, dyspnea, GERD, HA, kidney stones, h/o penumonia, hyperlipidemia  are also affecting patient's functional outcome.      REHAB POTENTIAL: Excellent   CLINICAL DECISION MAKING: Stable/uncomplicated   EVALUATION COMPLEXITY: Low     GOALS: Goals reviewed with patient? Yes Short term PT Goals (target date for Short term goals are 4 weeks 08/26/2021) Patient will demonstrate independent use of initial home exercise program to maintain progress from in clinic treatments. Goal status: New   Long term PT goals (target dates for all long term goals are 12 weeks  10/21/2021) Patient will demonstrate/report pain at worst less than or equal to 2/10 to facilitate minimal limitation in daily activity secondary to pain symptoms. Goal status: New   Patient will demonstrate independent use of home exercise program to facilitate ability to maintain/progress functional gains from skilled physical therapy services. Goal status: New   Patient will demonstrate FOTO outcome > or = 43 % to indicate reduced disability due to condition. Goal status: New   Pt will be able to amb with LRAD on community based surfaces safely >/= 1500 feet. Goal status: New       5.  Pt will be able to navigate stairs with single hand rail with step over step gait pattern with pain </= 2/10 in left knee.   Goal status: New       PLAN: PT FREQUENCY:  3x/ week for first 2 weeks and then drop 2x/ week for 12 weeks total.  PT DURATION: 12 weeks   PLANNED INTERVENTIONS: Therapeutic exercises, Therapeutic activity, Neuromuscular re-education, Balance training, Gait training,  Patient/Family education, Joint mobilization, Stair training, Dry Needling, Electrical stimulation, Cryotherapy, Moist heat, scar mobilization, Taping, Vasopneumatic device, and Manual therapy   PLAN FOR NEXT SESSION: review initial HEP and assess STG,  continue ROM focus at this time, NuStep, strategies for edema/motion,  manual therapy & there ex for knee ROM, quad strengthening, standing exercises as tolerated, continue gait with SPC   Faustino Congress, PT, DPT 08/11/2021, 12:30 PM

## 2021-08-12 DIAGNOSIS — Z0001 Encounter for general adult medical examination with abnormal findings: Secondary | ICD-10-CM | POA: Diagnosis not present

## 2021-08-15 ENCOUNTER — Ambulatory Visit (INDEPENDENT_AMBULATORY_CARE_PROVIDER_SITE_OTHER): Payer: BC Managed Care – PPO | Admitting: Physical Therapy

## 2021-08-15 ENCOUNTER — Encounter: Payer: Self-pay | Admitting: Physical Therapy

## 2021-08-15 DIAGNOSIS — M25562 Pain in left knee: Secondary | ICD-10-CM

## 2021-08-15 DIAGNOSIS — M25662 Stiffness of left knee, not elsewhere classified: Secondary | ICD-10-CM

## 2021-08-15 DIAGNOSIS — R600 Localized edema: Secondary | ICD-10-CM

## 2021-08-15 DIAGNOSIS — R262 Difficulty in walking, not elsewhere classified: Secondary | ICD-10-CM

## 2021-08-15 DIAGNOSIS — M6281 Muscle weakness (generalized): Secondary | ICD-10-CM

## 2021-08-15 NOTE — Therapy (Signed)
OUTPATIENT PHYSICAL THERAPY TREATMENT NOTE   Patient Name: Maria Becker MRN: [id number] DOB:Sep 29, 1960, 61 y.o., female Today's Date: 08/15/2021  PCP: Celene Squibb, MD REFERRING PROVIDER: Jean Rosenthal MD  END OF SESSION:   PT End of Session - 08/15/21 1248     Visit Number 8    Number of Visits 26    Date for PT Re-Evaluation 10/21/21    Authorization Type BCBS    Authorization Time Period 25% COINSURANCE  Deductible ($2,000.00)  Deductible has been met  Out-of-Pocket Limit ($8,700.00) $6,295.64 Paid  $2,404.36 to go    PT Start Time 1147    PT Stop Time 1238    PT Time Calculation (min) 51 min    Equipment Utilized During Treatment Gait belt;Left knee immobilizer    Activity Tolerance Patient tolerated treatment well;Patient limited by pain    Behavior During Therapy [facility]FL for tasks assessed/performed                  Past Medical History:  Diagnosis Date   Arthritis    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 cath 07/2015: 30-50% mid-LAD stenosis but otherwise normal cors   CHF (congestive heart failure) (HCC)    Dyspnea    SOME SOB WITH EXERTION   Family history of adverse reaction to anesthesia    MOTHER SLOW TO WAKE UP   Fibromyalgia    GERD (gastroesophageal reflux disease)    Headache    History of kidney stones    Hyperlipidemia    a. not currently on statin therapy   Hypothyroidism    Insomnia    Pneumonia    HX OF AS A BABY   Past Surgical History:  Procedure Laterality Date   ABDOMINAL HYSTERECTOMY     BACK SURGERY     CARDIAC CATHETERIZATION N/A 07/20/2015   Procedure: Left Heart Cath and Coronary Angiography;  Surgeon: Belva Crome, MD;  Location: [place] [facility];  Service: Cardiovascular;  Laterality: N/A;   CESAREAN SECTION     L4-L5 fusion     RIGHT/LEFT HEART CATH AND CORONARY ANGIOGRAPHY N/A 04/15/2021   Procedure: RIGHT/LEFT HEART CATH AND CORONARY ANGIOGRAPHY;  Surgeon: Burnell Blanks, MD;  Location: [place] [facility];  Service: Cardiovascular;  Laterality: N/A;   TOTAL KNEE ARTHROPLASTY Left 07/22/2021   Procedure: LEFT TOTAL KNEE ARTHROPLASTY;  Surgeon: [name], MD;  Location: [facility];  Service: Orthopedics;  Laterality: Left;  RFNA   Patient Active Problem List   Diagnosis Date Noted   Status post total left knee replacement 07/22/2021   Unilateral primary osteoarthritis, left knee 06/13/2021   Dyspnea on exertion    Angina decubitus ([place]) 07/20/2015   CAD (coronary artery disease), native coronary artery, LAD on cardiac CT 07/20/2015   HTN (hypertension) 07/20/2015   Family history of premature CAD 07/20/2015   Hyperlipidemia LDL goal <100 07/20/2015   Angina at rest [facility]) 07/20/2015   Abnormal nuclear stress test    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 Pt frustrated with her progress. Pt still reporting 6/10 pain. Pt still reporting not sleeping due to pain.   PAIN:  Are you having pain? Yes: NPRS scale: today 6/10  Pain location: Left knee anterior more medial & posterior Pain description: jabbing & throbbing Aggravating factors: bending knee Relieving factors: Advil, yesterday Rx 2x, ice, topical rub around knee but not over incision  OBJECTIVE: (objective measures completed at initial evaluation unless otherwise dated)   PATIENT SURVEYS:  07/26/2021: FOTO: 13% (predicted 43%)   POSTURE:  07/26/2021: Forward head and shoulders, increased thoracic  kyphosis   PALPATION: 07/26/2021: TTP: around joint line              EDEMA:  07/26/2021: Rt knee circumference: 42.5 centimeters        Left knee circumference: 50 centimeters     LE ROM:   Active ROM Right 07/26/2021 Left 07/26/2021 Left 08/04/21 Left 08/09/21  Knee flexion 124 50 57 (siting) 63 (sitting)  Knee extension -2 -10 -17 (seated LAQ) -12 (seated LAQ)   (Blank rows = not tested)      Passive ROM Left 07/26/21 Left 08/03/21 Left 08/04/21 Left 08/15/21 supine  Knee flexion 58 52 (Seated) 62  (Supine) 60  Knee extension            -8  0 (supine) -2      LE MMT:   MMT Right 07/26/2021 Left 07/26/2021  Hip flexion 5/5 5/5  Hip extension      Hip abduction 5/5 5/5  Hip adduction 5/5 5/5  Knee flexion 5/5 2/5  Knee extension 5/5 2/5   (Blank rows = not tested)     FUNCTIONAL TESTS:  07/26/2021: 5 times sit to stand: 47 seconds with RW with UE support   GAIT: 07/26/2021: Distance walked: 50 feet Assistive device utilized: Environmental consultant - 2 wheeled Level of assistance: SBA Comments: step to gait pattern with left knee immobilizer       TODAY'S TREATMENT: 08/15/21 Therex:      Aerobic: Scifit recumbent bike: rocking back and forth x 6 minutes     Standing: Lt foot on 8" step leaning into flexion 10 x 10 sec hold Lt foot on 8" step hamstring stretch working on extension x 10 holding 10 secs overpressure at distal thigh Calf raises / Toe raises 3x10 Leg press: seat at 4, 50# bilateral LE's 2 x 15  Leg Press: seat at 4 25# Left LE 2 x 10 Hip abduction 2x10 with UE support Attempted SLR on left: pt unable to lift her leg off mat table SAQ x 10 holding 5 seconds each x 20     Manual PROM in supine knee flexion, hip flexion and knee extension        Gait: Amb with SPC 350' with minguard A focusing on increased left knee heel strike and flexion       Modalities: Vaso: Lt knee, mod pressure x 10 min, 34 degrees:    08/11/21 Therex:      Aerobic: NuStep L4 x 8  min     Standing: Lt foot on 8" step leaning into flexion 10 x 10 sec hold Lt foot on 8" step hamstring stretch working on extension 3x30 sec; overpressure at distal thigh Calf raises 3x10 Knee flexion 2x10 reps with UE support Hip abduction 2x10 with UE support Hip extension 2x10 with UE support     Sitting: AA knee flexion with RLE overpressure; 10 x 10 sec  Gait: Amb in //  bars with single side to initiate amb with SPC, then amb 10'x2 without holding on with minguard A Amb with SPC 100' with minguard A and min cues for sequencing - feel pt safe to amb household distances with supervision of husband with SPC at this time  Modalities: Vaso: Lt knee, mod pressure x 10 min, 34 degrees:   08/09/21 Therex:      Aerobic: NuStep L3 x 8 min      Sitting: AA knee flexion with RLE overpressure; 10 x 10 sec  Seated tailgate flexion x 2 min; alt flex/ext; able to toe touch to floor if needed Lt LAQ x 30 reps with 5 sec hold     Supine: AA heel slides x 10 reps; with strap and hips in 90 deg flexion Use of gait belt for LLLD stretch - instructed for home up to 30 min  Manual: Gentle PROM into flexion in sitting; contract relax and RLE in chair to decrease hip hike  Modalities: Vaso: Lt knee, mod pressure x 10 min, 34 degrees        PATIENT EDUCATION:  Education details: PT POC, HEP Person educated: Patient and Spouse Education method: Explanation, Media planner, Corporate treasurer cues, Verbal cues, and Handouts Education comprehension: verbalized understanding, returned demonstration, and needs further education     HOME EXERCISE PROGRAM: Access Code: [email or web address] URL: [email or web address] Date: 07/26/2021 Prepared by: Kearney Hard   Exercises - Seated Heel Slide  - 3 x daily - 7 x weekly - 2 sets - 10 reps - Sit to Stand with Counter Support  - 3 x daily - 7 x weekly - 10 reps - Supine Knee Extension Strengthening  - 3 x daily - 7 x weekly - 2 sets - 10 reps - 3  seconds hold - Seated Long Arc Quad  - 3 x daily - 7 x weekly - 2 sets - 10 reps - 2-3 seconds hold - Seated Knee Flexion AAROM  - 3 x daily - 7 x weekly - 10 reps - Seated Hip Adduction Isometrics with Ball  - 3 x daily - 7 x weekly - 10 reps - 5 seconds hold   ASSESSMENT:   CLINICAL IMPRESSION: Pt reporting increased pain today with all activities.Pt stating she has been heating her knee. Pt was instructed to hold off on heating and continue with just icing and elevation to help with pain and swelling.  Pt unable to perform a SLR today on her Left LE. Pt with one episode of her left knee buckling during amb with minimal assistance using gait belt. Pt instructed to continue to use her RW until she is gain able to perform at least 10-15 SLR independently. Pt reporting pain in her left hip with tenderness noted in left iliopsoas. Pt was issued standing hip flexor stretch added to pt's HEP. No improvements in ROM this session. Continue skilled PT to maximize function.    OBJECTIVE IMPAIRMENTS Abnormal gait, decreased activity tolerance, decreased balance, decreased endurance, decreased mobility, difficulty walking, decreased ROM, decreased strength, increased edema, impaired flexibility, obesity, and pain.    ACTIVITY LIMITATIONS cleaning, community activity, and occupation.    PERSONAL FACTORS 3+ comorbidities: CAD, CHF, arthritis, dyspnea, GERD, HA, kidney stones, h/o penumonia, hyperlipidemia  are also affecting patient's functional outcome.      REHAB POTENTIAL: Excellent   CLINICAL DECISION MAKING: Stable/uncomplicated   EVALUATION COMPLEXITY: Low     GOALS: Goals reviewed with patient? Yes Short term PT Goals (target date for Short term goals are  4 weeks 08/26/2021) Patient will demonstrate independent use of initial home exercise program to maintain progress from in clinic treatments. Goal status: On-going: 08/15/2021   Long term PT goals (target dates for all long term goals are 12  weeks  10/21/2021) Patient will demonstrate/report pain at worst less than or equal to 2/10 to facilitate minimal limitation in daily activity secondary to pain symptoms. Goal status: New   Patient will demonstrate independent use of home exercise program to facilitate ability to maintain/progress functional gains from skilled physical therapy services. Goal status: New   Patient will demonstrate FOTO outcome > or = 43 % to indicate reduced disability due to condition. Goal status: New   Pt will be able to amb with LRAD on community based surfaces safely >/= 1500 feet. Goal status: New       5.  Pt will be able to navigate stairs with single hand rail with step over step gait pattern with pain </= 2/10 in left knee.   Goal status: New       PLAN: PT FREQUENCY:  3x/ week for first 2 weeks and then drop 2x/ week for 12 weeks total.    PT DURATION: 12 weeks   PLANNED INTERVENTIONS: Therapeutic exercises, Therapeutic activity, Neuromuscular re-education, Balance training, Gait training, Patient/Family education, Joint mobilization, Stair training, Dry Needling, Electrical stimulation, Cryotherapy, Moist heat, scar mobilization, Taping, Vasopneumatic device, and Manual therapy   PLAN FOR NEXT SESSION: review initial HEP and assess STG,  continue ROM focus at this time, NuStep, strategies for edema/motion,  manual therapy & there ex for knee ROM, quad strengthening, standing exercises as tolerated, continue gait with SPC   Oretha Caprice, PT, MPT 08/15/2021, 12:50 PM

## 2021-08-16 ENCOUNTER — Other Ambulatory Visit: Payer: Self-pay | Admitting: Orthopaedic Surgery

## 2021-08-16 ENCOUNTER — Telehealth: Payer: Self-pay | Admitting: Orthopaedic Surgery

## 2021-08-16 ENCOUNTER — Other Ambulatory Visit: Payer: Self-pay

## 2021-08-16 DIAGNOSIS — Z96652 Presence of left artificial knee joint: Secondary | ICD-10-CM

## 2021-08-16 MED ORDER — OXYCODONE HCL 5 MG PO TABS: 5.0000 mg | ORAL_TABLET | Freq: Four times a day (QID) | ORAL | 0 refills | Status: DC | PRN

## 2021-08-16 NOTE — Telephone Encounter (Signed)
I called and talked to the pt. She is having lateral side hip pain, no groin pain, says that is still having swelling in leg. Better that it was after surgery but still swelling. Say foot his numb She is unable to lift leg up anymore. Please advise

## 2021-08-16 NOTE — Telephone Encounter (Signed)
Pt called and states that she is not able to lift her left leg at all and is worried there is something wrong. Can we please call this pt?

## 2021-08-16 NOTE — Telephone Encounter (Signed)
Pt was called and advised and stated understanding, She asked for a refill on her pain medication. Can you please send this?

## 2021-08-17 ENCOUNTER — Encounter: Payer: Self-pay | Admitting: Physical Therapy

## 2021-08-17 ENCOUNTER — Telehealth: Payer: Self-pay

## 2021-08-17 ENCOUNTER — Ambulatory Visit (HOSPITAL_COMMUNITY)
Admission: RE | Admit: 2021-08-17 | Discharge: 2021-08-17 | Disposition: A | Discharge status: Home / Self Care | Payer: BC Managed Care – PPO | Source: Ambulatory Visit | Attending: Orthopaedic Surgery | Admitting: Orthopaedic Surgery

## 2021-08-17 ENCOUNTER — Other Ambulatory Visit: Payer: Self-pay | Admitting: Orthopaedic Surgery

## 2021-08-17 ENCOUNTER — Ambulatory Visit (INDEPENDENT_AMBULATORY_CARE_PROVIDER_SITE_OTHER): Payer: BC Managed Care – PPO | Admitting: Physical Therapy

## 2021-08-17 DIAGNOSIS — M25662 Stiffness of left knee, not elsewhere classified: Secondary | ICD-10-CM

## 2021-08-17 DIAGNOSIS — R600 Localized edema: Secondary | ICD-10-CM | POA: Diagnosis not present

## 2021-08-17 DIAGNOSIS — Z96652 Presence of left artificial knee joint: Secondary | ICD-10-CM | POA: Insufficient documentation

## 2021-08-17 DIAGNOSIS — M25562 Pain in left knee: Secondary | ICD-10-CM | POA: Diagnosis not present

## 2021-08-17 DIAGNOSIS — M7989 Other specified soft tissue disorders: Secondary | ICD-10-CM

## 2021-08-17 DIAGNOSIS — R262 Difficulty in walking, not elsewhere classified: Secondary | ICD-10-CM

## 2021-08-17 DIAGNOSIS — M6281 Muscle weakness (generalized): Secondary | ICD-10-CM

## 2021-08-17 DIAGNOSIS — M79605 Pain in left leg: Secondary | ICD-10-CM

## 2021-08-17 MED ORDER — OXYCODONE HCL 5 MG PO TABS: 5.0000 mg | ORAL_TABLET | Freq: Four times a day (QID) | ORAL | 0 refills | Status: DC | PRN

## 2021-08-17 NOTE — Telephone Encounter (Signed)
Received message from [facility] that the pt's oxy was on back order. Lvm for pt to cb to discuss sending it to a different pharmacy

## 2021-08-17 NOTE — Telephone Encounter (Signed)
Talked to pt. Please resend to the [facility] in [place]

## 2021-08-17 NOTE — Therapy (Signed)
OUTPATIENT PHYSICAL THERAPY TREATMENT NOTE   Patient Name: Maria Becker MRN: [id number] DOB:May 18, 1960, 61 y.o., female Today's Date: 08/17/2021  PCP: Celene Squibb, MD REFERRING PROVIDER: Jean Rosenthal MD  END OF SESSION:   PT End of Session - 08/17/21 1150     Visit Number 9    Number of Visits 26    Date for PT Re-Evaluation 10/21/21    Authorization Type BCBS    Authorization Time Period 25% COINSURANCE  Deductible ($2,000.00)  Deductible has been met  Out-of-Pocket Limit ($8,700.00) $6,295.64 Paid  $2,404.36 to go    PT Start Time 1145    PT Stop Time 1242    PT Time Calculation (min) 57 min    Equipment Utilized During Treatment Gait belt;Left knee immobilizer    Activity Tolerance Patient tolerated treatment well;Patient limited by pain    Behavior During Therapy WFL for tasks assessed/performed                   Past Medical History:  Diagnosis Date   Arthritis    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 cath 07/2015: 30-50% mid-LAD stenosis but otherwise normal cors   CHF (congestive heart failure) (HCC)    Dyspnea    SOME SOB WITH EXERTION   Family history of adverse reaction to anesthesia    MOTHER SLOW TO WAKE UP   Fibromyalgia    GERD (gastroesophageal reflux disease)    Headache    History of kidney stones    Hyperlipidemia    a. not currently on statin therapy   Hypothyroidism    Insomnia    Pneumonia    HX OF AS A BABY   Past Surgical History:  Procedure Laterality Date   ABDOMINAL HYSTERECTOMY     BACK SURGERY     CARDIAC CATHETERIZATION N/A 07/20/2015   Procedure: Left Heart Cath and Coronary Angiography;  Surgeon: Belva Crome, MD;  Location: [facility];  Service: Cardiovascular;  Laterality: N/A;   CESAREAN SECTION     L4-L5 fusion     RIGHT/LEFT HEART CATH AND CORONARY ANGIOGRAPHY N/A 04/15/2021   Procedure: RIGHT/LEFT HEART CATH AND CORONARY ANGIOGRAPHY;  Surgeon: Burnell Blanks, MD;  Location: [facility];  Service: Cardiovascular;  Laterality: N/A;   TOTAL KNEE ARTHROPLASTY Left 07/22/2021   Procedure: LEFT TOTAL KNEE ARTHROPLASTY;  Surgeon: [name], MD;  Location: [facility];  Service: Orthopedics;  Laterality: Left;  RFNA   Patient Active Problem List   Diagnosis Date Noted   Status post total left knee replacement 07/22/2021   Unilateral primary osteoarthritis, left knee 06/13/2021   Dyspnea on exertion    Angina decubitus ([place]) 07/20/2015   CAD (coronary artery disease), native coronary artery, LAD on cardiac CT 07/20/2015   HTN (hypertension) 07/20/2015   Family history of premature CAD 07/20/2015   Hyperlipidemia LDL goal <100 07/20/2015   Angina at rest [facility]) 07/20/2015   Abnormal nuclear stress test    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  She is sleeping for ~3 hrs then up & down rest of night.  She is taking Gabapentin nightly, muscle relaxor & Oxy at bed time & maybe once during day and Advil during day.   PAIN:  Are you having pain? Yes: NPRS scale: today 5/10 and over 5-7 days lowest 3/10 & highest 10/10 Pain location: Left knee anterior more medial & posterior Pain description: jabbing & throbbing Aggravating factors: bending knee Relieving factors: Advil, yesterday Rx 2x, ice, topical rub around knee but not over incision  OBJECTIVE: (objective measures completed at initial evaluation unless otherwise dated)   PATIENT  SURVEYS:  07/26/2021: FOTO: 13% (predicted 43%)   POSTURE:  07/26/2021: Forward head and shoulders, increased thoracic kyphosis   PALPATION: 07/26/2021: TTP: around joint line              EDEMA:  07/26/2021: Rt knee circumference: 42.5 centimeters        Left knee circumference: 50 centimeters     LE ROM:   Active ROM Right 07/26/2021 Left 07/26/2021 Left 08/04/21 Left 08/09/21  Knee flexion 124 50 57 (siting) 63 (sitting)  Knee extension -2 -10 -17 (seated LAQ) -12 (seated LAQ)   (Blank rows = not tested)      Passive ROM Left 07/26/21 Left 08/03/21 Left 08/04/21 Left 08/15/21 supine  Knee flexion 58 52 (Seated) 62  (Supine) 60  Knee extension            -8  0 (supine) -2      LE MMT:   MMT Right 07/26/2021 Left 07/26/2021  Hip flexion 5/5 5/5  Hip extension      Hip abduction 5/5 5/5  Hip adduction 5/5 5/5  Knee flexion 5/5 2/5  Knee extension 5/5 2/5   (Blank rows = not tested)     FUNCTIONAL TESTS:  07/26/2021: 5 times sit to stand: 47 seconds with RW with UE support   GAIT: 07/26/2021: Distance walked: 50 feet Assistive device utilized: Environmental consultant - 2 wheeled Level of assistance: SBA Comments: step to gait pattern with left knee immobilizer       TODAY'S TREATMENT: 08/17/2021 Therex:      Aerobic: Scifit recumbent bike seat 10: rocking back and forth for 5 sec hold x 8 minutes     Standing: Lt foot on 8" step leaning into flexion 10 x 10 sec hold Leg press: 56# bilateral LE's 2 sets x 15 reps 1st set seat at 4 & 2nd set seat 3 Leg Press: seat at 4 25# Left LE 15 reps 1 set RLE abd, flex & ext 5 reps with bar & cane support.   Manual PROM in supine knee flexion, hip flexion and knee extension   Self-Care:  PT educated on proper elevation with recommendation >/= 15 min >/= 2x/day. Pt verbalized understanding.     PT demo & verbal cues on scar mob with approximation & same direction soft tissue. Pt return demo & verbalized understanding. PT demo & verbal  cues on ice massage. Pt return demo & verbalized understanding. PT verbal cues on positioning in bed with pillows to "tent" sheets off feet & between LEs in sidelying. Using step stool for RLE to get on & off high bed. Pt recommended trying during day initially then progress to starting in bed for night.  Continue current pain med use. Pt verbalized understanding.  Therapeutic Activities: Weight shifting onto LLE 2-3 sec hold 3-5 reps upon arising. Weight shift onto LLE initial  contact position with RLE flexing and LLE terminal stance knee flexion with weight shift to RLE 10 reps ea cane & bar support.   Gait: Amb with SPC with min guard A focusing on carryover for weight shift initial contact to midstance with knee ext and terminal stance to swing knee flexion. Pt also worked on form with RW.   Modalities: Vaso: Lt knee, mod pressure x 10 min, 34 degrees:  08/15/21 Therex:      Aerobic: Scifit recumbent bike: rocking back and forth x 6 minutes     Standing: Lt foot on 8" step leaning into flexion 10 x 10 sec hold Lt foot on 8" step hamstring stretch working on extension x 10 holding 10 secs overpressure at distal thigh Calf raises / Toe raises 3x10 Leg press: seat at 4, 50# bilateral LE's 2 x 15  Leg Press: seat at 4 25# Left LE 2 x 10 Hip abduction 2x10 with UE support Attempted SLR on left: pt unable to lift her leg off mat table SAQ x 10 holding 5 seconds each x 20     Manual PROM in supine knee flexion, hip flexion and knee extension        Gait: Amb with SPC 350' with minguard A focusing on increased left knee heel strike and flexion       Modalities: Vaso: Lt knee, mod pressure x 10 min, 34 degrees:    08/11/21 Therex:      Aerobic: NuStep L4 x 8 min     Standing: Lt foot on 8" step leaning into flexion 10 x 10 sec hold Lt foot on 8" step hamstring stretch working on extension 3x30 sec; overpressure at distal thigh Calf raises 3x10 Knee flexion 2x10 reps with UE  support Hip abduction 2x10 with UE support Hip extension 2x10 with UE support     Sitting: AA knee flexion with RLE overpressure; 10 x 10 sec  Gait: Amb in // bars with single side to initiate amb with SPC, then amb 10'x2 without holding on with minguard A Amb with SPC 100' with minguard A and min cues for sequencing - feel pt safe to amb household distances with supervision of husband with SPC at this time  Modalities: Vaso: Lt knee, mod pressure x 10 min, 34 degrees:       PATIENT EDUCATION:  Education details: PT POC, HEP Person educated: Patient and Spouse Education method: Explanation, Media planner, Corporate treasurer cues, Verbal cues, and Handouts Education comprehension: verbalized understanding, returned demonstration, and needs further education     HOME EXERCISE PROGRAM: Access Code: [email or web address] URL: [email or web address] Date: 07/26/2021 Prepared by: Kearney Hard   Exercises - Seated Heel Slide  - 3 x daily - 7 x weekly - 2 sets - 10 reps - Sit to Stand with Counter Support  - 3 x daily - 7 x weekly - 10 reps - Supine Knee Extension Strengthening  - 3 x daily - 7 x weekly - 2 sets - 10 reps - 3 seconds hold - Seated Long Arc Quad  - 3 x daily - 7 x weekly - 2 sets - 10 reps - 2-3 seconds hold - Seated Knee Flexion AAROM  - 3 x daily - 7 x weekly - 10 reps - Seated Hip Adduction Isometrics with Ball  - 3 x daily - 7 x weekly - 10 reps - 5 seconds hold   ASSESSMENT:   CLINICAL IMPRESSION: PT instructed in self-care activities to enable pt some options to address activities  causing pain. She appears to understand including rationale.  She improved gait mechanics with instruction.  She is still cautious with all movements due to pain &/or fear of pain.  She continues to benefit from skilled PT.    OBJECTIVE IMPAIRMENTS Abnormal gait, decreased activity tolerance, decreased balance, decreased endurance, decreased mobility, difficulty walking, decreased ROM,  decreased strength, increased edema, impaired flexibility, obesity, and pain.    ACTIVITY LIMITATIONS cleaning, community activity, and occupation.    PERSONAL FACTORS 3+ comorbidities: CAD, CHF, arthritis, dyspnea, GERD, HA, kidney stones, h/o penumonia, hyperlipidemia  are also affecting patient's functional outcome.      REHAB POTENTIAL: Excellent   CLINICAL DECISION MAKING: Stable/uncomplicated   EVALUATION COMPLEXITY: Low     GOALS: Goals reviewed with patient? Yes Short term PT Goals (target date for Short term goals are 4 weeks 08/26/2021) Patient will demonstrate independent use of initial home exercise program to maintain progress from in clinic treatments. Goal status: On-going: 08/15/2021   Long term PT goals (target dates for all long term goals are 12 weeks  10/21/2021) Patient will demonstrate/report pain at worst less than or equal to 2/10 to facilitate minimal limitation in daily activity secondary to pain symptoms. Goal status: New   Patient will demonstrate independent use of home exercise program to facilitate ability to maintain/progress functional gains from skilled physical therapy services. Goal status: New   Patient will demonstrate FOTO outcome > or = 43 % to indicate reduced disability due to condition. Goal status: New   Pt will be able to amb with LRAD on community based surfaces safely >/= 1500 feet. Goal status: New       5.  Pt will be able to navigate stairs with single hand rail with step over step gait pattern with pain </= 2/10 in left knee.   Goal status: New       PLAN: PT FREQUENCY:  3x/ week for first 2 weeks and then drop 2x/ week for 12 weeks total.    PT DURATION: 12 weeks   PLANNED INTERVENTIONS: Therapeutic exercises, Therapeutic activity, Neuromuscular re-education, Balance training, Gait training, Patient/Family education, Joint mobilization, Stair training, Dry Needling, Electrical stimulation, Cryotherapy, Moist heat, scar  mobilization, Taping, Vasopneumatic device, and Manual therapy   PLAN FOR NEXT SESSION: check if self-care instruction helped pain & ability to sleep,  review initial HEP and assess STG,  continue ROM focus at this time, NuStep, strategies for edema/motion,  manual therapy & there ex for knee ROM, quad strengthening, standing exercises as tolerated, continue gait with SPC   Jamey Reas, PT, MPT 08/17/2021, 12:52 PM

## 2021-08-17 NOTE — Progress Notes (Signed)
Left lower extremity venous duplex has been completed. Preliminary results can be found in CV Proc through chart review.  Results were faxed to Dr. Eliberto Ivory office.  08/17/21 8:07 AM Olen Cordial RVT

## 2021-08-22 ENCOUNTER — Ambulatory Visit (INDEPENDENT_AMBULATORY_CARE_PROVIDER_SITE_OTHER): Payer: BC Managed Care – PPO | Admitting: Physical Therapy

## 2021-08-22 ENCOUNTER — Encounter: Payer: Self-pay | Admitting: Physical Therapy

## 2021-08-22 DIAGNOSIS — R600 Localized edema: Secondary | ICD-10-CM

## 2021-08-22 DIAGNOSIS — M25562 Pain in left knee: Secondary | ICD-10-CM

## 2021-08-22 DIAGNOSIS — M25662 Stiffness of left knee, not elsewhere classified: Secondary | ICD-10-CM | POA: Diagnosis not present

## 2021-08-22 DIAGNOSIS — R262 Difficulty in walking, not elsewhere classified: Secondary | ICD-10-CM

## 2021-08-22 DIAGNOSIS — M6281 Muscle weakness (generalized): Secondary | ICD-10-CM | POA: Diagnosis not present

## 2021-08-22 NOTE — Therapy (Signed)
OUTPATIENT PHYSICAL THERAPY TREATMENT NOTE   Patient Name: Maria Becker MRN: [id number] DOB:12-24-1960, 61 y.o., female Today's Date: 08/22/2021  PCP: Celene Squibb, MD REFERRING PROVIDER: Jean Rosenthal MD  END OF SESSION:   PT End of Session - 08/22/21 1154     Visit Number 10    Number of Visits 26    Date for PT Re-Evaluation 10/21/21    Authorization Type BCBS    Authorization Time Period 25% COINSURANCE  Deductible ($2,000.00)  Deductible has been met  Out-of-Pocket Limit ($8,700.00) $6,295.64 Paid  $2,404.36 to go    PT Start Time 1148    PT Stop Time 1230    PT Time Calculation (min) 42 min    Equipment Utilized During Treatment Gait belt;Left knee immobilizer    Activity Tolerance Patient tolerated treatment well;Patient limited by pain    Behavior During Therapy WFL for tasks assessed/performed                    Past Medical History:  Diagnosis Date   Arthritis    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 cath 07/2015: 30-50% mid-LAD stenosis but otherwise normal cors   CHF (congestive heart failure) (HCC)    Dyspnea    SOME SOB WITH EXERTION   Family history of adverse reaction to anesthesia    MOTHER SLOW TO WAKE UP   Fibromyalgia    GERD (gastroesophageal reflux disease)    Headache    History of kidney stones    Hyperlipidemia    a. not currently on statin therapy   Hypothyroidism    Insomnia    Pneumonia    HX OF AS A BABY   Past Surgical History:  Procedure Laterality Date   ABDOMINAL HYSTERECTOMY     BACK SURGERY     CARDIAC CATHETERIZATION N/A 07/20/2015   Procedure: Left Heart Cath and Coronary Angiography;  Surgeon: Belva Crome, MD;  Location: [place] [facility] [facility];  Service: Cardiovascular;  Laterality: N/A;   CESAREAN SECTION     L4-L5 fusion     RIGHT/LEFT HEART CATH AND CORONARY ANGIOGRAPHY N/A 04/15/2021   Procedure: RIGHT/LEFT HEART CATH AND CORONARY ANGIOGRAPHY;  Surgeon: Burnell Blanks, MD;  Location: [facility];  Service: Cardiovascular;  Laterality: N/A;   TOTAL KNEE ARTHROPLASTY Left 07/22/2021   Procedure: LEFT TOTAL KNEE ARTHROPLASTY;  Surgeon: [name], MD;  Location: [facility];  Service: Orthopedics;  Laterality: Left;  RFNA   Patient Active Problem List   Diagnosis Date Noted   Status post total left knee replacement 07/22/2021   Unilateral primary osteoarthritis, left knee 06/13/2021   Dyspnea on exertion    Angina decubitus ([place]) 07/20/2015   CAD (coronary artery disease), native coronary artery, LAD on cardiac CT 07/20/2015   HTN (hypertension) 07/20/2015   Family history of premature CAD 07/20/2015   Hyperlipidemia LDL goal <100 07/20/2015   Angina at rest [facility]) 07/20/2015   Abnormal nuclear stress test    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 Pt stating she has been using the ice massage method for pain and swelling. Pt stating she if finally able to sleep in her bed and lye on her sided.    PAIN:  Are you having pain?  Pain location: Left knee anterior more medial & posterior Pain description: jabbing & throbbing Aggravating factors: bending knee Relieving factors: Advil, yesterday Rx 2x, ice, topical rub around knee but not over incision  OBJECTIVE: (objective measures completed at initial evaluation unless otherwise dated)   PATIENT SURVEYS:  07/26/2021: FOTO: 13% (predicted 43%) 08/22/2021: FOTO: 39% 10th visit   POSTURE:  07/26/2021: Forward head and  shoulders, increased thoracic kyphosis   PALPATION: 07/26/2021: TTP: around joint line              EDEMA:  07/26/2021: Rt knee circumference: 42.5 centimeters        Left knee circumference: 50 centimeters     LE ROM:   Active ROM Right 07/26/2021 Left 07/26/2021 Left 08/04/21 Left 08/09/21 Left 08/22/21  Knee flexion 124 50 57 (siting) 63 (sitting) 66  Knee extension -2 -10 -17 (seated LAQ) -12 (seated LAQ) -8   (Blank rows = not tested)      Passive ROM Left 07/26/21 Left 08/03/21 Left 08/04/21 Left 08/15/21 supine Left 08/22/21 supine  Knee flexion 58 52 (Seated) 62  (Supine) 60 70  Knee extension            -8  0 (supine) -2 -2      LE MMT:   MMT Right 07/26/2021 Left 07/26/2021  Hip flexion 5/5 5/5  Hip extension      Hip abduction 5/5 5/5  Hip adduction 5/5 5/5  Knee flexion 5/5 2/5  Knee extension 5/5 2/5   (Blank rows = not tested)     FUNCTIONAL TESTS:  07/26/2021: 5 times sit to stand: 47 seconds with RW with UE support   GAIT: 07/26/2021: Distance walked: 50 feet Assistive device utilized: Environmental consultant - 2 wheeled Level of assistance: SBA Comments: step to gait pattern with left knee immobilizer       TODAY'S TREATMENT: 08/22/2021 Therex:      Aerobic: recumbent bike seat 7: rocking back and forth for 5 sec hold x 8 minutes     Standing: Lt foot on 8" step leaning into flexion 10 x 10 sec hold Calf stretch: slant boar Leg press: 75# bilateral LE's 3 x 10,  Leg Press: 50# Left LE only: 2 x10 Step ups on 4 inch step x 10 Left LE single UE hold Sit to stand x 10 from arm chair, therapist foot in front of pt's left foot to prevent knee extension Neuromuscular Re-education:  Tandem stance holding 20 seconds Tandem walking in parallel bars x 6 c finger taps UE support Manual PROM in supine knee flexion, hip flexion and knee extension with overpressure STM: quad muscles around incision site Modalities:  Vasopneumatic: Medium compression, 34 degrees, x 10  minutes   08/17/2021 Therex:      Aerobic: Scifit recumbent bike seat 10: rocking back and forth for 5 sec hold x 8 minutes     Standing: Lt foot on 8" step leaning into flexion 10 x 10 sec hold Leg press: 56# bilateral LE's 2 sets x 15 reps 1st set seat at 4 & 2nd set seat 3 Leg Press: seat at 4 25# Left LE 15 reps 1 set RLE abd, flex & ext 5 reps with bar & cane support.   Manual PROM  in supine knee flexion, hip flexion and knee extension   Self-Care:  PT educated on proper elevation with recommendation >/= 15 min >/= 2x/day. Pt verbalized understanding.     PT demo & verbal cues on scar mob with approximation & same direction soft tissue. Pt return demo & verbalized understanding. PT demo & verbal cues on ice massage. Pt return demo & verbalized understanding. PT verbal cues on positioning in bed with pillows to "tent" sheets off feet & between LEs in sidelying. Using step stool for RLE to get on & off high bed. Pt recommended trying during day initially then progress to starting in bed for night.  Continue current pain med use. Pt verbalized understanding.  Therapeutic Activities: Weight shifting onto LLE 2-3 sec hold 3-5 reps upon arising. Weight shift onto LLE initial contact position with RLE flexing and LLE terminal stance knee flexion with weight shift to RLE 10 reps ea cane & bar support.   Gait: Amb with SPC with min guard A focusing on carryover for weight shift initial contact to midstance with knee ext and terminal stance to swing knee flexion. Pt also worked on form with RW.   Modalities: Vaso: Lt knee, mod pressure x 10 min, 34 degrees:  08/15/21 Therex:      Aerobic: Scifit recumbent bike: rocking back and forth x 6 minutes     Standing: Lt foot on 8" step leaning into flexion 10 x 10 sec hold Lt foot on 8" step hamstring stretch working on extension x 10 holding 10 secs overpressure at distal thigh Calf raises / Toe raises 3x10 Leg press: seat at 4, 50# bilateral  LE's 2 x 15  Leg Press: seat at 4 25# Left LE 2 x 10 Hip abduction 2x10 with UE support Attempted SLR on left: pt unable to lift her leg off mat table SAQ x 10 holding 5 seconds each x 20     Manual PROM in supine knee flexion, hip flexion and knee extension        Gait: Amb with SPC 350' with minguard A focusing on increased left knee heel strike and flexion       Modalities: Vaso: Lt knee, mod pressure x 10 min, 34 degrees:         PATIENT EDUCATION:  Education details: PT POC, HEP Person educated: Patient and Spouse Education method: Explanation, Demonstration, Corporate treasurer cues, Verbal cues, and Handouts Education comprehension: verbalized understanding, returned demonstration, and needs further education     HOME EXERCISE PROGRAM: Access Code: [email or web address] URL: [email or web address] Date: 07/26/2021 Prepared by: Kearney Hard   Exercises - Seated Heel Slide  - 3 x daily - 7 x weekly - 2 sets - 10 reps - Sit to Stand with Counter Support  - 3 x daily - 7 x weekly - 10 reps - Supine Knee Extension Strengthening  - 3 x daily - 7 x weekly - 2 sets - 10 reps - 3 seconds hold - Seated Long Arc Quad  - 3 x daily - 7 x weekly - 2 sets - 10 reps - 2-3 seconds hold - Seated Knee Flexion AAROM  - 3 x daily - 7 x weekly - 10 reps - Seated Hip Adduction Isometrics with Ball  - 3 x daily - 7 x weekly - 10 reps - 5 seconds hold   ASSESSMENT:   CLINICAL IMPRESSION:     OBJECTIVE IMPAIRMENTS Abnormal gait, decreased activity tolerance, decreased balance, decreased endurance, decreased mobility, difficulty walking, decreased ROM, decreased  strength, increased edema, impaired flexibility, obesity, and pain.    ACTIVITY LIMITATIONS cleaning, community activity, and occupation.    PERSONAL FACTORS 3+ comorbidities: CAD, CHF, arthritis, dyspnea, GERD, HA, kidney stones, h/o penumonia, hyperlipidemia  are also affecting patient's functional outcome.      REHAB POTENTIAL:  Excellent   CLINICAL DECISION MAKING: Stable/uncomplicated   EVALUATION COMPLEXITY: Low     GOALS: Goals reviewed with patient? Yes Short term PT Goals (target date for Short term goals are 4 weeks 08/26/2021) Patient will demonstrate independent use of initial home exercise program to maintain progress from in clinic treatments. Goal status: MET 08/22/2021   Long term PT goals (target dates for all long term goals are 12 weeks  10/21/2021) Patient will demonstrate/report pain at worst less than or equal to 2/10 to facilitate minimal limitation in daily activity secondary to pain symptoms. Goal status: New   Patient will demonstrate independent use of home exercise program to facilitate ability to maintain/progress functional gains from skilled physical therapy services. Goal status: New   Patient will demonstrate FOTO outcome > or = 43 % to indicate reduced disability due to condition. Goal status: New   Pt will be able to amb with LRAD on community based surfaces safely >/= 1500 feet. Goal status: New       5.  Pt will be able to navigate stairs with single hand rail with step over step gait pattern with pain </= 2/10 in left knee.   Goal status: New       PLAN: PT FREQUENCY:  3x/ week for first 2 weeks and then drop 2x/ week for 12 weeks total.    PT DURATION: 12 weeks   PLANNED INTERVENTIONS: Therapeutic exercises, Therapeutic activity, Neuromuscular re-education, Balance training, Gait training, Patient/Family education, Joint mobilization, Stair training, Dry Needling, Electrical stimulation, Cryotherapy, Moist heat, scar mobilization, Taping, Vasopneumatic device, and Manual therapy   PLAN FOR NEXT SESSION: bike vs Nustep, strategies for edema/motion,  manual therapy & there ex for knee ROM, quad strengthening, standing exercises as tolerated, continue gait with SPC   Oretha Caprice, PT, MPT 08/22/2021, 12:57 PM

## 2021-08-24 ENCOUNTER — Ambulatory Visit (INDEPENDENT_AMBULATORY_CARE_PROVIDER_SITE_OTHER): Payer: BC Managed Care – PPO | Admitting: Physical Therapy

## 2021-08-24 ENCOUNTER — Encounter: Payer: Self-pay | Admitting: Physical Therapy

## 2021-08-24 DIAGNOSIS — M6281 Muscle weakness (generalized): Secondary | ICD-10-CM | POA: Diagnosis not present

## 2021-08-24 DIAGNOSIS — M25562 Pain in left knee: Secondary | ICD-10-CM | POA: Diagnosis not present

## 2021-08-24 DIAGNOSIS — M25662 Stiffness of left knee, not elsewhere classified: Secondary | ICD-10-CM

## 2021-08-24 DIAGNOSIS — R600 Localized edema: Secondary | ICD-10-CM

## 2021-08-24 DIAGNOSIS — R262 Difficulty in walking, not elsewhere classified: Secondary | ICD-10-CM

## 2021-08-24 NOTE — Therapy (Signed)
OUTPATIENT PHYSICAL THERAPY TREATMENT NOTE   Patient Name: Maria Becker MRN: [id number] DOB:[birth year], 61 y.o., female Today's Date: 08/24/2021  PCP: Celene Squibb, MD REFERRING PROVIDER: Jean Rosenthal MD  END OF SESSION:   PT End of Session - 08/24/21 1153     Visit Number 11    Number of Visits 26    Date for PT Re-Evaluation 10/21/21    Authorization Type BCBS    Authorization Time Period 25% COINSURANCE  Deductible ($2,000.00)  Deductible has been met  Out-of-Pocket Limit ($8,700.00) $6,295.64 Paid  $2,404.36 to go    PT Start Time 1139    PT Stop Time 1231    PT Time Calculation (min) 52 min    Equipment Utilized During Treatment Gait belt;Left knee immobilizer    Activity Tolerance Patient tolerated treatment well;Patient limited by pain    Behavior During Therapy WFL for tasks assessed/performed                     Past Medical History:  Diagnosis Date   Arthritis    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 cath 07/2015: 30-50% mid-LAD stenosis but otherwise normal cors   CHF (congestive heart failure) (HCC)    Dyspnea    SOME SOB WITH EXERTION   Family history of adverse reaction to anesthesia    MOTHER SLOW TO WAKE UP   Fibromyalgia    GERD (gastroesophageal reflux disease)    Headache    History of kidney stones    Hyperlipidemia    a. not currently on statin therapy   Hypothyroidism    Insomnia    Pneumonia    HX OF AS A BABY   Past Surgical History:  Procedure Laterality Date   ABDOMINAL HYSTERECTOMY     BACK SURGERY     CARDIAC CATHETERIZATION N/A 07/20/2015   Procedure: Left Heart Cath and Coronary Angiography;  Surgeon: Belva Crome, MD;  Location: [place] [facility];  Service: Cardiovascular;  Laterality: N/A;   CESAREAN SECTION     L4-L5 fusion     RIGHT/LEFT HEART CATH AND CORONARY ANGIOGRAPHY N/A 04/15/2021   Procedure: RIGHT/LEFT HEART CATH AND CORONARY ANGIOGRAPHY;  Surgeon:  Burnell Blanks, MD;  Location: [place] [facility];  Service: Cardiovascular;  Laterality: N/A;   TOTAL KNEE ARTHROPLASTY Left 07/22/2021   Procedure: LEFT TOTAL KNEE ARTHROPLASTY;  Surgeon: [name], MD;  Location: [facility];  Service: Orthopedics;  Laterality: Left;  RFNA   Patient Active Problem List   Diagnosis Date Noted   Status post total left knee replacement 07/22/2021   Unilateral primary osteoarthritis, left knee 06/13/2021   Dyspnea on exertion    Angina decubitus ([place]) 07/20/2015   CAD (coronary artery disease), native coronary artery, LAD on cardiac CT 07/20/2015   HTN (hypertension) 07/20/2015   Family history of premature CAD 07/20/2015   Hyperlipidemia LDL goal <100 07/20/2015   Angina at rest [facility]) 07/20/2015   Abnormal nuclear stress test    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 Yes, 3/10 Pain location: Left knee anterior more medial & posterior Pain description: jabbing & throbbing Aggravating factors: bending knee Relieving factors: Advil, yesterday Rx 2x, ice, topical rub around knee but not over incision  OBJECTIVE: (objective measures completed at initial evaluation unless otherwise dated)   PATIENT SURVEYS:  07/26/2021: FOTO: 13% (predicted 43%) 08/22/2021: FOTO: 39% 10th visit   POSTURE:  07/26/2021: Forward head and shoulders, increased thoracic kyphosis   PALPATION: 07/26/2021: TTP: around joint line              EDEMA:   07/26/2021: Rt knee circumference: 42.5 centimeters        Left knee circumference: 50 centimeters     LE ROM:   Active ROM Right 07/26/2021 Left 07/26/2021 Left 08/04/21 Left 08/09/21 Left 08/22/21  Knee flexion 124 50 57 (siting) 63 (sitting) 66  Knee extension -2 -10 -17 (seated LAQ) -12 (seated LAQ) -8   (Blank rows = not tested)      Passive ROM Left 07/26/21 Left 08/03/21 Left 08/04/21 Left 08/15/21 supine Left 08/22/21 supine  Knee flexion 58 52 (Seated) 62  (Supine) 60 70  Knee extension            -8  0 (supine) -2 -2      LE MMT:   MMT Right 07/26/2021 Left 07/26/2021  Hip flexion 5/5 5/5  Hip extension      Hip abduction 5/5 5/5  Hip adduction 5/5 5/5  Knee flexion 5/5 2/5  Knee extension 5/5 2/5   (Blank rows = not tested)     FUNCTIONAL TESTS:  07/26/2021: 5 times sit to stand: 47 seconds with RW with UE support   GAIT: 07/26/2021: Distance walked: 50 feet Assistive device utilized: Environmental consultant - 2 wheeled Level of assistance: SBA Comments: step to gait pattern with left knee immobilizer       TODAY'S TREATMENT: 08/24/21 Therex:      Aerobic: NuStep L6 x 8 min     Sitting: AA Lt knee flexion 10 x 15 sec hold; RLE providing assistance     Machines: Leg press bil 87# 3x10 (seat 2); LLE only 50# 3x10 1 min flexion hold x 2 reps       Standing: LLE on 6" step flexion stretch 10 x 10 sec Forward step ups on 4" step with 1UE assist 2x10  Manual: Seated PROM with flexion focus   08/22/2021 Therex:      Aerobic: recumbent bike seat 7: rocking back and forth for 5 sec hold x 8 minutes     Standing: Lt foot on 8" step leaning into flexion 10 x 10 sec hold Calf stretch: slant boar Leg press: 75# bilateral LE's 3 x 10,  Leg Press: 50# Left LE only: 2 x10 Step ups on 4 inch step x 10 Left LE single UE hold Sit to stand x 10 from arm chair, therapist foot in front of pt's left foot to prevent knee extension Neuromuscular Re-education:  Tandem stance  holding 20 seconds Tandem walking in parallel bars x 6 c finger taps UE support Manual PROM in supine knee flexion, hip flexion and knee extension with overpressure STM: quad muscles around incision site Modalities:  Vasopneumatic: Medium compression, 34 degrees, x 10 minutes   08/17/2021 Therex:      Aerobic: Scifit recumbent bike seat 10: rocking back and forth for 5 sec hold x 8 minutes     Standing: Lt  foot on 8" step leaning into flexion 10 x 10 sec hold Leg press: 56# bilateral LE's 2 sets x 15 reps 1st set seat at 4 & 2nd set seat 3 Leg Press: seat at 4 25# Left LE 15 reps 1 set RLE abd, flex & ext 5 reps with bar & cane support.   Manual PROM in supine knee flexion, hip flexion and knee extension   Self-Care:  PT educated on proper elevation with recommendation >/= 15 min >/= 2x/day. Pt verbalized understanding.     PT demo & verbal cues on scar mob with approximation & same direction soft tissue. Pt return demo & verbalized understanding. PT demo & verbal cues on ice massage. Pt return demo & verbalized understanding. PT verbal cues on positioning in bed with pillows to "tent" sheets off feet & between LEs in sidelying. Using step stool for RLE to get on & off high bed. Pt recommended trying during day initially then progress to starting in bed for night.  Continue current pain med use. Pt verbalized understanding.  Therapeutic Activities: Weight shifting onto LLE 2-3 sec hold 3-5 reps upon arising. Weight shift onto LLE initial contact position with RLE flexing and LLE terminal stance knee flexion with weight shift to RLE 10 reps ea cane & bar support.   Gait: Amb with SPC with min guard A focusing on carryover for weight shift initial contact to midstance with knee ext and terminal stance to swing knee flexion. Pt also worked on form with RW.   Modalities: Vaso: Lt knee, mod pressure x 10 min, 34 degrees:    PATIENT EDUCATION:  Education details: PT POC, HEP Person  educated: Patient and Spouse Education method: Explanation, Demonstration, Tactile cues, Verbal cues, and Handouts Education comprehension: verbalized understanding, returned demonstration, and needs further education     HOME EXERCISE PROGRAM: Access Code: [email or web address] URL: [email or web address] Date: 07/26/2021 Prepared by: Kearney Hard   Exercises - Seated Heel Slide  - 3 x daily - 7 x weekly - 2 sets - 10 reps - Sit to Stand with Counter Support  - 3 x daily - 7 x weekly - 10 reps - Supine Knee Extension Strengthening  - 3 x daily - 7 x weekly - 2 sets - 10 reps - 3 seconds hold - Seated Long Arc Quad  - 3 x daily - 7 x weekly - 2 sets - 10 reps - 2-3 seconds hold - Seated Knee Flexion AAROM  - 3 x daily - 7 x weekly - 10 reps - Seated Hip Adduction Isometrics with Ball  - 3 x daily - 7 x weekly - 10 reps - 5 seconds hold   ASSESSMENT:   CLINICAL IMPRESSION: Pt tolerated session well today with continued focus on ROM and strengthening.  Continues to be limited with flexion due to pain.  Will continue to benefit from PT to maximize function.    OBJECTIVE IMPAIRMENTS Abnormal gait, decreased activity tolerance, decreased balance, decreased endurance, decreased mobility, difficulty walking, decreased ROM, decreased strength, increased edema, impaired flexibility, obesity, and pain.    ACTIVITY LIMITATIONS cleaning, community activity, and occupation.    PERSONAL FACTORS 3+ comorbidities: CAD, CHF, arthritis, dyspnea, GERD, HA, kidney stones, h/o penumonia, hyperlipidemia  are also affecting patient's functional outcome.      REHAB POTENTIAL: Excellent   CLINICAL DECISION MAKING: Stable/uncomplicated   EVALUATION COMPLEXITY: Low     GOALS: Goals reviewed with patient? Yes Short term PT Goals (target date for Short term goals are  4 weeks 08/26/2021) Patient will demonstrate independent use of initial home exercise program to maintain progress from in clinic  treatments. Goal status: MET 08/22/2021   Long term PT goals (target dates for all long term goals are 12 weeks  10/21/2021) Patient will demonstrate/report pain at worst less than or equal to 2/10 to facilitate minimal limitation in daily activity secondary to pain symptoms. Goal status: New   Patient will demonstrate independent use of home exercise program to facilitate ability to maintain/progress functional gains from skilled physical therapy services. Goal status: New   Patient will demonstrate FOTO outcome > or = 43 % to indicate reduced disability due to condition. Goal status: New   Pt will be able to amb with LRAD on community based surfaces safely >/= 1500 feet. Goal status: New       5.  Pt will be able to navigate stairs with single hand rail with step over step gait pattern with pain </= 2/10 in left knee.   Goal status: New       PLAN: PT FREQUENCY:  3x/ week for first 2 weeks and then drop 2x/ week for 12 weeks total.    PT DURATION: 12 weeks   PLANNED INTERVENTIONS: Therapeutic exercises, Therapeutic activity, Neuromuscular re-education, Balance training, Gait training, Patient/Family education, Joint mobilization, Stair training, Dry Needling, Electrical stimulation, Cryotherapy, Moist heat, scar mobilization, Taping, Vasopneumatic device, and Manual therapy   PLAN FOR NEXT SESSION:  bike vs Nustep, strategies for edema/motion,  manual therapy & there ex for knee ROM, quad strengthening, standing exercises as tolerated, continue gait with SPC     Laureen Abrahams, PT, DPT 08/24/21 12:26 PM

## 2021-08-26 ENCOUNTER — Ambulatory Visit (INDEPENDENT_AMBULATORY_CARE_PROVIDER_SITE_OTHER): Payer: BC Managed Care – PPO | Admitting: Physical Therapy

## 2021-08-26 ENCOUNTER — Encounter: Payer: Self-pay | Admitting: Physical Therapy

## 2021-08-26 DIAGNOSIS — M25562 Pain in left knee: Secondary | ICD-10-CM

## 2021-08-26 DIAGNOSIS — R262 Difficulty in walking, not elsewhere classified: Secondary | ICD-10-CM

## 2021-08-26 DIAGNOSIS — R600 Localized edema: Secondary | ICD-10-CM

## 2021-08-26 DIAGNOSIS — M25662 Stiffness of left knee, not elsewhere classified: Secondary | ICD-10-CM

## 2021-08-26 DIAGNOSIS — M6281 Muscle weakness (generalized): Secondary | ICD-10-CM

## 2021-08-26 NOTE — Therapy (Signed)
OUTPATIENT PHYSICAL THERAPY TREATMENT NOTE   Patient Name: Maria Becker MRN: [id number] DOB:Apr 21, 1960, 61 y.o., female Today's Date: 08/26/2021  PCP: Celene Squibb, MD REFERRING PROVIDER: Jean Rosenthal MD  END OF SESSION:   PT End of Session - 08/26/21 0942     Visit Number 12    Number of Visits 26    Date for PT Re-Evaluation 10/21/21    Authorization Type BCBS    Authorization Time Period 25% COINSURANCE  Deductible ($2,000.00)  Deductible has been met  Out-of-Pocket Limit ($8,700.00) $6,295.64 Paid  $2,404.36 to go    PT Start Time 0927    PT Stop Time 1016    PT Time Calculation (min) 49 min    Equipment Utilized During Treatment Gait belt;Left knee immobilizer    Activity Tolerance Patient tolerated treatment well;Patient limited by pain    Behavior During Therapy WFL for tasks assessed/performed                      Past Medical History:  Diagnosis Date   Arthritis    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 cath 07/2015: 30-50% mid-LAD stenosis but otherwise normal cors   CHF (congestive heart failure) (HCC)    Dyspnea    SOME SOB WITH EXERTION   Family history of adverse reaction to anesthesia    MOTHER SLOW TO WAKE UP   Fibromyalgia    GERD (gastroesophageal reflux disease)    Headache    History of kidney stones    Hyperlipidemia    a. not currently on statin therapy   Hypothyroidism    Insomnia    Pneumonia    HX OF AS A BABY   Past Surgical History:  Procedure Laterality Date   ABDOMINAL HYSTERECTOMY     BACK SURGERY     CARDIAC CATHETERIZATION N/A 07/20/2015   Procedure: Left Heart Cath and Coronary Angiography;  Surgeon: Belva Crome, MD;  Location: [place] [facility];  Service: Cardiovascular;  Laterality: N/A;   CESAREAN SECTION     L4-L5 fusion     RIGHT/LEFT HEART CATH AND CORONARY ANGIOGRAPHY N/A 04/15/2021   Procedure: RIGHT/LEFT HEART CATH AND CORONARY ANGIOGRAPHY;  Surgeon:  Burnell Blanks, MD;  Location: [place] [facility];  Service: Cardiovascular;  Laterality: N/A;   TOTAL KNEE ARTHROPLASTY Left 07/22/2021   Procedure: LEFT TOTAL KNEE ARTHROPLASTY;  Surgeon: [name], MD;  Location: [facility];  Service: Orthopedics;  Laterality: Left;  RFNA   Patient Active Problem List   Diagnosis Date Noted   Status post total left knee replacement 07/22/2021   Unilateral primary osteoarthritis, left knee 06/13/2021   Dyspnea on exertion    Angina decubitus ([place]) 07/20/2015   CAD (coronary artery disease), native coronary artery, LAD on cardiac CT 07/20/2015   HTN (hypertension) 07/20/2015   Family history of premature CAD 07/20/2015   Hyperlipidemia LDL goal <100 07/20/2015   Ang[facility] at rest [facility]) 07/20/2015   Abnormal nuclear stress test    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 Yes, 3/10 Pain location: Left knee anterior more medial & posterior Pain description: jabbing & throbbing Aggravating factors: bending knee Relieving factors: Advil, yesterday Rx 2x, ice, topical rub around knee but not over incision  OBJECTIVE: (objective measures completed at initial evaluation unless otherwise dated)   PATIENT SURVEYS:  07/26/2021: FOTO: 13% (predicted 43%) 08/22/2021: FOTO: 39% 10th visit   POSTURE:  07/26/2021: Forward head and shoulders, increased thoracic kyphosis   PALPATION: 07/26/2021: TTP: around joint line               EDEMA:  07/26/2021: Rt knee circumference: 42.5 centimeters        Left knee circumference: 50 centimeters     LE ROM:   Active ROM Right 07/26/2021 Left 07/26/2021 Left 08/04/21 Left 08/09/21 Left 08/22/21  Knee flexion 124 50 57 (siting) 63 (sitting) 66  Knee extension -2 -10 -17 (seated LAQ) -12 (seated LAQ) -8   (Blank rows = not tested)      Passive ROM Left 07/26/21 Left 08/03/21 Left 08/04/21 Left 08/15/21 supine Left 08/22/21 supine Left 08/26/21  Knee flexion 58 52 (Seated) 62  (Supine) 60 70 74  Knee extension            -8  0 (supine) -2 -2       LE MMT:   MMT Right 07/26/2021 Left 07/26/2021  Hip flexion 5/5 5/5  Hip extension      Hip abduction 5/5 5/5  Hip adduction 5/5 5/5  Knee flexion 5/5 2/5  Knee extension 5/5 2/5   (Blank rows = not tested)     FUNCTIONAL TESTS:  07/26/2021: 5 times sit to stand: 47 seconds with RW with UE support   GAIT: 07/26/2021: Distance walked: 50 feet Assistive device utilized: Environmental consultant - 2 wheeled Level of assistance: SBA Comments: step to gait pattern with left knee immobilizer       TODAY'S TREATMENT: 08/26/21 Therex:      Aerobic: Recumbent bike seat 5: partial revolutions x 8 min     Machines: Leg Press 87# 3x10, LLE only 50# 3x10 (1 min flexion hold x 2 reps) Knee extension 5# 3x10 LLE only (limited range and cues needed to push into resistance)  Manual: Seated knee flexion PROM, and with contract/relax Modalities: Vaso Lt knee x 10 min; mod pressure, 34 deg    08/24/21 Therex:      Aerobic: NuStep L6 x 8 min     Sitting: AA Lt knee flexion 10 x 15 sec hold; RLE providing assistance     Machines: Leg press bil 87# 3x10 (seat 2); LLE only 50# 3x10 1 min flexion hold x 2 reps       Standing: LLE on 6" step flexion stretch 10 x 10 sec Forward step ups on 4" step with 1UE assist 2x10  Manual: Seated PROM with flexion focus   08/22/2021 Therex:      Aerobic: recumbent bike seat 7: rocking  back and forth for 5 sec hold x 8 minutes     Standing: Lt foot on 8" step leaning into flexion 10 x 10 sec hold Calf stretch: slant boar Leg press: 75# bilateral LE's 3 x 10,  Leg Press: 50# Left LE only: 2 x10 Step ups on 4 inch step x 10 Left LE single UE hold Sit to stand x 10 from arm chair, therapist foot in front of pt's left foot to prevent  knee extension Neuromuscular Re-education:  Tandem stance holding 20 seconds Tandem walking in parallel bars x 6 c finger taps UE support Manual PROM in supine knee flexion, hip flexion and knee extension with overpressure STM: quad muscles around incision site Modalities:  Vasopneumatic: Medium compression, 34 degrees, x 10 minutes      PATIENT EDUCATION:  Education details: PT POC, HEP Person educated: Patient and Spouse Education method: Explanation, Media planner, Corporate treasurer cues, Verbal cues, and Handouts Education comprehension: verbalized understanding, returned demonstration, and needs further education     HOME EXERCISE PROGRAM: Access Code: [email or web address] URL: [email or web address] Date: 07/26/2021 Prepared by: Kearney Hard   Exercises - Seated Heel Slide  - 3 x daily - 7 x weekly - 2 sets - 10 reps - Sit to Stand with Counter Support  - 3 x daily - 7 x weekly - 10 reps - Supine Knee Extension Strengthening  - 3 x daily - 7 x weekly - 2 sets - 10 reps - 3 seconds hold - Seated Long Arc Quad  - 3 x daily - 7 x weekly - 2 sets - 10 reps - 2-3 seconds hold - Seated Knee Flexion AAROM  - 3 x daily - 7 x weekly - 10 reps - Seated Hip Adduction Isometrics with Ball  - 3 x daily - 7 x weekly - 10 reps - 5 seconds hold   ASSESSMENT:   CLINICAL IMPRESSION: Slight improvement in PROM flexion today but still with increased pain with ROM exercises.  Continue skilled PT.     OBJECTIVE IMPAIRMENTS Abnormal gait, decreased activity tolerance, decreased balance, decreased endurance, decreased mobility, difficulty walking,  decreased ROM, decreased strength, increased edema, impaired flexibility, obesity, and pain.    ACTIVITY LIMITATIONS cleaning, community activity, and occupation.    PERSONAL FACTORS 3+ comorbidities: CAD, CHF, arthritis, dyspnea, GERD, HA, kidney stones, h/o penumonia, hyperlipidemia  are also affecting patient's functional outcome.      REHAB POTENTIAL: Excellent   CLINICAL DECISION MAKING: Stable/uncomplicated   EVALUATION COMPLEXITY: Low     GOALS: Goals reviewed with patient? Yes Short term PT Goals (target date for Short term goals are 4 weeks 08/26/2021) Patient will demonstrate independent use of initial home exercise program to maintain progress from in clinic treatments. Goal status: MET 08/22/2021   Long term PT goals (target dates for all long term goals are 12 weeks  10/21/2021) Patient will demonstrate/report pain at worst less than or equal to 2/10 to facilitate minimal limitation in daily activity secondary to pain symptoms. Goal status: New   Patient will demonstrate independent use of home exercise program to facilitate ability to maintain/progress functional gains from skilled physical therapy services. Goal status: New   Patient will demonstrate FOTO outcome > or = 43 % to indicate reduced disability due to condition. Goal status: New   Pt will be able to amb with LRAD on community based surfaces safely >/= 1500 feet. Goal status: New       5.  Pt will be able to navigate stairs with single hand rail with step over step gait pattern with pain </= 2/10 in left knee.   Goal status: New       PLAN: PT FREQUENCY:  3x/ week for first 2 weeks and then drop 2x/ week for 12 weeks total.    PT DURATION: 12 weeks   PLANNED INTERVENTIONS: Therapeutic exercises, Therapeutic activity, Neuromuscular re-education, Balance training, Gait training, Patient/Family education, Joint mobilization, Stair training, Dry Needling, Electrical stimulation, Cryotherapy, Moist heat,  scar mobilization, Taping, Vasopneumatic device, and Manual therapy   PLAN FOR NEXT SESSION: Bike, strategies for edema/motion,  manual therapy & there ex for knee ROM, quad strengthening, standing exercises as tolerated, continue gait with SPC     Laureen Abrahams, PT, DPT 08/26/21 10:09 AM

## 2021-08-29 ENCOUNTER — Ambulatory Visit (INDEPENDENT_AMBULATORY_CARE_PROVIDER_SITE_OTHER): Payer: BC Managed Care – PPO | Admitting: Physical Therapy

## 2021-08-29 ENCOUNTER — Encounter: Payer: Self-pay | Admitting: Physical Therapy

## 2021-08-29 DIAGNOSIS — R600 Localized edema: Secondary | ICD-10-CM

## 2021-08-29 DIAGNOSIS — M25662 Stiffness of left knee, not elsewhere classified: Secondary | ICD-10-CM

## 2021-08-29 DIAGNOSIS — M6281 Muscle weakness (generalized): Secondary | ICD-10-CM

## 2021-08-29 DIAGNOSIS — M25562 Pain in left knee: Secondary | ICD-10-CM | POA: Diagnosis not present

## 2021-08-29 DIAGNOSIS — R262 Difficulty in walking, not elsewhere classified: Secondary | ICD-10-CM

## 2021-08-29 NOTE — Therapy (Signed)
OUTPATIENT PHYSICAL THERAPY TREATMENT NOTE   Patient Name: Maria Becker MRN: [id number] DOB:09/19/1960, 61 y.o., female Today's Date: 08/29/2021  PCP: Celene Squibb, MD REFERRING PROVIDER: Jean Rosenthal MD  END OF SESSION:   PT End of Session - 08/29/21 1143     Visit Number 13    Number of Visits 26    Date for PT Re-Evaluation 10/21/21    Authorization Type BCBS    Authorization Time Period 25% COINSURANCE  Deductible ($2,000.00)  Deductible has been met  Out-of-Pocket Limit ($8,700.00) $6,295.64 Paid  $2,404.36 to go    PT Start Time 1140    PT Stop Time 1225    PT Time Calculation (min) 45 min    Equipment Utilized During Treatment Gait belt;Left knee immobilizer    Activity Tolerance Patient tolerated treatment well;Patient limited by pain    Behavior During Therapy WFL for tasks assessed/performed                       Past Medical History:  Diagnosis Date   Arthritis    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 cath 07/2015: 30-50% mid-LAD stenosis but otherwise normal cors   CHF (congestive heart failure) (HCC)    Dyspnea    SOME SOB WITH EXERTION   Family history of adverse reaction to anesthesia    MOTHER SLOW TO WAKE UP   Fibromyalgia    GERD (gastroesophageal reflux disease)    Headache    History of kidney stones    Hyperlipidemia    a. not currently on statin therapy   Hypothyroidism    Insomnia    Pneumonia    HX OF AS A BABY   Past Surgical History:  Procedure Laterality Date   ABDOMINAL HYSTERECTOMY     BACK SURGERY     CARDIAC CATHETERIZATION N/A 07/20/2015   Procedure: Left Heart Cath and Coronary Angiography;  Surgeon: Belva Crome, MD;  Location: [place] [facility];  Service: Cardiovascular;  Laterality: N/A;   CESAREAN SECTION     L4-L5 fusion     RIGHT/LEFT HEART CATH AND CORONARY ANGIOGRAPHY N/A 04/15/2021   Procedure: RIGHT/LEFT HEART CATH AND CORONARY ANGIOGRAPHY;  Surgeon:  Burnell Blanks, MD;  Location: [place] [facility];  Service: Cardiovascular;  Laterality: N/A;   TOTAL KNEE ARTHROPLASTY Left 07/22/2021   Procedure: LEFT TOTAL KNEE ARTHROPLASTY;  Surgeon: [name], MD;  Location: [facility];  Service: Orthopedics;  Laterality: Left;  RFNA   Patient Active Problem List   Diagnosis Date Noted   Status post total left knee replacement 07/22/2021   Unilateral primary osteoarthritis, left knee 06/13/2021   Dyspnea on exertion    Angina decubitus ([place]) 07/20/2015   CAD (coronary artery disease), native coronary artery, LAD on cardiac CT 07/20/2015   HTN (hypertension) 07/20/2015   Family history of premature CAD 07/20/2015   Hyperlipidemia LDL goal <100 07/20/2015   Angina at rest [facility]) 07/20/2015   Abnormal nuclear stress test    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 Yes, 4/10 Pain location: Left knee anterior more medial & posterior Pain description: jabbing & throbbing Aggravating factors: bending knee Relieving factors: Advil, yesterday Rx 2x, ice, topical rub around knee but not over incision  OBJECTIVE: (objective measures completed at initial evaluation unless otherwise dated)   PATIENT SURVEYS:  07/26/2021: FOTO: 13% (predicted 43%) 08/22/2021: FOTO: 39% 10th visit   POSTURE:  07/26/2021: Forward head and shoulders, increased thoracic  kyphosis   PALPATION: 07/26/2021: TTP: around joint line              EDEMA:  07/26/2021: Rt knee circumference: 42.5 centimeters        Left knee circumference: 50 centimeters     LE ROM:   Active ROM Right 07/26/2021 Left 07/26/2021 Left 08/04/21 Left 08/09/21 Left 08/22/21  Knee flexion 124 50 57 (siting) 63 (sitting) 66  Knee extension -2 -10 -17 (seated LAQ) -12 (seated LAQ) -8   (Blank rows = not tested)      Passive ROM Left 07/26/21 Left 08/03/21 Left 08/04/21 Left 08/15/21 supine Left 08/22/21 supine Left 08/26/21  Knee flexion 58 52 (Seated) 62  (Supine) 60 70 74  Knee extension            -8  0 (supine) -2 -2       LE MMT:   MMT Right 07/26/2021 Left 07/26/2021  Hip flexion 5/5 5/5  Hip extension      Hip abduction 5/5 5/5  Hip adduction 5/5 5/5  Knee flexion 5/5 2/5  Knee extension 5/5 2/5   (Blank rows = not tested)     FUNCTIONAL TESTS:  07/26/2021: 5 times sit to stand: 47 seconds with RW with UE support   GAIT: 07/26/2021: Distance walked: 50 feet Assistive device utilized: Environmental consultant - 2 wheeled Level of assistance: SBA Comments: step to gait pattern with left knee immobilizer       TODAY'S TREATMENT: 08/29/21 Therex:      Aerobic: NuStep L5 x 8 min     Sitting: LAQ/knee flexion with 3 sec hold on Lt and 5# 3x10  Manual: LLLD holds in sitting for Lt knee flexion, then PROM within tolerance Modalities: Vaso Lt knee x 10 min, 34 deg, mod pressure   08/26/21 Therex:      Aerobic: Recumbent bike seat 5: partial revolutions x 8 min     Machines: Leg Press 87# 3x10, LLE only 50# 3x10 (1 min flexion hold x 2 reps) Knee extension 5# 3x10 LLE only (limited range and cues needed to push into resistance)  Manual: Seated knee flexion PROM, and with contract/relax Modalities: Vaso Lt knee x 10 min; mod pressure, 34 deg    08/24/21 Therex:      Aerobic: NuStep L6 x 8 min     Sitting: AA Lt knee flexion 10 x 15 sec hold; RLE providing  assistance     Machines: Leg press bil 87# 3x10 (seat 2); LLE only 50# 3x10 1 min flexion hold x 2 reps       Standing: LLE on 6" step flexion stretch 10 x 10 sec Forward step ups on 4" step with 1UE assist 2x10  Manual: Seated PROM with flexion focus       PATIENT EDUCATION:  Education details: PT POC, HEP Person educated: Patient and Spouse Education method: Explanation, Demonstration, Tactile cues,  Verbal cues, and Handouts Education comprehension: verbalized understanding, returned demonstration, and needs further education     HOME EXERCISE PROGRAM: Access Code: [email or web address] URL: [email or web address] Date: 07/26/2021 Prepared by: Kearney Hard   Exercises - Seated Heel Slide  - 3 x daily - 7 x weekly - 2 sets - 10 reps - Sit to Stand with Counter Support  - 3 x daily - 7 x weekly - 10 reps - Supine Knee Extension Strengthening  - 3 x daily - 7 x weekly - 2 sets - 10 reps - 3 seconds hold - Seated Long Arc Quad  - 3 x daily - 7 x weekly - 2 sets - 10 reps - 2-3 seconds hold - Seated Knee Flexion AAROM  - 3 x daily - 7 x weekly - 10 reps - Seated Hip Adduction Isometrics with Ball  - 3 x daily - 7 x weekly - 10 reps - 5 seconds hold   ASSESSMENT:   CLINICAL IMPRESSION: Pt still with significant pain impacting ability to progress flexion at this time.  Scaled back on activity today due to increased pain from prior session.  Will continue to benefit from PT to maximize function.   OBJECTIVE IMPAIRMENTS Abnormal gait, decreased activity tolerance, decreased balance, decreased endurance, decreased mobility, difficulty walking, decreased ROM, decreased strength, increased edema, impaired flexibility, obesity, and pain.    ACTIVITY LIMITATIONS cleaning, community activity, and occupation.    PERSONAL FACTORS 3+ comorbidities: CAD, CHF, arthritis, dyspnea, GERD, HA, kidney stones, h/o penumonia, hyperlipidemia  are also affecting patient's functional outcome.       REHAB POTENTIAL: Excellent   CLINICAL DECISION MAKING: Stable/uncomplicated   EVALUATION COMPLEXITY: Low     GOALS: Goals reviewed with patient? Yes Short term PT Goals (target date for Short term goals are 4 weeks 08/26/2021) Patient will demonstrate independent use of initial home exercise program to maintain progress from in clinic treatments. Goal status: MET 08/22/2021   Long term PT goals (target dates for all long term goals are 12 weeks  10/21/2021) Patient will demonstrate/report pain at worst less than or equal to 2/10 to facilitate minimal limitation in daily activity secondary to pain symptoms. Goal status: New   Patient will demonstrate independent use of home exercise program to facilitate ability to maintain/progress functional gains from skilled physical therapy services. Goal status: New   Patient will demonstrate FOTO outcome > or = 43 % to indicate reduced disability due to condition. Goal status: New   Pt will be able to amb with LRAD on community based surfaces safely >/= 1500 feet. Goal status: New       5.  Pt will be able to navigate stairs with single hand rail with step over step gait pattern with pain </= 2/10 in left knee.   Goal status: New       PLAN: PT FREQUENCY:  3x/ week for first 2 weeks and then drop 2x/ week for 12 weeks total.    PT DURATION: 12 weeks   PLANNED INTERVENTIONS: Therapeutic exercises, Therapeutic activity, Neuromuscular re-education, Balance training, Gait training, Patient/Family education, Joint mobilization, Stair training, Dry Needling, Electrical stimulation, Cryotherapy, Moist heat, scar mobilization, Taping, Vasopneumatic device, and Manual therapy   PLAN FOR NEXT SESSION: Bike, strategies for edema/motion,  manual therapy & there ex for knee ROM, quad strengthening, standing exercises as tolerated, continue gait with SPC; will need MD note     Laureen Abrahams, PT, DPT 08/29/21 12:20 PM

## 2021-08-31 ENCOUNTER — Other Ambulatory Visit: Payer: Self-pay | Admitting: Orthopaedic Surgery

## 2021-08-31 ENCOUNTER — Encounter: Payer: Self-pay | Admitting: Physical Therapy

## 2021-08-31 ENCOUNTER — Ambulatory Visit (INDEPENDENT_AMBULATORY_CARE_PROVIDER_SITE_OTHER): Payer: BC Managed Care – PPO | Admitting: Physical Therapy

## 2021-08-31 DIAGNOSIS — M25662 Stiffness of left knee, not elsewhere classified: Secondary | ICD-10-CM | POA: Diagnosis not present

## 2021-08-31 DIAGNOSIS — R600 Localized edema: Secondary | ICD-10-CM

## 2021-08-31 DIAGNOSIS — R262 Difficulty in walking, not elsewhere classified: Secondary | ICD-10-CM

## 2021-08-31 DIAGNOSIS — M25562 Pain in left knee: Secondary | ICD-10-CM

## 2021-08-31 DIAGNOSIS — M6281 Muscle weakness (generalized): Secondary | ICD-10-CM | POA: Diagnosis not present

## 2021-08-31 MED ORDER — GABAPENTIN 300 MG PO CAPS
300.0000 mg | ORAL_CAPSULE | Freq: Every day | ORAL | 1 refills | Status: DC
Start: 2021-08-31 — End: 2021-10-05

## 2021-08-31 MED ORDER — HYDROCODONE-ACETAMINOPHEN 7.5-325 MG PO TABS
1.0000 | ORAL_TABLET | Freq: Four times a day (QID) | ORAL | 0 refills | Status: DC | PRN
Start: 2021-08-31 — End: 2021-10-05

## 2021-08-31 NOTE — Therapy (Signed)
OUTPATIENT PHYSICAL THERAPY TREATMENT NOTE   Patient Name: Maria Becker MRN: [id number] DOB:September 18, 1960, 61 y.o., female Today's Date: 08/31/2021  PCP: Celene Squibb, MD REFERRING PROVIDER: Jean Rosenthal MD  END OF SESSION:   PT End of Session - 08/31/21 1139     Visit Number 14    Number of Visits 26    Date for PT Re-Evaluation 10/21/21    Authorization Type BCBS    Authorization Time Period 25% COINSURANCE  Deductible ($2,000.00)  Deductible has been met  Out-of-Pocket Limit ($8,700.00) $6,295.64 Paid  $2,404.36 to go    Authorization - Number of Visits 30    PT Start Time 1136    PT Stop Time 1227    PT Time Calculation (min) 51 min    Equipment Utilized During Treatment --    Activity Tolerance Patient tolerated treatment well;Patient limited by pain    Behavior During Therapy [facility] for tasks assessed/performed                        Past Medical History:  Diagnosis Date   Arthritis    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 cath 07/2015: 30-50% mid-LAD stenosis but otherwise normal cors   CHF (congestive heart failure) (HCC)    Dyspnea    SOME SOB WITH EXERTION   Family history of adverse reaction to anesthesia    MOTHER SLOW TO WAKE UP   Fibromyalgia    GERD (gastroesophageal reflux disease)    Headache    History of kidney stones    Hyperlipidemia    a. not currently on statin therapy   Hypothyroidism    Insomnia    Pneumonia    HX OF AS A BABY   Past Surgical History:  Procedure Laterality Date   ABDOMINAL HYSTERECTOMY     BACK SURGERY     CARDIAC CATHETERIZATION N/A 07/20/2015   Procedure: Left Heart Cath and Coronary Angiography;  Surgeon: Belva Crome, MD;  Location: [place] [facility];  Service: Cardiovascular;  Laterality: N/A;   CESAREAN SECTION     L4-L5 fusion     RIGHT/LEFT HEART CATH AND CORONARY ANGIOGRAPHY N/A 04/15/2021   Procedure: RIGHT/LEFT HEART CATH AND CORONARY ANGIOGRAPHY;   Surgeon: Burnell Blanks, MD;  Location: [place] [facility];  Service: Cardiovascular;  Laterality: N/A;   TOTAL KNEE ARTHROPLASTY Left 07/22/2021   Procedure: LEFT TOTAL KNEE ARTHROPLASTY;  Surgeon: [name], MD;  Location: [facility];  Service: Orthopedics;  Laterality: Left;  RFNA   Patient Active Problem List   Diagnosis Date Noted   Status post total left knee replacement 07/22/2021   Unilateral primary osteoarthritis, left knee 06/13/2021   Dyspnea on exertion    Angina decubitus ([place]) 07/20/2015   CAD (coronary artery disease), native coronary artery, LAD on cardiac CT 07/20/2015   HTN (hypertension) 07/20/2015   Family history of premature CAD 07/20/2015   Hyperlipidemia LDL goal <100 07/20/2015   Angina at rest [facility]) 07/20/2015   Abnormal nuclear stress test    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  PAIN:  Are you having pain? Yes, 4/10 Pain location: Left knee anterior more medial & posterior Pain description: jabbing & throbbing Aggravating factors: bending knee Relieving factors: Advil, yesterday Rx 2x, ice, topical rub around knee but not over incision  OBJECTIVE: (objective measures completed at initial evaluation unless otherwise dated)   PATIENT SURVEYS:  07/26/2021: FOTO: 13% (predicted 43%) 08/22/2021: FOTO: 39% 10th visit   POSTURE:  07/26/2021: Forward head and shoulders, increased thoracic kyphosis   PALPATION: 07/26/2021: TTP: around joint line               EDEMA:  07/26/2021: Rt knee circumference: 42.5 centimeters        Left knee circumference: 50 centimeters     LE ROM:   Active ROM Right 07/26/2021 Left 07/26/2021 Left 08/04/21 Left 08/09/21 Left 08/22/21  Knee flexion 124 50 57 (siting) 63 (sitting) 66  Knee extension -2 -10 -17 (seated LAQ) -12 (seated LAQ) -8   (Blank rows = not tested)      Passive ROM Left 07/26/21 Left 08/03/21 Left 08/04/21 Left 08/15/21 supine Left 08/22/21 supine Left 08/26/21 Left 08/31/21  Knee flexion 58 52 (Seated) 62  (Supine) 60 70 74 74  Knee extension            -8  0 (supine) -2 -2        LE MMT:   MMT Right 07/26/2021 Left 07/26/2021  Hip flexion 5/5 5/5  Hip extension      Hip abduction 5/5 5/5  Hip adduction 5/5 5/5  Knee flexion 5/5 2/5  Knee extension 5/5 2/5   (Blank rows = not tested)     FUNCTIONAL TESTS:  07/26/2021: 5 times sit to stand: 47 seconds with RW with UE support   GAIT: 07/26/2021: Distance walked: 50 feet Assistive device utilized: Environmental consultant - 2 wheeled Level of assistance: SBA Comments: step to gait pattern with left knee immobilizer       TODAY'S TREATMENT: 08/31/21 Therex:      Aerobic: Recumbent bike seat 5 x 8 min; partial revolutions     Machines: Leg Press (seat 2) bil 87# 3x10; LLE only 50# 3x10 (1 min flexion holds x 2)     Standing: Lt foot on 6" step 5x20 sec flexion holds     Sitting: LAQ LLE 5# 3x10; with alt Rt LAQ for flexion hold Sit to/from stand 2x10 from elevated mat table  Manual: Seated PROM Lt knee flexion including LLLD stretch (pt only tolerating up to 1 min), limited tolerance to increased PROM Modalities: Vaso x 10 min, Mod pressure, 34 deg; Lt knee   08/29/21 Therex:      Aerobic: NuStep L5 x 8 min     Sitting: LAQ/knee flexion with 3 sec hold on Lt and 5# 3x10  Manual: LLLD holds in sitting for Lt knee flexion, then PROM within tolerance Modalities: Vaso Lt knee x 10 min, 34 deg, mod  pressure   08/26/21 Therex:      Aerobic: Recumbent bike seat 5: partial revolutions x 8 min     Machines: Leg Press 87# 3x10, LLE only 50# 3x10 (1 min flexion hold x 2 reps) Knee extension 5# 3x10 LLE only (limited range and cues needed to push into resistance)  Manual: Seated knee flexion PROM, and with contract/relax Modalities: Vaso Lt knee x 10 min; mod pressure, 34 deg  PATIENT EDUCATION:  Education details: PT POC, HEP Person educated: Patient and Spouse Education method: Explanation, Demonstration, Corporate treasurer cues, Verbal cues, and Handouts Education comprehension: verbalized understanding, returned demonstration, and needs further education     HOME EXERCISE PROGRAM: Access Code: [email or web address] URL: [email or web address] Date: 07/26/2021 Prepared by: Kearney Hard   Exercises - Seated Heel Slide  - 3 x daily - 7 x weekly - 2 sets - 10 reps - Sit to Stand with Counter Support  - 3 x daily - 7 x weekly - 10 reps - Supine Knee Extension Strengthening  - 3 x daily - 7 x weekly - 2 sets - 10 reps - 3 seconds hold - Seated Long Arc Quad  - 3 x daily - 7 x weekly - 2 sets - 10 reps - 2-3 seconds hold - Seated Knee Flexion AAROM  - 3 x daily - 7 x weekly - 10 reps - Seated Hip Adduction Isometrics with Ball  - 3 x daily - 7 x weekly - 10 reps - 5 seconds hold   ASSESSMENT:   CLINICAL IMPRESSION: Pt continues to lack Lt knee flexion at this time.  She returns to MD and at that time will likely discuss MUA.  She continues to be limited by pain with flexion activities.  Will continue to benefit from PT to maximize function.   OBJECTIVE IMPAIRMENTS Abnormal gait, decreased activity tolerance, decreased balance, decreased endurance, decreased mobility, difficulty walking, decreased ROM, decreased strength, increased edema, impaired flexibility, obesity, and pain.    ACTIVITY LIMITATIONS cleaning, community activity, and occupation.    PERSONAL FACTORS 3+  comorbidities: CAD, CHF, arthritis, dyspnea, GERD, HA, kidney stones, h/o penumonia, hyperlipidemia  are also affecting patient's functional outcome.      REHAB POTENTIAL: Excellent   CLINICAL DECISION MAKING: Stable/uncomplicated   EVALUATION COMPLEXITY: Low     GOALS: Goals reviewed with patient? Yes Short term PT Goals (target date for Short term goals are 4 weeks 08/26/2021) Patient will demonstrate independent use of initial home exercise program to maintain progress from in clinic treatments. Goal status: MET 08/22/2021   Long term PT goals (target dates for all long term goals are 12 weeks  10/21/2021) Patient will demonstrate/report pain at worst less than or equal to 2/10 to facilitate minimal limitation in daily activity secondary to pain symptoms. Goal status: New   Patient will demonstrate independent use of home exercise program to facilitate ability to maintain/progress functional gains from skilled physical therapy services. Goal status: New   Patient will demonstrate FOTO outcome > or = 43 % to indicate reduced disability due to condition. Goal status: New   Pt will be able to amb with LRAD on community based surfaces safely >/= 1500 feet. Goal status: New       5.  Pt will be able to navigate stairs with single hand rail with step over step gait pattern with pain </= 2/10 in left knee.   Goal status: New       PLAN: PT FREQUENCY:  3x/ week for first 2 weeks and then drop 2x/ week for 12 weeks total.    PT DURATION: 12 weeks   PLANNED INTERVENTIONS: Therapeutic exercises, Therapeutic activity, Neuromuscular re-education, Balance training, Gait training, Patient/Family education, Joint mobilization, Stair training, Dry Needling, Electrical stimulation, Cryotherapy, Moist heat, scar mobilization, Taping, Vasopneumatic device, and Manual therapy   PLAN FOR NEXT SESSION: See what MD says; Bike, strategies for edema/motion,  manual therapy & there ex for knee ROM,  quad strengthening, standing exercises as tolerated, continue gait with SPC     Laureen Abrahams, PT, DPT 08/31/21 12:27 PM

## 2021-09-05 ENCOUNTER — Ambulatory Visit (INDEPENDENT_AMBULATORY_CARE_PROVIDER_SITE_OTHER): Payer: BC Managed Care – PPO

## 2021-09-05 ENCOUNTER — Encounter: Payer: BC Managed Care – PPO | Admitting: Physical Therapy

## 2021-09-05 ENCOUNTER — Encounter: Payer: Self-pay | Admitting: Orthopaedic Surgery

## 2021-09-05 ENCOUNTER — Ambulatory Visit (INDEPENDENT_AMBULATORY_CARE_PROVIDER_SITE_OTHER): Payer: BC Managed Care – PPO | Admitting: Orthopaedic Surgery

## 2021-09-05 DIAGNOSIS — T8482XD Fibrosis due to internal orthopedic prosthetic devices, implants and grafts, subsequent encounter: Secondary | ICD-10-CM

## 2021-09-05 DIAGNOSIS — Z96652 Presence of left artificial knee joint: Secondary | ICD-10-CM

## 2021-09-05 NOTE — Progress Notes (Signed)
The patient is now over 8 weeks status post a left total knee arthroplasty.  She is really still having a hard time with getting her knee motion back and has been very diligent with getting through physical therapy.  Their note states that they have only been able to flex her as of last week to about 74 degrees.  Her extension is almost full.  Examination of her left knee today shows the extension is almost full to flexion is to only about 70 to 75 degrees.  2 views left knee show well-seated total knee arthroplasty with no complicating features.  This point we recommended a manipulation under anesthesia.  I explained in detail what this involves and described the risk and benefits of this type of manipulation.  She understands that that is the neck step for her and agrees to proceed with this manipulation.  We will work on getting this scheduled in the near future and then I will see her back in 2 weeks as she will continue physical therapy to aggressively get her motion back.  All questions and concerns were answered and addressed.

## 2021-09-07 ENCOUNTER — Encounter: Payer: BC Managed Care – PPO | Admitting: Physical Therapy

## 2021-09-08 ENCOUNTER — Encounter: Payer: Self-pay | Admitting: Orthopaedic Surgery

## 2021-09-08 NOTE — Telephone Encounter (Signed)
See message from patient.  Thanks! ?

## 2021-09-09 NOTE — Telephone Encounter (Signed)
Emailed surgery sheet to [facility].  Thanks!

## 2021-09-09 NOTE — Telephone Encounter (Signed)
I don't normally do them for him so I don't have his stuff saved. I don't mind doing it just make sure with Sherri and I need a surgery sheet

## 2021-09-09 NOTE — Telephone Encounter (Signed)
Do you have a copy of this for her?

## 2021-09-12 ENCOUNTER — Encounter: Payer: BC Managed Care – PPO | Admitting: Physical Therapy

## 2021-09-14 ENCOUNTER — Encounter: Payer: BC Managed Care – PPO | Admitting: Physical Therapy

## 2021-09-15 ENCOUNTER — Other Ambulatory Visit: Payer: Self-pay | Admitting: Orthopaedic Surgery

## 2021-09-15 DIAGNOSIS — M24662 Ankylosis, left knee: Secondary | ICD-10-CM | POA: Diagnosis not present

## 2021-09-15 DIAGNOSIS — Z96652 Presence of left artificial knee joint: Secondary | ICD-10-CM | POA: Diagnosis not present

## 2021-09-15 DIAGNOSIS — T8482XD Fibrosis due to internal orthopedic prosthetic devices, implants and grafts, subsequent encounter: Secondary | ICD-10-CM | POA: Diagnosis not present

## 2021-09-15 DIAGNOSIS — T8482XA Fibrosis due to internal orthopedic prosthetic devices, implants and grafts, initial encounter: Secondary | ICD-10-CM | POA: Diagnosis not present

## 2021-09-15 MED ORDER — OXYCODONE HCL 5 MG PO TABS
5.0000 mg | ORAL_TABLET | Freq: Four times a day (QID) | ORAL | 0 refills | Status: DC | PRN
Start: 2021-09-15 — End: 2021-10-05

## 2021-09-16 ENCOUNTER — Ambulatory Visit (INDEPENDENT_AMBULATORY_CARE_PROVIDER_SITE_OTHER): Payer: BC Managed Care – PPO | Admitting: Rehabilitative and Restorative Service Providers"

## 2021-09-16 ENCOUNTER — Encounter: Payer: Self-pay | Admitting: Rehabilitative and Restorative Service Providers"

## 2021-09-16 DIAGNOSIS — M25662 Stiffness of left knee, not elsewhere classified: Secondary | ICD-10-CM

## 2021-09-16 DIAGNOSIS — M6281 Muscle weakness (generalized): Secondary | ICD-10-CM

## 2021-09-16 DIAGNOSIS — R262 Difficulty in walking, not elsewhere classified: Secondary | ICD-10-CM

## 2021-09-16 DIAGNOSIS — M25562 Pain in left knee: Secondary | ICD-10-CM | POA: Diagnosis not present

## 2021-09-16 DIAGNOSIS — R600 Localized edema: Secondary | ICD-10-CM | POA: Diagnosis not present

## 2021-09-16 NOTE — Therapy (Signed)
[facility] [address] [place], [place], [address] Phone: [phone]   Fax:  [phone]  Patient Details  Name: Maria Becker MRN: [id number] Date of Birth: August 14, 1960 Referring Provider:  [name]*  Encounter Date: 09/16/2021   OUTPATIENT PHYSICAL THERAPY TREATMENT NOTE   Patient Name: Maria Becker MRN: [id number] DOB:December 01, 1960, 61 y.o., female Today's Date: 09/16/2021  PCP: Celene Squibb, MD REFERRING PROVIDER: Jean Rosenthal MD  END OF SESSION:   PT End of Session - 09/16/21 1225     Visit Number 15    Number of Visits 26    Date for PT Re-Evaluation 10/21/21    Authorization Type BCBS    Authorization Time Period 25% COINSURANCE  Deductible ($2,000.00)  Deductible has been met  Out-of-Pocket Limit ($8,700.00) $6,295.64 Paid  $2,404.36 to go    Authorization - Number of Visits 30    PT Start Time 1025    PT Stop Time 1104    PT Time Calculation (min) 39 min    Activity Tolerance Patient tolerated treatment well    Behavior During Therapy [facility] for tasks assessed/performed                         Past Medical History:  Diagnosis Date   Arthritis    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 cath 07/2015: 30-50% mid-LAD stenosis but otherwise normal cors   CHF (congestive heart failure) (HCC)    Dyspnea    SOME SOB WITH EXERTION   Family history of adverse reaction to anesthesia    MOTHER SLOW TO WAKE UP   Fibromyalgia    GERD (gastroesophageal reflux disease)    Headache    History of kidney stones    Hyperlipidemia    a. not currently on statin therapy   Hypothyroidism    Insomnia    Pneumonia    HX OF AS A BABY   Past Surgical History:  Procedure Laterality Date   ABDOMINAL HYSTERECTOMY     BACK SURGERY     CARDIAC CATHETERIZATION N/A 07/20/2015   Procedure: Left Heart Cath and Coronary Angiography;  Surgeon: Belva Crome, MD;  Location:  [place] [facility];  Service: Cardiovascular;  Laterality: N/A;   CESAREAN SECTION     L4-L5 fusion     RIGHT/LEFT HEART CATH AND CORONARY ANGIOGRAPHY N/A 04/15/2021   Procedure: RIGHT/LEFT HEART CATH AND CORONARY ANGIOGRAPHY;  Surgeon: Burnell Blanks, MD;  Location: [place] [facility];  Service: Cardiovascular;  Laterality: N/A;   TOTAL KNEE ARTHROPLASTY Left 07/22/2021   Procedure: LEFT TOTAL KNEE ARTHROPLASTY;  Surgeon: [name], MD;  Location: [facility];  Service: Orthopedics;  Laterality: Left;  RFNA   Patient Active Problem List   Diagnosis Date Noted   Status post total left knee replacement 07/22/2021   Unilateral primary osteoarthritis, left knee 06/13/2021   Dyspnea on exertion    Angina decubitus ([place]) 07/20/2015   CAD (coronary artery disease), native coronary artery, LAD on cardiac CT 07/20/2015   HTN (hypertension) 07/20/2015   Family history of premature CAD 07/20/2015   Hyperlipidemia LDL goal <100 07/20/2015   Angina at rest [facility]) 07/20/2015   Abnormal nuclear stress test    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 CPM ordered. Manipulation was done 09/15/21. I have been on the CPM since the manipulation. I am so happy I could bend my knee to ride in the car.   PAIN:  Are you having pain? Yes, 2/10 Pain location: Left knee anterior more medial & posterior Pain description: achey Aggravating factors: bending knee Relieving factors: Ice  OBJECTIVE:  (objective measures completed at initial evaluation unless otherwise dated)   PATIENT SURVEYS:  07/26/2021: FOTO: 13% (predicted 43%) 08/22/2021: FOTO: 39% 10th visit   POSTURE:  07/26/2021: Forward head and shoulders, increased thoracic kyphosis   PALPATION: 07/26/2021: TTP: around joint line              EDEMA:  07/26/2021: Rt knee circumference: 42.5 centimeters        Left knee circumference: 50 centimeters     LE ROM:   Active ROM Right 07/26/2021 Left 07/26/2021 Left 08/04/21 Left 08/09/21 Left 08/22/21  Knee flexion 124 50 57 (siting) 63 (sitting) 66  Knee extension -2 -10 -17 (seated LAQ) -12 (seated LAQ) -8   (Blank rows = not tested)      Passive ROM Left 07/26/21 Left 08/03/21 Left 08/04/21 Left 08/15/21 supine Left 08/22/21 supine Left 08/26/21 Left 08/31/21  Knee flexion 58 52 (Seated) 62  (Supine) 60 70 74 74  Knee extension            -8  0 (supine) -2 -2        LE MMT:   MMT Right 07/26/2021 Left 07/26/2021  Hip flexion 5/5 5/5  Hip extension      Hip abduction 5/5 5/5  Hip adduction 5/5 5/5  Knee flexion 5/5 2/5  Knee extension 5/5 2/5   (Blank rows = not tested)     FUNCTIONAL TESTS:  07/26/2021: 5 times sit to stand: 47 seconds with RW with UE support   GAIT: 07/26/2021: Distance walked: 50 feet Assistive device utilized: Environmental consultant - 2 wheeled Level of assistance: SBA Comments: step to gait pattern with left knee immobilizer       TODAY'S TREATMENT: [facility] Adult PT Treatment:                                                DATE: 09/16/21 Therapeutic Exercise: Heel slides x 10-20 reps with 2-3 sec hold with flexion throughout treatment with and without PT overpressure or PT assist to maintain leg from sliding on bed Iso knee flexion throughout session with and without bridge to increase knee flexion with continued static hold x 1 min and/or as tolerated Discussed increasing L LE WB with gait and increasing stance time with usage of cane. Manual  Therapy: Grade 1 mobs after knee flexion bouts Modalities: Ice pack applied for 15 min during knee flexion exercise which pt states helped with the pain    08/31/21 Therex:      Aerobic: Recumbent bike seat 5 x 8 min; partial revolutions     Machines: Leg Press (seat 2) bil 87# 3x10; LLE only 50# 3x10 (1 min flexion holds x 2)  Standing: Lt foot on 6" step 5x20 sec flexion holds     Sitting: LAQ LLE 5# 3x10; with alt Rt LAQ for flexion hold Sit to/from stand 2x10 from elevated mat table  Manual: Seated PROM Lt knee flexion including LLLD stretch (pt only tolerating up to 1 min), limited tolerance to increased PROM Modalities: Vaso x 10 min, Mod pressure, 34 deg; Lt knee   08/29/21 Therex:      Aerobic: NuStep L5 x 8 min     Sitting: LAQ/knee flexion with 3 sec hold on Lt and 5# 3x10  Manual: LLLD holds in sitting for Lt knee flexion, then PROM within tolerance Modalities: Vaso Lt knee x 10 min, 34 deg, mod pressure   08/26/21 Therex:      Aerobic: Recumbent bike seat 5: partial revolutions x 8 min     Machines: Leg Press 87# 3x10, LLE only 50# 3x10 (1 min flexion hold x 2 reps) Knee extension 5# 3x10 LLE only (limited range and cues needed to push into resistance)  Manual: Seated knee flexion PROM, and with contract/relax Modalities: Vaso Lt knee x 10 min; mod pressure, 34 deg      PATIENT EDUCATION:  Education details: PT POC, HEP Person educated: Patient and Spouse Education method: Explanation, Media planner, Corporate treasurer cues, Verbal cues, and Handouts Education comprehension: verbalized understanding, returned demonstration, and needs further education     HOME EXERCISE PROGRAM: Access Code: [email or web address] URL: [email or web address] Date: 07/26/2021 Prepared by: Kearney Hard   Exercises - Seated Heel Slide  - 3 x daily - 7 x weekly - 2 sets - 10 reps - Sit to Stand with Counter Support  - 3 x daily - 7 x weekly - 10 reps - Supine Knee  Extension Strengthening  - 3 x daily - 7 x weekly - 2 sets - 10 reps - 3 seconds hold - Seated Long Arc Quad  - 3 x daily - 7 x weekly - 2 sets - 10 reps - 2-3 seconds hold - Seated Knee Flexion AAROM  - 3 x daily - 7 x weekly - 10 reps - Seated Hip Adduction Isometrics with Ball  - 3 x daily - 7 x weekly - 10 reps - 5 seconds hold   ASSESSMENT:   CLINICAL IMPRESSION: Pt presents to PT s/p L knee manipulation 09/15/21. She demonstrated 96 degrees knee flexion at beginning of treatment and 110 degrees post treatment. She had pain during activities which diminished after activity stopped. At end of treatment, she stated her pain was back down to a 2/10 as beginning of treatment. She was advised to continue functional usage of her LE throughout the home, perform her HEP, and ice and use the CPM as tolerated. She is motivated to gain her motion.    Will continue to benefit from PT to maximize function.   OBJECTIVE IMPAIRMENTS Abnormal gait, decreased activity tolerance, decreased balance, decreased endurance, decreased mobility, difficulty walking, decreased ROM, decreased strength, increased edema, impaired flexibility, obesity, and pain.    ACTIVITY LIMITATIONS cleaning, community activity, and occupation.    PERSONAL FACTORS 3+ comorbidities: CAD, CHF, arthritis, dyspnea, GERD, HA, kidney stones, h/o penumonia, hyperlipidemia  are also affecting patient's functional outcome.      REHAB POTENTIAL: Excellent   CLINICAL DECISION MAKING: Stable/uncomplicated   EVALUATION COMPLEXITY: Low     GOALS: Goals reviewed with patient? Yes Short term PT Goals (target date for Short term goals are 4 weeks 08/26/2021) Patient will demonstrate independent use of initial home exercise  program to maintain progress from in clinic treatments. Goal status: MET 08/22/2021   Long term PT goals (target dates for all long term goals are 12 weeks  10/21/2021) Patient will demonstrate/report pain at worst less than or  equal to 2/10 to facilitate minimal limitation in daily activity secondary to pain symptoms. Goal status: New   Patient will demonstrate independent use of home exercise program to facilitate ability to maintain/progress functional gains from skilled physical therapy services. Goal status: New   Patient will demonstrate FOTO outcome > or = 43 % to indicate reduced disability due to condition. Goal status: New   Pt will be able to amb with LRAD on community based surfaces safely >/= 1500 feet. Goal status: New       5.  Pt will be able to navigate stairs with single hand rail with step over step gait pattern with pain </= 2/10 in left knee.   Goal status: New       PLAN: PT FREQUENCY:  3x/ week for first 2 weeks and then drop 2x/ week for 12 weeks total.    PT DURATION: 12 weeks   PLANNED INTERVENTIONS: Therapeutic exercises, Therapeutic activity, Neuromuscular re-education, Balance training, Gait training, Patient/Family education, Joint mobilization, Stair training, Dry Needling, Electrical stimulation, Cryotherapy, Moist heat, scar mobilization, Taping, Vasopneumatic device, and Manual therapy   PLAN FOR NEXT SESSION: Bike, strategies for edema/motion,  manual therapy & there ex for knee ROM, quad strengthening, standing exercises as tolerated, continue gait with SPC    America Brown, PT 09/16/2021, 12:27 PM  [facility] [address] [place], [place], [address] Phone: [phone]   Fax:  [phone]

## 2021-09-19 ENCOUNTER — Ambulatory Visit (INDEPENDENT_AMBULATORY_CARE_PROVIDER_SITE_OTHER): Payer: BC Managed Care – PPO | Admitting: Physical Therapy

## 2021-09-19 ENCOUNTER — Encounter: Payer: Self-pay | Admitting: Physical Therapy

## 2021-09-19 DIAGNOSIS — M25662 Stiffness of left knee, not elsewhere classified: Secondary | ICD-10-CM

## 2021-09-19 DIAGNOSIS — R600 Localized edema: Secondary | ICD-10-CM

## 2021-09-19 DIAGNOSIS — R262 Difficulty in walking, not elsewhere classified: Secondary | ICD-10-CM

## 2021-09-19 DIAGNOSIS — M25562 Pain in left knee: Secondary | ICD-10-CM | POA: Diagnosis not present

## 2021-09-19 DIAGNOSIS — M6281 Muscle weakness (generalized): Secondary | ICD-10-CM

## 2021-09-19 NOTE — Therapy (Signed)
[facility] [address] [place], [place], [address] Phone: [phone]   Fax:  [phone]  Patient Details  Name: Maria Becker MRN: [id number] Date of Birth: 1960-08-11 Referring Provider:  No ref. provider found  Encounter Date: 09/19/2021   OUTPATIENT PHYSICAL THERAPY TREATMENT NOTE   Patient Name: Maria Becker MRN: [id number] DOB:03/12/1961, 61 y.o., female Today's Date: 09/19/2021  PCP: Benita Stabile, MD REFERRING PROVIDER: Doneen Poisson MD  END OF SESSION:   PT End of Session - 09/19/21 1353     Visit Number 16    Number of Visits 26    Date for PT Re-Evaluation 10/21/21    Authorization Type BCBS    Authorization Time Period 25% COINSURANCE  Deductible ($2,000.00)  Deductible has been met  Out-of-Pocket Limit ($8,700.00) $6,295.64 Paid  $2,404.36 to go    Authorization - Number of Visits 30    PT Start Time 1350    PT Stop Time 1439    PT Time Calculation (min) 49 min    Activity Tolerance Patient tolerated treatment well    Behavior During Therapy WFL for tasks assessed/performed                          Past Medical History:  Diagnosis Date   Arthritis    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 cath 07/2015: 30-50% mid-LAD stenosis but otherwise normal cors   CHF (congestive heart failure) (HCC)    Dyspnea    SOME SOB WITH EXERTION   Family history of adverse reaction to anesthesia    MOTHER SLOW TO WAKE UP   Fibromyalgia    GERD (gastroesophageal reflux disease)    Headache    History of kidney stones    Hyperlipidemia    a. not currently on statin therapy   Hypothyroidism    Insomnia    Pneumonia    HX OF AS A BABY   Past Surgical History:  Procedure Laterality Date   ABDOMINAL HYSTERECTOMY     BACK SURGERY     CARDIAC CATHETERIZATION N/A 07/20/2015   Procedure: Left Heart Cath and Coronary Angiography;  Surgeon: Lyn Records, MD;  Location:  [facility] INVASIVE [facility];  Service: Cardiovascular;  Laterality: N/A;   CESAREAN SECTION     L4-L5 fusion     RIGHT/LEFT HEART CATH AND CORONARY ANGIOGRAPHY N/A 04/15/2021   Procedure: RIGHT/LEFT HEART CATH AND CORONARY ANGIOGRAPHY;  Surgeon: Kathleene Hazel, MD;  Location: [facility] INVASIVE [facility] LAB;  Service: Cardiovascular;  Laterality: N/A;   TOTAL KNEE ARTHROPLASTY Left 07/22/2021   Procedure: LEFT TOTAL KNEE ARTHROPLASTY;  Surgeon: Kathryne Hitch, MD;  Location: [facility];  Service: Orthopedics;  Laterality: Left;  RFNA   Patient Active Problem List   Diagnosis Date Noted   Status post total left knee replacement 07/22/2021   Unilateral primary osteoarthritis, left knee 06/13/2021   Dyspnea on exertion    Angina decubitus (HCC) 07/20/2015   CAD (coronary artery disease), native coronary artery, LAD on cardiac CT 07/20/2015   HTN (hypertension) 07/20/2015   Family history of premature CAD 07/20/2015   Hyperlipidemia LDL goal <100 07/20/2015   Angina at rest [facility]) 07/20/2015   Abnormal nuclear stress test    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 Yes, 2/10 Pain location: Left knee anterior more medial & posterior Pain description: achey Aggravating factors: bending knee Relieving factors: Ice  OBJECTIVE: (objective measures completed at initial evaluation unless otherwise dated)   PATIENT SURVEYS:  07/26/2021: FOTO: 13% (predicted 43%) 08/22/2021: FOTO: 39% 10th visit   POSTURE:   07/26/2021: Forward head and shoulders, increased thoracic kyphosis   PALPATION: 07/26/2021: TTP: around joint line              EDEMA:  07/26/2021: Rt knee circumference: 42.5 centimeters        Left knee circumference: 50 centimeters     LE ROM:   Active ROM Right 07/26/2021 Left 07/26/2021 Left 08/04/21 Left 08/09/21 Left 08/22/21 Left 09/19/21  Knee flexion 124 50 57 (siting) 63 (sitting) 66 116  Knee extension -2 -10 -17 (seated LAQ) -12 (seated LAQ) -8    (Blank rows = not tested)      Passive ROM Left 07/26/21 Left 08/03/21 Left 08/04/21 Left 08/15/21 supine Left 08/22/21 supine Left 08/26/21 Left 08/31/21  Knee flexion 58 52 (Seated) 62  (Supine) 60 70 74 74  Knee extension            -8  0 (supine) -2 -2        LE MMT:   MMT Right 07/26/2021 Left 07/26/2021  Hip flexion 5/5 5/5  Hip extension      Hip abduction 5/5 5/5  Hip adduction 5/5 5/5  Knee flexion 5/5 2/5  Knee extension 5/5 2/5   (Blank rows = not tested)     FUNCTIONAL TESTS:  07/26/2021: 5 times sit to stand: 47 seconds with RW with UE support   GAIT: 07/26/2021: Distance walked: 50 feet Assistive device utilized: Environmental consultant - 2 wheeled Level of assistance: SBA Comments: step to gait pattern with left knee immobilizer       TODAY'S TREATMENT: 09/19/21 Therex:      Aerobic: NuStep L6 x 8 min; seat 9 progressing to seat 6 (moved closer 1 notch every 2 min)     Machines: Leg Press (seat 1) bil 100# 3x10; LLE only 50# 3x10     Standing: Lt forward step ups onto 4" step x10 reps Lt foot on 4" step with RLE heel taps x10 reps - heavy reliance on UEs      Sitting: LAQ LLE 5# 3x10; 5 sec hold     Supine: AA heel slides x 10 reps; 5 sec hold Modalities: Vaso x 10 min, Mod pressure, 34 deg; Lt knee  [facility] Adult PT Treatment:                                                DATE: 09/16/21 Therapeutic Exercise: Heel slides x 10-20 reps with 2-3 sec hold with flexion throughout treatment with and without  PT overpressure or PT assist to maintain leg from sliding on bed Iso knee flexion throughout session with and without bridge to increase knee flexion with continued static hold x 1 min and/or as tolerated Discussed increasing L LE WB with gait  and increasing stance time with usage of cane. Manual Therapy: Grade 1 mobs after knee flexion bouts Modalities: Ice pack applied for 15 min during knee flexion exercise which pt states helped with the pain    08/31/21 Therex:      Aerobic: Recumbent bike seat 5 x 8 min; partial revolutions     Machines: Leg Press (seat 2) bil 87# 3x10; LLE only 50# 3x10 (1 min flexion holds x 2)     Standing: Lt foot on 6" step 5x20 sec flexion holds     Sitting: LAQ LLE 5# 3x10; with alt Rt LAQ for flexion hold Sit to/from stand 2x10 from elevated mat table  Manual: Seated PROM Lt knee flexion including LLLD stretch (pt only tolerating up to 1 min), limited tolerance to increased PROM Modalities: Vaso x 10 min, Mod pressure, 34 deg; Lt knee       PATIENT EDUCATION:  Education details: PT POC, HEP Person educated: Patient and Spouse Education method: Explanation, Media planner, Corporate treasurer cues, Verbal cues, and Handouts Education comprehension: verbalized understanding, returned demonstration, and needs further education     HOME EXERCISE PROGRAM: Access Code: [email or web address] URL: [email or web address] Date: 07/26/2021 Prepared by: Kearney Hard   Exercises - Seated Heel Slide  - 3 x daily - 7 x weekly - 2 sets - 10 reps - Sit to Stand with Counter Support  - 3 x daily - 7 x weekly - 10 reps - Supine Knee Extension Strengthening  - 3 x daily - 7 x weekly - 2 sets - 10 reps - 3 seconds hold - Seated Long Arc Quad  - 3 x daily - 7 x weekly - 2 sets - 10 reps - 2-3 seconds hold - Seated Knee Flexion AAROM  - 3 x daily - 7 x weekly - 10 reps - Seated Hip Adduction Isometrics with Ball  - 3 x daily - 7 x weekly - 10 reps - 5 seconds hold    ASSESSMENT:   CLINICAL IMPRESSION: Excellent progress with flexion following her manipulation achieving 116 deg active today.  Overall progressing well with PT at this time.  Still demonstrating some quad weakness and will continue to work on strengthening in order to safely progress from RW.   OBJECTIVE IMPAIRMENTS Abnormal gait, decreased activity tolerance, decreased balance, decreased endurance, decreased mobility, difficulty walking, decreased ROM, decreased strength, increased edema, impaired flexibility, obesity, and pain.    ACTIVITY LIMITATIONS cleaning, community activity, and occupation.    PERSONAL FACTORS 3+ comorbidities: CAD, CHF, arthritis, dyspnea, GERD, HA, kidney stones, h/o penumonia, hyperlipidemia  are also affecting patient's functional outcome.      REHAB POTENTIAL: Excellent   CLINICAL DECISION MAKING: Stable/uncomplicated   EVALUATION COMPLEXITY: Low     GOALS: Goals reviewed with patient? Yes Short term PT Goals (target date for Short term goals are 4 weeks 08/26/2021) Patient will demonstrate independent use of initial home exercise program to maintain progress from in clinic treatments. Goal status: MET 08/22/2021   Long term PT goals (target dates for all long term goals are 12 weeks  10/21/2021) Patient will demonstrate/report pain at worst less than or equal to 2/10 to facilitate minimal limitation in daily activity secondary to pain symptoms. Goal status: New   Patient will demonstrate independent use of home exercise program to facilitate ability to maintain/progress functional gains from skilled physical therapy services. Goal status: New   Patient will demonstrate FOTO outcome > or = 43 % to indicate reduced disability due to condition. Goal  status: New   Pt will be able to amb with LRAD on community based surfaces safely >/= 1500 feet. Goal status: New       5.  Pt will be able to navigate stairs with single hand rail with step over step gait  pattern with pain </= 2/10 in left knee.   Goal status: New       PLAN: PT FREQUENCY:  5x/ week for first 2 weeks and then drop 2x/ week for 2-3 weeks total.    PT DURATION: 12 weeks   PLANNED INTERVENTIONS: Therapeutic exercises, Therapeutic activity, Neuromuscular re-education, Balance training, Gait training, Patient/Family education, Joint mobilization, Stair training, Dry Needling, Electrical stimulation, Cryotherapy, Moist heat, scar mobilization, Taping, Vasopneumatic device, and Manual therapy   PLAN FOR NEXT SESSION: Bike, strategies for edema/motion,  knee ROM, quad strengthening, standing exercises as tolerated, continue gait with SPC     Laureen Abrahams, PT, DPT 09/19/21 2:54 PM   [facility] [address] [place], [place], [address] Phone: [phone]   Fax:  [phone]

## 2021-09-20 ENCOUNTER — Ambulatory Visit (INDEPENDENT_AMBULATORY_CARE_PROVIDER_SITE_OTHER): Payer: BC Managed Care – PPO | Admitting: Physical Therapy

## 2021-09-20 ENCOUNTER — Encounter: Payer: Self-pay | Admitting: Physical Therapy

## 2021-09-20 DIAGNOSIS — R600 Localized edema: Secondary | ICD-10-CM

## 2021-09-20 DIAGNOSIS — M25562 Pain in left knee: Secondary | ICD-10-CM | POA: Diagnosis not present

## 2021-09-20 DIAGNOSIS — M6281 Muscle weakness (generalized): Secondary | ICD-10-CM

## 2021-09-20 DIAGNOSIS — R262 Difficulty in walking, not elsewhere classified: Secondary | ICD-10-CM

## 2021-09-20 DIAGNOSIS — M25662 Stiffness of left knee, not elsewhere classified: Secondary | ICD-10-CM | POA: Diagnosis not present

## 2021-09-20 NOTE — Therapy (Signed)
[facility] [address] [place], [place], [address] Phone: [phone]   Fax:  [phone]  Patient Details  Name: Maria Becker MRN: [id number] Date of Birth: 07/11/1960 Referring Provider:  No ref. provider found  Encounter Date: 09/20/2021   OUTPATIENT PHYSICAL THERAPY TREATMENT NOTE   Patient Name: Maria Becker MRN: [id number] DOB:April 04, 1960, 61 y.o., female Today's Date: 09/20/2021  PCP: Celene Squibb, MD REFERRING PROVIDER: Jean Rosenthal MD  END OF SESSION:   PT End of Session - 09/20/21 1014     Visit Number 17    Number of Visits 26    Date for PT Re-Evaluation 10/21/21    Authorization Type BCBS    Authorization Time Period 25% COINSURANCE  Deductible ($2,000.00)  Deductible has been met  Out-of-Pocket Limit ($8,700.00) $6,295.64 Paid  $2,404.36 to go    Authorization - Number of Visits 30    PT Start Time 1010    PT Stop Time 1105    PT Time Calculation (min) 55 min    Activity Tolerance Patient tolerated treatment well    Behavior During Therapy WFL for tasks assessed/performed                 Past Medical History:  Diagnosis Date   Arthritis    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 cath 07/2015: 30-50% mid-LAD stenosis but otherwise normal cors   CHF (congestive heart failure) (HCC)    Dyspnea    SOME SOB WITH EXERTION   Family history of adverse reaction to anesthesia    MOTHER SLOW TO WAKE UP   Fibromyalgia    GERD (gastroesophageal reflux disease)    Headache    History of kidney stones    Hyperlipidemia    a. not currently on statin therapy   Hypothyroidism    Insomnia    Pneumonia    HX OF AS A BABY   Past Surgical History:  Procedure Laterality Date   ABDOMINAL HYSTERECTOMY     BACK SURGERY     CARDIAC CATHETERIZATION N/A 07/20/2015   Procedure: Left Heart Cath and Coronary Angiography;  Surgeon: Belva Crome, MD;  Location: [facility];  Service: Cardiovascular;  Laterality: N/A;   CESAREAN SECTION     L4-L5 fusion     RIGHT/LEFT HEART CATH AND CORONARY ANGIOGRAPHY N/A 04/15/2021   Procedure: RIGHT/LEFT HEART CATH AND CORONARY ANGIOGRAPHY;  Surgeon: Burnell Blanks, MD;  Location: [facility];  Service: Cardiovascular;  Laterality: N/A;   TOTAL KNEE ARTHROPLASTY Left 07/22/2021   Procedure: LEFT TOTAL KNEE ARTHROPLASTY;  Surgeon: [name], MD;  Location: [facility];  Service: Orthopedics;  Laterality: Left;  RFNA   Patient Active Problem List   Diagnosis Date Noted   Status post total left knee replacement 07/22/2021   Unilateral primary osteoarthritis, left knee 06/13/2021   Dyspnea on exertion    Angina decubitus ([place]) 07/20/2015   CAD (coronary artery disease), native coronary artery, LAD on cardiac CT 07/20/2015   HTN (hypertension) 07/20/2015   Family history of premature CAD 07/20/2015   Hyperlipidemia LDL goal <100 07/20/2015   Angina at rest ([place]) 07/20/2015   Abnormal nuclear stress test    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 Yes, 2/10 Pain location: Left knee anterior more medial & posterior Pain description: achey Aggravating factors: bending knee Relieving factors: Ice  OBJECTIVE: (objective measures completed at initial evaluation unless otherwise dated)   PATIENT SURVEYS:  07/26/2021: FOTO: 13% (predicted 43%) 08/22/2021: FOTO: 39% 10th visit   POSTURE:  07/26/2021: Forward head and shoulders,  increased thoracic kyphosis   PALPATION: 07/26/2021: TTP: around joint line              EDEMA:  07/26/2021: Rt knee circumference: 42.5 centimeters        Left knee circumference: 50 centimeters     LE ROM:   Active ROM Right 07/26/2021 Left 07/26/2021 Left 08/04/21 Left 08/09/21 Left 08/22/21 Left 09/19/21 Left 09/20/21  Knee flexion 124 50 57 (siting) 63 (sitting) 66 116 111  Knee extension -2 -10 -17 (seated LAQ) -12 (seated LAQ) -8     (Blank rows = not tested)      Passive ROM Left 07/26/21 Left 08/03/21 Left 08/04/21 Left 08/15/21 supine Left 08/22/21 supine Left 08/26/21 Left 08/31/21  Knee flexion 58 52 (Seated) 62  (Supine) 60 70 74 74  Knee extension            -8  0 (supine) -2 -2        LE MMT:   MMT Right 07/26/2021 Left 07/26/2021  Hip flexion 5/5 5/5  Hip extension      Hip abduction 5/5 5/5  Hip adduction 5/5 5/5  Knee flexion 5/5 2/5  Knee extension 5/5 2/5   (Blank rows = not tested)     FUNCTIONAL TESTS:  07/26/2021: 5 times sit to stand: 47 seconds with RW with UE support   GAIT: 07/26/2021: Distance walked: 50 feet Assistive device utilized: Environmental consultant - 2 wheeled Level of assistance: SBA Comments: step to gait pattern with left knee immobilizer       TODAY'S TREATMENT: 09/20/21 Therex: Bike L4 x 8 min, seat 6 Leg Press 100# 3x10 bil; LLE only 50# 3x10 LAQ LLE only 7.5# 3x10 Sit to/from stand with Rt toe touch 2x10, no UE support TRX mini squat with heel raise on Rt to weight shift to Lt 2x10 Lt foot on 2" step with RLE heel taps 2x10 reps - heavy reliance on UEs  Lateral stepping to angles x 5 reps each direction AA supine heel slides x 10 reps on Lt  Modalities: Vaso x 10 min, Mod pressure, 34 deg; Lt knee  09/19/21 Therex:      Aerobic: NuStep L6 x 8 min; seat 9 progressing to seat 6 (moved closer 1 notch every 2 min)     Machines: Leg Press (seat 1) bil 100# 3x10; LLE only 50# 3x10     Standing: Lt forward step ups onto 4" step  x10 reps Lt foot on 4" step with RLE heel taps x10 reps - heavy reliance on UEs      Sitting: LAQ LLE 5# 3x10; 5 sec hold     Supine: AA heel slides x 10 reps; 5 sec hold Modalities: Vaso x 10 min, Mod pressure, 34 deg; Lt knee  [facility] Adult PT Treatment:  DATE: 09/16/21 Therapeutic Exercise: Heel slides x 10-20 reps with 2-3 sec hold with flexion throughout treatment with and without PT overpressure or PT assist to maintain leg from sliding on bed Iso knee flexion throughout session with and without bridge to increase knee flexion with continued static hold x 1 min and/or as tolerated Discussed increasing L LE WB with gait and increasing stance time with usage of cane. Manual Therapy: Grade 1 mobs after knee flexion bouts Modalities: Ice pack applied for 15 min during knee flexion exercise which pt states helped with the pain    08/31/21 Therex:      Aerobic: Recumbent bike seat 5 x 8 min; partial revolutions     Machines: Leg Press (seat 2) bil 87# 3x10; LLE only 50# 3x10 (1 min flexion holds x 2)     Standing: Lt foot on 6" step 5x20 sec flexion holds     Sitting: LAQ LLE 5# 3x10; with alt Rt LAQ for flexion hold Sit to/from stand 2x10 from elevated mat table  Manual: Seated PROM Lt knee flexion including LLLD stretch (pt only tolerating up to 1 min), limited tolerance to increased PROM Modalities: Vaso x 10 min, Mod pressure, 34 deg; Lt knee       PATIENT EDUCATION:  Education details: PT POC, HEP Person educated: Patient and Spouse Education method: Explanation, Media planner, Corporate treasurer cues, Verbal cues, and Handouts Education comprehension: verbalized understanding, returned demonstration, and needs further education     HOME EXERCISE PROGRAM: Access Code: [email or web address] URL: [email or web address] Date: 07/26/2021 Prepared by: Kearney Hard   Exercises - Seated Heel Slide  - 3 x daily - 7 x weekly - 2 sets  - 10 reps - Sit to Stand with Counter Support  - 3 x daily - 7 x weekly - 10 reps - Supine Knee Extension Strengthening  - 3 x daily - 7 x weekly - 2 sets - 10 reps - 3 seconds hold - Seated Long Arc Quad  - 3 x daily - 7 x weekly - 2 sets - 10 reps - 2-3 seconds hold - Seated Knee Flexion AAROM  - 3 x daily - 7 x weekly - 10 reps - Seated Hip Adduction Isometrics with Ball  - 3 x daily - 7 x weekly - 10 reps - 5 seconds hold   ASSESSMENT:   CLINICAL IMPRESSION: Flexion AROM continues to be excellent, and therefore agreed to decr freq to 4x/wk as we are shifting to strengthening and pt with increased soreness.  Encouraged continued flexion emphasis and quad activation at home.  Will continue to benefit from PT to maximize function.   OBJECTIVE IMPAIRMENTS Abnormal gait, decreased activity tolerance, decreased balance, decreased endurance, decreased mobility, difficulty walking, decreased ROM, decreased strength, increased edema, impaired flexibility, obesity, and pain.    ACTIVITY LIMITATIONS cleaning, community activity, and occupation.    PERSONAL FACTORS 3+ comorbidities: CAD, CHF, arthritis, dyspnea, GERD, HA, kidney stones, h/o penumonia, hyperlipidemia  are also affecting patient's functional outcome.      REHAB POTENTIAL: Excellent   CLINICAL DECISION MAKING: Stable/uncomplicated   EVALUATION COMPLEXITY: Low     GOALS: Goals reviewed with patient? Yes Short term PT Goals (target date for Short term goals are 4 weeks 08/26/2021) Patient will demonstrate independent use of initial home exercise program to maintain progress from in clinic treatments. Goal status: MET 08/22/2021   Long term PT goals (target dates for all long term goals are 12 weeks  10/21/2021) Patient will demonstrate/report pain at worst  less than or equal to 2/10 to facilitate minimal limitation in daily activity secondary to pain symptoms. Goal status: New   Patient will demonstrate independent use of home  exercise program to facilitate ability to maintain/progress functional gains from skilled physical therapy services. Goal status: New   Patient will demonstrate FOTO outcome > or = 43 % to indicate reduced disability due to condition. Goal status: New   Pt will be able to amb with LRAD on community based surfaces safely >/= 1500 feet. Goal status: New       5.  Pt will be able to navigate stairs with single hand rail with step over step gait pattern with pain </= 2/10 in left knee.   Goal status: New       PLAN: PT FREQUENCY:  5x/ week for first 2 weeks and then drop 2x/ week for 2-3 weeks total.    PT DURATION: 12 weeks   PLANNED INTERVENTIONS: Therapeutic exercises, Therapeutic activity, Neuromuscular re-education, Balance training, Gait training, Patient/Family education, Joint mobilization, Stair training, Dry Needling, Electrical stimulation, Cryotherapy, Moist heat, scar mobilization, Taping, Vasopneumatic device, and Manual therapy   PLAN FOR NEXT SESSION: maintain flexion, quad strengthening focus, Bike, standing balance activities     Laureen Abrahams, PT, DPT 09/20/21 11:11 AM   [facility] [address] [place], [place], [address] Phone: [phone]   Fax:  [phone]

## 2021-09-21 ENCOUNTER — Encounter: Payer: BC Managed Care – PPO | Admitting: Physical Therapy

## 2021-09-22 ENCOUNTER — Encounter: Payer: Self-pay | Admitting: Physical Therapy

## 2021-09-22 ENCOUNTER — Ambulatory Visit (INDEPENDENT_AMBULATORY_CARE_PROVIDER_SITE_OTHER): Payer: BC Managed Care – PPO | Admitting: Physical Therapy

## 2021-09-22 DIAGNOSIS — M25662 Stiffness of left knee, not elsewhere classified: Secondary | ICD-10-CM | POA: Diagnosis not present

## 2021-09-22 DIAGNOSIS — M6281 Muscle weakness (generalized): Secondary | ICD-10-CM

## 2021-09-22 DIAGNOSIS — M25562 Pain in left knee: Secondary | ICD-10-CM | POA: Diagnosis not present

## 2021-09-22 DIAGNOSIS — R600 Localized edema: Secondary | ICD-10-CM | POA: Diagnosis not present

## 2021-09-22 DIAGNOSIS — R262 Difficulty in walking, not elsewhere classified: Secondary | ICD-10-CM

## 2021-09-22 NOTE — Therapy (Signed)
Maria Becker, [place], [address] Phone: [phone]   Fax:  [phone]  Patient Details  Name: Maria Becker MRN: [id number] Date of Birth: Aug 04, 1960 Referring Provider:  Celene Squibb, MD  Encounter Date: 09/22/2021   OUTPATIENT PHYSICAL THERAPY TREATMENT NOTE   Patient Name: Maria Becker MRN: [id number] DOB:12/02/1960, 61 y.o., female Today's Date: 09/22/2021  PCP: Celene Squibb, MD REFERRING PROVIDER: Jean Rosenthal MD  END OF SESSION:   PT End of Session - 09/22/21 1031     Visit Number 18    Number of Visits 26    Date for PT Re-Evaluation 10/21/21    Authorization Type BCBS    Authorization Time Period 25% COINSURANCE  Deductible ($2,000.00)  Deductible has been met  Out-of-Pocket Limit ($8,700.00) $6,295.64 Paid  $2,404.36 to go    Authorization - Number of Visits 30    PT Start Time 1015    PT Stop Time 1105    PT Time Calculation (min) 50 min    Activity Tolerance Patient tolerated treatment well    Behavior During Therapy [facility] for tasks assessed/performed                 Past Medical History:  Diagnosis Date   Arthritis    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 cath 07/2015: 30-50% mid-LAD stenosis but otherwise normal cors   CHF (congestive heart failure) (HCC)    Dyspnea    SOME SOB WITH EXERTION   Family history of adverse reaction to anesthesia    MOTHER SLOW TO WAKE UP   Fibromyalgia    GERD (gastroesophageal reflux disease)    Headache    History of kidney stones    Hyperlipidemia    a. not currently on statin therapy   Hypothyroidism    Insomnia    Pneumonia    HX OF AS A BABY   Past Surgical History:  Procedure Laterality Date   ABDOMINAL HYSTERECTOMY     BACK SURGERY     CARDIAC CATHETERIZATION N/A 07/20/2015   Procedure: Left Heart Cath and Coronary Angiography;  Surgeon: Belva Crome, MD;  Location: [place] [facility];   Service: Cardiovascular;  Laterality: N/A;   CESAREAN SECTION     L4-L5 fusion     RIGHT/LEFT HEART CATH AND CORONARY ANGIOGRAPHY N/A 04/15/2021   Procedure: RIGHT/LEFT HEART CATH AND CORONARY ANGIOGRAPHY;  Surgeon: Burnell Blanks, MD;  Location: [place] [facility];  Service: Cardiovascular;  Laterality: N/A;   TOTAL KNEE ARTHROPLASTY Left 07/22/2021   Procedure: LEFT TOTAL KNEE ARTHROPLASTY;  Surgeon: [name], MD;  Location: [facility];  Service: Orthopedics;  Laterality: Left;  RFNA   Patient Active Problem List   Diagnosis Date Noted   Status post total left knee replacement 07/22/2021   Unilateral primary osteoarthritis, left knee 06/13/2021   Dyspnea on exertion    Angina decubitus ([place]) 07/20/2015   CAD (coronary artery disease), native coronary artery, LAD on cardiac CT 07/20/2015   HTN (hypertension) 07/20/2015   Family history of premature CAD 07/20/2015   Hyperlipidemia LDL goal <100 07/20/2015   Angina at rest ([place]) 07/20/2015   Abnormal nuclear stress test    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 Yes, 4/10 Pain location: Left knee anterior more medial & posterior Pain description: achey Aggravating factors: bending knee Relieving factors: Ice  OBJECTIVE: (objective measures completed at initial evaluation unless otherwise dated)   PATIENT SURVEYS:  07/26/2021: FOTO: 13% (predicted 43%) 08/22/2021: FOTO: 39% 10th visit    POSTURE:  07/26/2021: Forward head and shoulders, increased thoracic kyphosis   PALPATION: 07/26/2021: TTP: around joint line              EDEMA:  07/26/2021: Rt knee circumference: 42.5 centimeters        Left knee circumference: 50 centimeters     LE ROM:   Active ROM Right 07/26/2021 Left 07/26/2021 Left 08/04/21 Left 08/09/21 Left 08/22/21 Left 09/19/21 Left 09/20/21 Left 09/22/21  Knee flexion 124 50 57 (siting) 63 (sitting) 66 116 111 114 PROM  Knee extension -2 -10 -17 (seated LAQ) -12 (seated LAQ) -8      (Blank rows = not tested)      Passive ROM Left 07/26/21 Left 08/03/21 Left 08/04/21 Left 08/15/21 supine Left 08/22/21 supine Left 08/26/21 Left 08/31/21  Knee flexion 58 52 (Seated) 62  (Supine) 60 70 74 74  Knee extension            -8  0 (supine) -2 -2        LE MMT:   MMT Right 07/26/2021 Left 07/26/2021  Hip flexion 5/5 5/5  Hip extension      Hip abduction 5/5 5/5  Hip adduction 5/5 5/5  Knee flexion 5/5 2/5  Knee extension 5/5 2/5   (Blank rows = not tested)     FUNCTIONAL TESTS:  07/26/2021: 5 times sit to stand: 47 seconds with RW with UE support   GAIT: 07/26/2021: Distance walked: 50 feet Assistive device utilized: Environmental consultant - 2 wheeled Level of assistance: SBA Comments: step to gait pattern with left knee immobilizer       TODAY'S TREATMENT: 09/22/21 Therex: Bike L4 x 8 min, seat 5 Leg Press 100# 3x10 bil; LLE only 50# 3x10 Lateral step ups with bilat support 6 inch X 5, then 4 inch  X12 Fwd step ups with one UE support 4 inch X 5, then 6 inch X 10 LAQ LLE only 7.5# 3x10 Sit to/from stand with Rt toe touch x10, no UE support March walking 3 round trips at countertop one UE support Lateral stepping 3 round trips at countertop no UE support Tandem walk 2 round trips at counter top 1 UE support AA supine heel slides 10 sec x 10 reps on Lt  Manual therapy: PROM Lt knee flexion to tolerance  Modalities: Vaso x 10 min, Mod pressure, 34 deg;  Lt knee  09/20/21 Therex: Bike L4 x 8 min, seat 6 Leg Press 100# 3x10 bil; LLE only 50# 3x10 LAQ LLE only 7.5# 3x10 Sit to/from stand with Rt toe touch 2x10, no UE support TRX mini squat with heel raise on Rt to weight shift to Lt 2x10 Lt foot on 2" step with RLE heel taps 2x10 reps - heavy reliance on UEs  Lateral stepping to angles x 5 reps each direction AA supine heel slides x 10 reps on Lt  Modalities: Vaso x  10 min, Mod pressure, 34 deg; Lt knee  09/19/21 Therex:      Aerobic: NuStep L6 x 8 min; seat 9 progressing to seat 6 (moved closer 1 notch every 2 min)     Machines: Leg Press (seat 1) bil 100# 3x10; LLE only 50# 3x10     Standing: Lt forward step ups onto 4" step x10 reps Lt foot on 4" step with RLE heel taps x10 reps - heavy reliance on UEs      Sitting: LAQ LLE 5# 3x10; 5 sec hold     Supine: AA heel slides x 10 reps; 5 sec hold Modalities: Vaso x 10 min, Mod pressure, 34 deg; Lt knee  [facility] Adult PT Treatment:                                                DATE: 09/16/21 Therapeutic Exercise: Heel slides x 10-20 reps with 2-3 sec hold with flexion throughout treatment with and without PT overpressure or PT assist to maintain leg from sliding on bed Iso knee flexion throughout session with and without bridge to increase knee flexion with continued static hold x 1 min and/or as tolerated Discussed increasing L LE WB with gait and increasing stance time with usage of cane. Manual Therapy: Grade 1 mobs after knee flexion bouts Modalities: Ice pack applied for 15 min during knee flexion exercise which pt states helped with the pain    08/31/21 Therex:      Aerobic: Recumbent bike seat 5 x 8 min; partial revolutions     Machines: Leg Press (seat 2) bil 87# 3x10; LLE only 50# 3x10 (1 min flexion holds x 2)     Standing: Lt foot on 6" step 5x20 sec flexion holds     Sitting: LAQ LLE 5# 3x10; with alt Rt LAQ for flexion hold Sit to/from stand 2x10 from elevated  mat table  Manual: Seated PROM Lt knee flexion including LLLD stretch (pt only tolerating up to 1 min), limited tolerance to increased PROM Modalities: Vaso x 10 min, Mod pressure, 34 deg; Lt knee       PATIENT EDUCATION:  Education details: PT POC, HEP Person educated: Patient and Spouse Education method: Explanation, Media planner, Corporate treasurer cues, Verbal cues, and Handouts Education comprehension: verbalized understanding, returned demonstration, and needs further education     HOME EXERCISE PROGRAM: Access Code: [email or web address] URL: [email or web address] Date: 07/26/2021 Prepared by: Kearney Hard   Exercises - Seated Heel Slide  - 3 x daily - 7 x weekly - 2 sets - 10 reps - Sit to Stand with Counter Support  - 3 x daily - 7 x weekly - 10 reps - Supine Knee Extension Strengthening  - 3 x daily - 7 x weekly - 2 sets - 10 reps - 3 seconds hold - Seated Long Arc Quad  - 3 x daily - 7 x weekly - 2 sets - 10 reps - 2-3 seconds hold - Seated Knee Flexion AAROM  - 3 x daily - 7 x weekly - 10 reps - Seated Hip Adduction Isometrics with Ball  - 3 x daily - 7 x weekly - 10 reps - 5 seconds hold   ASSESSMENT:   CLINICAL IMPRESSION: She had good overall tolerance to session focusing on her ROM, knee strength. She is overall progressing well lately S/P MUA. She will continue to  benefit from skilled PT to progress strength, gait, and balance.    OBJECTIVE IMPAIRMENTS Abnormal gait, decreased activity tolerance, decreased balance, decreased endurance, decreased mobility, difficulty walking, decreased ROM, decreased strength, increased edema, impaired flexibility, obesity, and pain.    ACTIVITY LIMITATIONS cleaning, community activity, and occupation.    PERSONAL FACTORS 3+ comorbidities: CAD, CHF, arthritis, dyspnea, GERD, HA, kidney stones, h/o penumonia, hyperlipidemia  are also affecting patient's functional outcome.      REHAB POTENTIAL: Excellent   CLINICAL DECISION  MAKING: Stable/uncomplicated   EVALUATION COMPLEXITY: Low     GOALS: Goals reviewed with patient? Yes Short term PT Goals (target date for Short term goals are 4 weeks 08/26/2021) Patient will demonstrate independent use of initial home exercise program to maintain progress from in clinic treatments. Goal status: MET 08/22/2021   Long term PT goals (target dates for all long term goals are 12 weeks  10/21/2021) Patient will demonstrate/report pain at worst less than or equal to 2/10 to facilitate minimal limitation in daily activity secondary to pain symptoms. Goal status: New   Patient will demonstrate independent use of home exercise program to facilitate ability to maintain/progress functional gains from skilled physical therapy services. Goal status: New   Patient will demonstrate FOTO outcome > or = 43 % to indicate reduced disability due to condition. Goal status: New   Pt will be able to amb with LRAD on community based surfaces safely >/= 1500 feet. Goal status: New       5.  Pt will be able to navigate stairs with single hand rail with step over step gait pattern with pain </= 2/10 in left knee.   Goal status: New       PLAN: PT FREQUENCY:  5x/ week for first 2 weeks and then drop 2x/ week for 2-3 weeks total.    PT DURATION: 12 weeks   PLANNED INTERVENTIONS: Therapeutic exercises, Therapeutic activity, Neuromuscular re-education, Balance training, Gait training, Patient/Family education, Joint mobilization, Stair training, Dry Needling, Electrical stimulation, Cryotherapy, Moist heat, scar mobilization, Taping, Vasopneumatic device, and Manual therapy   PLAN FOR NEXT SESSION: maintain flexion, quad strengthening focus, Bike, standing balance activities and gait LRAD  Elsie Ra, PT, DPT 09/22/21 11:54 AM    [facility] [address] [place], [place], [address] Phone: [phone]   Fax:  [phone]

## 2021-09-23 ENCOUNTER — Encounter: Payer: Self-pay | Admitting: Physical Therapy

## 2021-09-23 ENCOUNTER — Ambulatory Visit (INDEPENDENT_AMBULATORY_CARE_PROVIDER_SITE_OTHER): Payer: BC Managed Care – PPO | Admitting: Physical Therapy

## 2021-09-23 DIAGNOSIS — M25662 Stiffness of left knee, not elsewhere classified: Secondary | ICD-10-CM | POA: Diagnosis not present

## 2021-09-23 DIAGNOSIS — R262 Difficulty in walking, not elsewhere classified: Secondary | ICD-10-CM

## 2021-09-23 DIAGNOSIS — M25562 Pain in left knee: Secondary | ICD-10-CM | POA: Diagnosis not present

## 2021-09-23 DIAGNOSIS — M6281 Muscle weakness (generalized): Secondary | ICD-10-CM | POA: Diagnosis not present

## 2021-09-23 DIAGNOSIS — R600 Localized edema: Secondary | ICD-10-CM

## 2021-09-23 NOTE — Therapy (Signed)
[facility] [place], [place], [address] Phone: [phone]   Fax:  [phone]  Patient Details  Name: Maria Becker MRN: [id number] Date of Birth: [birth year] Referring Provider:  Celene Squibb, MD  Encounter Date: 09/23/2021   OUTPATIENT PHYSICAL THERAPY TREATMENT NOTE   Patient Name: Maria Becker MRN: [id number] DOB:October 14, 1960, 61 y.o., female Today's Date: 09/23/2021  PCP: Celene Squibb, MD REFERRING PROVIDER: Jean Rosenthal MD  END OF SESSION:   PT End of Session - 09/23/21 1013     Visit Number 19    Number of Visits 26    Date for PT Re-Evaluation 10/21/21    Authorization Type BCBS    Authorization Time Period 25% COINSURANCE  Deductible ($2,000.00)  Deductible has been met  Out-of-Pocket Limit ($8,700.00) $6,295.64 Paid  $2,404.36 to go    Authorization - Number of Visits 30    PT Start Time 1012    PT Stop Time 1100    PT Time Calculation (min) 48 min    Activity Tolerance Patient tolerated treatment well    Behavior During Therapy [facility] for tasks assessed/performed                 Past Medical History:  Diagnosis Date   Arthritis    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 cath 07/2015: 30-50% mid-LAD stenosis but otherwise normal cors   CHF (congestive heart failure) (HCC)    Dyspnea    SOME SOB WITH EXERTION   Family history of adverse reaction to anesthesia    MOTHER SLOW TO WAKE UP   Fibromyalgia    GERD (gastroesophageal reflux disease)    Headache    History of kidney stones    Hyperlipidemia    a. not currently on statin therapy   Hypothyroidism    Insomnia    Pneumonia    HX OF AS A BABY   Past Surgical History:  Procedure Laterality Date   ABDOMINAL HYSTERECTOMY     BACK SURGERY     CARDIAC CATHETERIZATION N/A 07/20/2015   Procedure: Left Heart Cath and Coronary Angiography;  Surgeon: Belva Crome, MD;  Location: [place] [facility];   Service: Cardiovascular;  Laterality: N/A;   CESAREAN SECTION     L4-L5 fusion     RIGHT/LEFT HEART CATH AND CORONARY ANGIOGRAPHY N/A 04/15/2021   Procedure: RIGHT/LEFT HEART CATH AND CORONARY ANGIOGRAPHY;  Surgeon: Burnell Blanks, MD;  Location: [place] [facility];  Service: Cardiovascular;  Laterality: N/A;   TOTAL KNEE ARTHROPLASTY Left 07/22/2021   Procedure: LEFT TOTAL KNEE ARTHROPLASTY;  Surgeon: [name], MD;  Location: [facility];  Service: Orthopedics;  Laterality: Left;  RFNA   Patient Active Problem List   Diagnosis Date Noted   Status post total left knee replacement 07/22/2021   Unilateral primary osteoarthritis, left knee 06/13/2021   Dyspnea on exertion    Angina decubitus ([place]) 07/20/2015   CAD (coronary artery disease), native coronary artery, LAD on cardiac CT 07/20/2015   HTN (hypertension) 07/20/2015   Family history of premature CAD 07/20/2015   Hyperlipidemia LDL goal <100 07/20/2015   Angina at rest ([facility]) 07/20/2015   Abnormal nuclear stress test    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 Yes, 3/10 Pain location: Left knee anterior more medial & posterior Pain description: achey Aggravating factors: bending knee Relieving factors: Ice  OBJECTIVE: (objective measures completed at initial evaluation unless otherwise dated)   PATIENT SURVEYS:  07/26/2021: FOTO: 13% (predicted 43%) 08/22/2021: FOTO: 39% 10th visit    POSTURE:  07/26/2021: Forward head and shoulders, increased thoracic kyphosis   PALPATION: 07/26/2021: TTP: around joint line              EDEMA:  07/26/2021: Rt knee circumference: 42.5 centimeters        Left knee circumference: 50 centimeters     LE ROM:   Active ROM Right 07/26/2021 Left 07/26/2021 Left 08/04/21 Left 08/09/21 Left 08/22/21 Left 09/19/21 Left 09/20/21 Left 09/22/21 Left 09/23/21  Knee flexion 124 50 57 (siting) 63 (sitting) 66 116 111 114 PROM 121 AAROM with strap  Knee extension -2 -10 -17 (seated LAQ) -12 (seated LAQ) -8       (Blank rows = not tested)      Passive ROM Left 07/26/21 Left 08/03/21 Left 08/04/21 Left 08/15/21 supine Left 08/22/21 supine Left 08/26/21 Left 08/31/21  Knee flexion 58 52 (Seated) 62  (Supine) 60 70 74 74  Knee extension            -8  0 (supine) -2 -2        LE MMT:   MMT Right 07/26/2021 Left 07/26/2021  Hip flexion 5/5 5/5  Hip extension      Hip abduction 5/5 5/5  Hip adduction 5/5 5/5  Knee flexion 5/5 2/5  Knee extension 5/5 2/5   (Blank rows = not tested)     FUNCTIONAL TESTS:  07/26/2021: 5 times sit to stand: 47 seconds with RW with UE support   GAIT: 07/26/2021: Distance walked: 50 feet Assistive device utilized: Environmental consultant - 2 wheeled Level of assistance: SBA Comments: step to gait pattern with left knee immobilizer       TODAY'S TREATMENT: 09/23/21 Therex: Bike L4 x 10 min, seat 5 (1.4 miles) Leg Press 100# 3x10 bil; LLE only 50# 3x10 Lateral step ups with bilat support 6 inch X10 Fwd step ups with one UE support 6 inch X 10 LAQ LLE only 7.5# 3x10 Sit to stand from lowest mat surface 2X10 March walking 3 round trips at countertop one UE support Lateral stepping 3 round trips at countertop no UE support Tandem walk 2 round trips at counter top 1 UE support Retro walking 3 round trips at countertop  AA supine heel slides 5 sec x 10 reps on Lt  Modalities: Vaso x 10 min, Mod pressure, 34 deg; Lt  knee     PATIENT EDUCATION:  Education details: PT POC, HEP Person educated: Patient and Spouse Education method: Explanation, Media planner, Corporate treasurer cues, Verbal cues, and Handouts Education comprehension: verbalized understanding, returned demonstration, and needs further education     HOME EXERCISE PROGRAM: Access Code: [email or web address] URL: [email or web address] Date: 07/26/2021 Prepared by: Kearney Hard   Exercises - Seated Heel Slide  - 3 x daily - 7 x weekly - 2 sets - 10 reps - Sit to Stand with Counter Support  - 3 x daily - 7 x weekly - 10 reps -  Supine Knee Extension Strengthening  - 3 x daily - 7 x weekly - 2 sets - 10 reps - 3 seconds hold - Seated Long Arc Quad  - 3 x daily - 7 x weekly - 2 sets - 10 reps - 2-3 seconds hold - Seated Knee Flexion AAROM  - 3 x daily - 7 x weekly - 10 reps - Seated Hip Adduction Isometrics with Ball  - 3 x daily - 7 x weekly - 10 reps - 5 seconds hold   ASSESSMENT:   CLINICAL IMPRESSION: She has now met her knee flexion ROM goal! She will continue to benefit from further strengthening and activity progressions as tolerated. a   OBJECTIVE IMPAIRMENTS Abnormal gait, decreased activity tolerance, decreased balance, decreased endurance, decreased mobility, difficulty walking, decreased ROM, decreased strength, increased edema, impaired flexibility, obesity, and pain.    ACTIVITY LIMITATIONS cleaning, community activity, and occupation.    PERSONAL FACTORS 3+ comorbidities: CAD, CHF, arthritis, dyspnea, GERD, HA, kidney stones, h/o penumonia, hyperlipidemia  are also affecting patient's functional outcome.      REHAB POTENTIAL: Excellent   CLINICAL DECISION MAKING: Stable/uncomplicated   EVALUATION COMPLEXITY: Low     GOALS: Goals reviewed with patient? Yes Short term PT Goals (target date for Short term goals are 4 weeks 08/26/2021) Patient will demonstrate independent use of initial home exercise program to maintain  progress from in clinic treatments. Goal status: MET 08/22/2021   Long term PT goals (target dates for all long term goals are 12 weeks  10/21/2021) Patient will demonstrate/report pain at worst less than or equal to 2/10 to facilitate minimal limitation in daily activity secondary to pain symptoms. Goal status: New   Patient will demonstrate independent use of home exercise program to facilitate ability to maintain/progress functional gains from skilled physical therapy services. Goal status: New   Patient will demonstrate FOTO outcome > or = 43 % to indicate reduced disability due to condition. Goal status: New   Pt will be able to amb with LRAD on community based surfaces safely >/= 1500 feet. Goal status: New       5.  Pt will be able to navigate stairs with single hand rail with step over step gait pattern with pain </= 2/10 in left knee.   Goal status: New       PLAN: PT FREQUENCY:  5x/ week for first 2 weeks and then drop 2x/ week for 2-3 weeks total.    PT DURATION: 12 weeks   PLANNED INTERVENTIONS: Therapeutic exercises, Therapeutic activity, Neuromuscular re-education, Balance training, Gait training, Patient/Family education, Joint mobilization, Stair training, Dry Needling, Electrical stimulation, Cryotherapy, Moist heat, scar mobilization, Taping, Vasopneumatic device, and Manual therapy   PLAN FOR NEXT SESSION: maintain flexion, quad strengthening focus, Bike, standing balance activities and gait LRAD  Elsie Ra, PT, DPT 09/23/21 10:14 AM    [facility] [address] [place], [place], [address] Phone: [phone]   Fax:  [phone]

## 2021-09-26 ENCOUNTER — Encounter: Payer: Self-pay | Admitting: Physical Therapy

## 2021-09-26 ENCOUNTER — Ambulatory Visit (INDEPENDENT_AMBULATORY_CARE_PROVIDER_SITE_OTHER): Payer: BC Managed Care – PPO | Admitting: Physical Therapy

## 2021-09-26 DIAGNOSIS — M25562 Pain in left knee: Secondary | ICD-10-CM | POA: Diagnosis not present

## 2021-09-26 DIAGNOSIS — R600 Localized edema: Secondary | ICD-10-CM

## 2021-09-26 DIAGNOSIS — M6281 Muscle weakness (generalized): Secondary | ICD-10-CM | POA: Diagnosis not present

## 2021-09-26 DIAGNOSIS — M25662 Stiffness of left knee, not elsewhere classified: Secondary | ICD-10-CM | POA: Diagnosis not present

## 2021-09-26 DIAGNOSIS — R262 Difficulty in walking, not elsewhere classified: Secondary | ICD-10-CM

## 2021-09-26 NOTE — Therapy (Signed)
[facility] [address] [place], [place], [address] Phone: [phone]   Fax:  [phone]  Patient Details  Name: Maria Becker MRN: [id number] Date of Birth: October 28, 1960 Referring Provider:  No ref. provider found  Encounter Date: 09/26/2021   OUTPATIENT PHYSICAL THERAPY TREATMENT NOTE   Patient Name: Maria Becker MRN: [id number] DOB:10-Mar-1961, 61 y.o., female Today's Date: 09/26/2021  PCP: Benita Stabile, MD REFERRING PROVIDER: Doneen Poisson MD  END OF SESSION:   PT End of Session - 09/26/21 1253     Visit Number 20    Number of Visits 26    Date for PT Re-Evaluation 10/21/21    Authorization Type BCBS    Authorization Time Period 25% COINSURANCE  Deductible ($2,000.00)  Deductible has been met  Out-of-Pocket Limit ($8,700.00) $6,295.64 Paid  $2,404.36 to go    Authorization - Number of Visits 30    PT Start Time 1145    PT Stop Time 1235    PT Time Calculation (min) 50 min    Activity Tolerance Patient tolerated treatment well    Behavior During Therapy WFL for tasks assessed/performed                  Past Medical History:  Diagnosis Date   Arthritis    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 cath 07/2015: 30-50% mid-LAD stenosis but otherwise normal cors   CHF (congestive heart failure) (HCC)    Dyspnea    SOME SOB WITH EXERTION   Family history of adverse reaction to anesthesia    MOTHER SLOW TO WAKE UP   Fibromyalgia    GERD (gastroesophageal reflux disease)    Headache    History of kidney stones    Hyperlipidemia    a. not currently on statin therapy   Hypothyroidism    Insomnia    Pneumonia    HX OF AS A BABY   Past Surgical History:  Procedure Laterality Date   ABDOMINAL HYSTERECTOMY     BACK SURGERY     CARDIAC CATHETERIZATION N/A 07/20/2015   Procedure: Left Heart Cath and Coronary Angiography;  Surgeon: Lyn Records, MD;  Location: [facility];  Service: Cardiovascular;  Laterality: N/A;   CESAREAN SECTION     L4-L5 fusion     RIGHT/LEFT HEART CATH AND CORONARY ANGIOGRAPHY N/A 04/15/2021   Procedure: RIGHT/LEFT HEART CATH AND CORONARY ANGIOGRAPHY;  Surgeon: Kathleene Hazel, MD;  Location: [facility];  Service: Cardiovascular;  Laterality: N/A;   TOTAL KNEE ARTHROPLASTY Left 07/22/2021   Procedure: LEFT TOTAL KNEE ARTHROPLASTY;  Surgeon: Kathryne Hitch, MD;  Location: [facility];  Service: Orthopedics;  Laterality: Left;  RFNA   Patient Active Problem List   Diagnosis Date Noted   Status post total left knee replacement 07/22/2021   Unilateral primary osteoarthritis, left knee 06/13/2021   Dyspnea on exertion    Angina decubitus (HCC) 07/20/2015   CAD (coronary artery disease), native coronary artery, LAD on cardiac CT 07/20/2015   HTN (hypertension) 07/20/2015   Family history of premature CAD 07/20/2015   Hyperlipidemia LDL goal <100 07/20/2015   Angina at rest (HCC) 07/20/2015   Abnormal nuclear stress test    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 Yes, 3/10 Pain location: Left knee anterior more medial & posterior Pain description: achey Aggravating factors: bending knee Relieving factors: Ice  OBJECTIVE: (objective measures completed at initial evaluation unless otherwise dated)   PATIENT SURVEYS:  07/26/2021: FOTO: 13% (predicted 43%) 08/22/2021: FOTO: 39% 10th visit    POSTURE:  07/26/2021: Forward head and shoulders, increased thoracic kyphosis   PALPATION: 07/26/2021: TTP: around joint line              EDEMA:  07/26/2021: Rt knee circumference: 42.5 centimeters        Left knee circumference: 50 centimeters     LE ROM:   Active ROM Right 07/26/2021 Left 07/26/2021 Left 08/04/21 Left 08/09/21 Left 08/22/21 Left 09/19/21 Left 09/20/21 Left 09/22/21 Left 09/23/21 Left 09/26/21  Knee flexion 124 50 57 (siting) 63 (sitting) 66 116 111 114 PROM 121 AAROM with strap 120 With strap in supine  Knee extension -2 -10 -17 (seated LAQ) -12 (seated LAQ) -8        (Blank rows = not tested)      Passive ROM Left 07/26/21 Left 08/03/21 Left 08/04/21 Left 08/15/21 supine Left 08/22/21 supine Left 08/26/21 Left 08/31/21  Knee flexion 58 52 (Seated) 62  (Supine) 60 70 74 74  Knee extension            -8  0 (supine) -2 -2        LE MMT:   MMT Right 07/26/2021 Left 07/26/2021  Hip flexion 5/5 5/5  Hip extension      Hip abduction 5/5 5/5  Hip adduction 5/5 5/5  Knee flexion 5/5 2/5  Knee extension 5/5 2/5   (Blank rows = not tested)     FUNCTIONAL TESTS:  07/26/2021: 5 times sit to stand: 47 seconds with RW with UE support   GAIT: 07/26/2021: Distance walked: 50 feet Assistive device utilized: Environmental consultant - 2 wheeled Level of assistance: SBA Comments: step to gait pattern with left knee immobilizer       TODAY'S TREATMENT: 09/26/21 Therex: Bike L4 x 10 min, seat 4 (1.7 miles) Leg Press 106# 3x10 bil; LLE only 56# 3x10 LAQ LLE only 7.5# 3x10 AA supine heel slides 5 sec x 10 reps on Lt Gait training:  Curbs steps, slight incline in parking lot , stepping over car curb/stopper x 5, amb > 1000 around parking lot TherActivities:  Up and down 1 flight of steps  x 2 c single hand rail  and st cane c instructions for stepping down using Rt LE.   Modalities: Vaso x 10 min, Mod pressure, 34 deg; Lt knee    09/23/21 Therex: Bike L4 x 10 min, seat 5  (1.4 miles) Leg Press 10# 3x10 bil; LLE only 50# 3x10 Lateral step ups with bilat support 6 inch X10 Fwd step ups with one UE support 6 inch X 10 LAQ LLE only 7.5# 3x10 Sit to stand from lowest mat surface 2X10 March walking 3 round trips at countertop one UE support Lateral stepping 3 round trips at countertop no UE support Tandem walk 2 round trips at counter top 1 UE support Retro walking 3 round trips at countertop  AA supine heel slides 5 sec x 10  reps on Lt  Modalities: Vaso x 10 min, Mod pressure, 34 deg; Lt knee     PATIENT EDUCATION:  Education details: PT POC, HEP Person educated: Patient and Spouse Education method: Explanation, Demonstration, Corporate treasurer cues, Verbal cues, and Handouts Education comprehension: verbalized understanding, returned demonstration, and needs further education     HOME EXERCISE PROGRAM: Access Code: [email or web address] URL: [email or web address] Date: 07/26/2021 Prepared by: Kearney Hard   Exercises - Seated Heel Slide  - 3 x daily - 7 x weekly - 2 sets - 10 reps - Sit to Stand with Counter Support  - 3 x daily - 7 x weekly - 10 reps - Supine Knee Extension Strengthening  - 3 x daily - 7 x weekly - 2 sets - 10 reps - 3 seconds hold - Seated Long Arc Quad  - 3 x daily - 7 x weekly - 2 sets - 10 reps - 2-3 seconds hold - Seated Knee Flexion AAROM  - 3 x daily - 7 x weekly - 10 reps - Seated Hip Adduction Isometrics with Ball  - 3 x daily - 7 x weekly - 10 reps - 5 seconds hold   ASSESSMENT:   CLINICAL IMPRESSION: Pt has improved her AAROM to 120 degrees with left knee flexion. Pt able to navigate 1 flight of stairs with instructions in gait sequencing using her st cane for support and hand rail. Continue skilled PT to maximize pt's function and progress toward LTG's set.    OBJECTIVE IMPAIRMENTS Abnormal gait, decreased activity tolerance, decreased balance, decreased endurance, decreased mobility, difficulty walking, decreased ROM,  decreased strength, increased edema, impaired flexibility, obesity, and pain.    ACTIVITY LIMITATIONS cleaning, community activity, and occupation.    PERSONAL FACTORS 3+ comorbidities: CAD, CHF, arthritis, dyspnea, GERD, HA, kidney stones, h/o penumonia, hyperlipidemia  are also affecting patient's functional outcome.      REHAB POTENTIAL: Excellent   CLINICAL DECISION MAKING: Stable/uncomplicated   EVALUATION COMPLEXITY: Low     GOALS: Goals reviewed with patient? Yes Short term PT Goals (target date for Short term goals are 4 weeks 08/26/2021) Patient will demonstrate independent use of initial home exercise program to maintain progress from in clinic treatments. Goal status: MET 08/22/2021   Long term PT goals (target dates for all long term goals are 12 weeks  10/21/2021) Patient will demonstrate/report pain at worst less than or equal to 2/10 to facilitate minimal limitation in daily activity secondary to pain symptoms. Goal status: New   Patient will demonstrate independent use of home exercise program to facilitate ability to maintain/progress functional gains from skilled physical therapy services. Goal status: New   Patient will demonstrate FOTO outcome > or = 43 % to indicate reduced disability due to condition. Goal status: New   Pt will be able to amb with LRAD on community based surfaces safely >/= 1500 feet. Goal status: On-going 09/26/21       5.  Pt will be able to navigate stairs with single hand rail with step over step gait pattern with pain </= 2/10 in left knee.   Goal status: On-going 09/26/21       PLAN: PT FREQUENCY:  5x/ week for first 2 weeks and then drop 2x/ week for 2-3 weeks total.    PT DURATION: 12 weeks   PLANNED INTERVENTIONS: Therapeutic exercises, Therapeutic activity, Neuromuscular re-education, Balance training, Gait training, Patient/Family education, Joint mobilization, Stair training, Dry Needling, Electrical stimulation, Cryotherapy,  Moist heat, scar mobilization, Taping, Vasopneumatic device, and Manual  therapy   PLAN FOR NEXT SESSION: maintain flexion, quad strengthening focus, Bike, standing balance activities and gait LRAD   Kearney Hard, PT, MPT 09/26/21 12:55 PM   09/26/21 12:55 PM    [facility] [address] [place], [place], [address] Phone: [phone]   Fax:  [phone]

## 2021-09-27 ENCOUNTER — Encounter: Payer: Self-pay | Admitting: Physical Therapy

## 2021-09-27 ENCOUNTER — Ambulatory Visit (INDEPENDENT_AMBULATORY_CARE_PROVIDER_SITE_OTHER): Payer: BC Managed Care – PPO | Admitting: Physical Therapy

## 2021-09-27 DIAGNOSIS — R262 Difficulty in walking, not elsewhere classified: Secondary | ICD-10-CM

## 2021-09-27 DIAGNOSIS — M25562 Pain in left knee: Secondary | ICD-10-CM

## 2021-09-27 DIAGNOSIS — R600 Localized edema: Secondary | ICD-10-CM

## 2021-09-27 DIAGNOSIS — M25662 Stiffness of left knee, not elsewhere classified: Secondary | ICD-10-CM | POA: Diagnosis not present

## 2021-09-27 DIAGNOSIS — M6281 Muscle weakness (generalized): Secondary | ICD-10-CM

## 2021-09-27 NOTE — Therapy (Signed)
[facility] [address] [place], [place], [address] Phone: [phone]   Fax:  [phone]  Patient Details  Name: Maria Becker MRN: [id number] Date of Birth: Jul 20, 1960 Referring Provider:  No ref. provider found  Encounter Date: 09/27/2021   OUTPATIENT PHYSICAL THERAPY TREATMENT NOTE   Patient Name: Maria Becker MRN: [id number] DOB:September 17, 1960, 61 y.o., female Today's Date: 09/27/2021  PCP: Celene Squibb, MD REFERRING PROVIDER: Jean Rosenthal MD  END OF SESSION:   PT End of Session - 09/27/21 1029     Visit Number 21    Number of Visits 26    Date for PT Re-Evaluation 10/21/21    Authorization Type BCBS    Authorization Time Period 25% COINSURANCE  Deductible ($2,000.00)  Deductible has been met  Out-of-Pocket Limit ($8,700.00) $6,295.64 Paid  $2,404.36 to go    Authorization - Number of Visits 30    PT Start Time 1017    PT Stop Time 1105    PT Time Calculation (min) 48 min    Activity Tolerance Patient tolerated treatment well    Behavior During Therapy WFL for tasks assessed/performed                  Past Medical History:  Diagnosis Date   Arthritis    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 cath 07/2015: 30-50% mid-LAD stenosis but otherwise normal cors   CHF (congestive heart failure) (HCC)    Dyspnea    SOME SOB WITH EXERTION   Family history of adverse reaction to anesthesia    MOTHER SLOW TO WAKE UP   Fibromyalgia    GERD (gastroesophageal reflux disease)    Headache    History of kidney stones    Hyperlipidemia    a. not currently on statin therapy   Hypothyroidism    Insomnia    Pneumonia    HX OF AS A BABY   Past Surgical History:  Procedure Laterality Date   ABDOMINAL HYSTERECTOMY     BACK SURGERY     CARDIAC CATHETERIZATION N/A 07/20/2015   Procedure: Left Heart Cath and Coronary Angiography;  Surgeon: Belva Crome, MD;  Location: [facility];  Service: Cardiovascular;  Laterality: N/A;   CESAREAN SECTION     L4-L5 fusion     RIGHT/LEFT HEART CATH AND CORONARY ANGIOGRAPHY N/A 04/15/2021   Procedure: RIGHT/LEFT HEART CATH AND CORONARY ANGIOGRAPHY;  Surgeon: Burnell Blanks, MD;  Location: [facility];  Service: Cardiovascular;  Laterality: N/A;   TOTAL KNEE ARTHROPLASTY Left 07/22/2021   Procedure: LEFT TOTAL KNEE ARTHROPLASTY;  Surgeon: [name], MD;  Location: [facility];  Service: Orthopedics;  Laterality: Left;  RFNA   Patient Active Problem List   Diagnosis Date Noted   Status post total left knee replacement 07/22/2021   Unilateral primary osteoarthritis, left knee 06/13/2021   Dyspnea on exertion    Angina decubitus ([place]) 07/20/2015   CAD (coronary artery disease), native coronary artery, LAD on cardiac CT 07/20/2015   HTN (hypertension) 07/20/2015   Family history of premature CAD 07/20/2015   Hyperlipidemia LDL goal <100 07/20/2015   Angina at rest ([place]) 07/20/2015   Abnormal nuclear stress test    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 She is doing stairs now and able to water her plants  PAIN:  Are you having pain? Yes, 2/10 Pain location: Left knee anterior more medial & posterior Pain description: achey Aggravating factors: bending knee Relieving factors: Ice  OBJECTIVE: (objective measures completed at initial evaluation unless otherwise dated)   PATIENT SURVEYS:  07/26/2021: FOTO: 13% (predicted  43%) 08/22/2021: FOTO: 39% 10th visit   POSTURE:  07/26/2021: Forward head and shoulders, increased thoracic kyphosis   PALPATION: 07/26/2021: TTP: around joint line              EDEMA:  07/26/2021: Rt knee circumference: 42.5 centimeters        Left knee circumference: 50 centimeters     LE ROM:   Active ROM Right 07/26/2021 Left 07/26/2021 Left 08/04/21 Left 08/09/21 Left 08/22/21 Left 09/19/21 Left 09/20/21 Left 09/22/21 Left 09/23/21 Left 09/26/21  Knee flexion 124 50 57 (siting) 63 (sitting) 66 116 111 114 PROM 121 AAROM with strap 120 With strap in supine  Knee extension -2 -10 -17 (seated LAQ) -12 (seated LAQ) -8        (Blank rows = not tested)      Passive ROM Left 07/26/21 Left 08/03/21 Left 08/04/21 Left 08/15/21 supine Left 08/22/21 supine Left 08/26/21 Left 08/31/21  Knee flexion 58 52 (Seated) 62  (Supine) 60 70 74 74  Knee extension            -8  0 (supine) -2 -2        LE MMT:   MMT Right 07/26/2021 Left 07/26/2021  Hip flexion 5/5 5/5  Hip extension      Hip abduction 5/5 5/5  Hip adduction 5/5 5/5  Knee flexion 5/5 2/5  Knee extension 5/5 2/5   (Blank rows = not tested)     FUNCTIONAL TESTS:  07/26/2021: 5 times sit to stand: 47 seconds with RW with UE support   GAIT: 07/26/2021: Distance walked: 50 feet Assistive device utilized: Environmental consultant - 2 wheeled Level of assistance: SBA Comments: step to gait pattern with left knee immobilizer       TODAY'S TREATMENT: 09/26/21 Therex: Bike L4 x 10:30 min, seat 4 (1.79miles) Leg Press 112# 3x10 bil; LLE only 56# 3x10 Leg extension machine 5# up with both and down with left only 2X15 Seated hamstring curl 25# eccentrics 2X15 AA supine heel slides 5 sec x 10 reps on Lt  TherActivities:  March walking and retro walking 3 round trips at counter top TRX squats 2X10  Modalities: Vaso x 10 min, Mod pressure, 34 deg; Lt knee    09/23/21 Therex: Bike L4 x 10 min, seat 5 (1.4 miles) Leg Press 10# 3x10 bil;  LLE only 50# 3x10 Lateral step ups with bilat support 6 inch X10 Fwd step ups with one UE support 6 inch X 10 LAQ LLE only 7.5# 3x10 Sit to stand from lowest mat surface 2X10 March walking 3 round trips at countertop one UE support Lateral stepping 3 round trips at countertop no UE support Tandem walk 2 round trips at counter top 1 UE support Retro walking 3 round trips at countertop  AA supine heel slides 5 sec x 10 reps on Lt  Modalities: Vaso x 10 min, Mod pressure, 34 deg; Lt  knee     PATIENT EDUCATION:  Education details: PT POC, HEP Person educated: Patient and Spouse Education method: Explanation, Demonstration, Corporate treasurer cues, Verbal cues, and Handouts Education comprehension: verbalized understanding, returned demonstration, and needs further education     HOME EXERCISE PROGRAM: Access Code: [email or web address] URL: [email or web address] Date: 07/26/2021 Prepared by: Kearney Hard   Exercises - Seated Heel Slide  - 3 x daily - 7 x weekly - 2 sets - 10 reps - Sit to Stand with Counter Support  - 3 x daily - 7 x weekly - 10 reps - Supine Knee Extension Strengthening  - 3 x daily - 7 x weekly - 2 sets - 10 reps - 3 seconds hold - Seated Long Arc Quad  - 3 x daily - 7 x weekly - 2 sets - 10 reps - 2-3 seconds hold - Seated Knee Flexion AAROM  - 3 x daily - 7 x weekly - 10 reps - Seated Hip Adduction Isometrics with Ball  - 3 x daily - 7 x weekly - 10 reps - 5 seconds hold   ASSESSMENT:   CLINICAL IMPRESSION: Pt continues to improve well over last few weeks. She will continue to benefit from skilled PT to improve strength, standing activity tolerance and gait.   OBJECTIVE IMPAIRMENTS Abnormal gait, decreased activity tolerance, decreased balance, decreased endurance, decreased mobility, difficulty walking, decreased ROM, decreased strength, increased edema, impaired flexibility, obesity, and pain.    ACTIVITY LIMITATIONS cleaning, community activity, and occupation.     PERSONAL FACTORS 3+ comorbidities: CAD, CHF, arthritis, dyspnea, GERD, HA, kidney stones, h/o penumonia, hyperlipidemia  are also affecting patient's functional outcome.      REHAB POTENTIAL: Excellent   CLINICAL DECISION MAKING: Stable/uncomplicated   EVALUATION COMPLEXITY: Low     GOALS: Goals reviewed with patient? Yes Short term PT Goals (target date for Short term goals are 4 weeks 08/26/2021) Patient will demonstrate independent use of initial home exercise program to maintain progress from in clinic treatments. Goal status: MET 08/22/2021   Long term PT goals (target dates for all long term goals are 12 weeks  10/21/2021) Patient will demonstrate/report pain at worst less than or equal to 2/10 to facilitate minimal limitation in daily activity secondary to pain symptoms. Goal status: New   Patient will demonstrate independent use of home exercise program to facilitate ability to maintain/progress functional gains from skilled physical therapy services. Goal status: New   Patient will demonstrate FOTO outcome > or = 43 % to indicate reduced disability due to condition. Goal status: New   Pt will be able to amb with LRAD on community based surfaces safely >/= 1500 feet. Goal status: On-going 09/26/21       5.  Pt will be able to navigate stairs with single hand rail with step over step gait pattern with pain </= 2/10 in left knee.   Goal status: On-going 09/26/21       PLAN: PT FREQUENCY:  5x/ week for first 2 weeks and then drop 2x/ week for 2-3 weeks total.    PT DURATION: 12 weeks   PLANNED INTERVENTIONS: Therapeutic exercises, Therapeutic activity, Neuromuscular re-education, Balance training, Gait training, Patient/Family education, Joint mobilization, Stair training, Dry Needling, Electrical stimulation, Cryotherapy, Moist heat, scar mobilization, Taping, Vasopneumatic device, and Manual therapy   PLAN FOR NEXT SESSION: maintain flexion, quad strengthening focus,  Bike, standing balance activities and gait LRAD   Elsie Ra, PT, DPT 09/27/21 11:01 AM     [facility] [address] [place], [place], [address] Phone: [phone]   Fax:  [phone]

## 2021-09-28 ENCOUNTER — Encounter: Payer: BC Managed Care – PPO | Admitting: Physical Therapy

## 2021-09-29 ENCOUNTER — Encounter: Payer: BC Managed Care – PPO | Admitting: Physical Therapy

## 2021-09-30 ENCOUNTER — Encounter: Payer: Self-pay | Admitting: Physical Therapy

## 2021-09-30 ENCOUNTER — Ambulatory Visit (INDEPENDENT_AMBULATORY_CARE_PROVIDER_SITE_OTHER): Payer: BC Managed Care – PPO | Admitting: Physical Therapy

## 2021-09-30 DIAGNOSIS — R600 Localized edema: Secondary | ICD-10-CM

## 2021-09-30 DIAGNOSIS — M25662 Stiffness of left knee, not elsewhere classified: Secondary | ICD-10-CM

## 2021-09-30 DIAGNOSIS — M25562 Pain in left knee: Secondary | ICD-10-CM | POA: Diagnosis not present

## 2021-09-30 DIAGNOSIS — M6281 Muscle weakness (generalized): Secondary | ICD-10-CM

## 2021-09-30 DIAGNOSIS — R262 Difficulty in walking, not elsewhere classified: Secondary | ICD-10-CM

## 2021-09-30 NOTE — Therapy (Signed)
[facility], [place], [address] Phone: [phone]   Fax:  [phone]  Patient Details  Name: Maria Becker MRN: [id number] Date of Birth: 04-01-1960 Referring Provider:  Celene Squibb, MD  Encounter Date: 09/30/2021   OUTPATIENT PHYSICAL THERAPY TREATMENT NOTE   Patient Name: Maria Becker MRN: [id number] DOB:05-Oct-1960, 61 y.o., female Today's Date: 09/30/2021  PCP: Celene Squibb, MD REFERRING PROVIDER: Jean Rosenthal MD  END OF SESSION:   PT End of Session - 09/30/21 1009     Visit Number 22    Number of Visits 26    Date for PT Re-Evaluation 10/21/21    Authorization Type BCBS    Authorization Time Period 25% COINSURANCE  Deductible ($2,000.00)  Deductible has been met  Out-of-Pocket Limit ($8,700.00) $6,295.64 Paid  $2,404.36 to go    Authorization - Number of Visits 30    PT Start Time 1007    PT Stop Time 1058    PT Time Calculation (min) 51 min    Activity Tolerance Patient tolerated treatment well    Behavior During Therapy [facility] for tasks assessed/performed                   Past Medical History:  Diagnosis Date   Arthritis    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 cath 07/2015: 30-50% mid-LAD stenosis but otherwise normal cors   CHF (congestive heart failure) (HCC)    Dyspnea    SOME SOB WITH EXERTION   Family history of adverse reaction to anesthesia    MOTHER SLOW TO WAKE UP   Fibromyalgia    GERD (gastroesophageal reflux disease)    Headache    History of kidney stones    Hyperlipidemia    a. not currently on statin therapy   Hypothyroidism    Insomnia    Pneumonia    HX OF AS A BABY   Past Surgical History:  Procedure Laterality Date   ABDOMINAL HYSTERECTOMY     BACK SURGERY     CARDIAC CATHETERIZATION N/A 07/20/2015   Procedure: Left Heart Cath and Coronary Angiography;  Surgeon: Belva Crome, MD;  Location: [facility];   Service: Cardiovascular;  Laterality: N/A;   CESAREAN SECTION     L4-L5 fusion     RIGHT/LEFT HEART CATH AND CORONARY ANGIOGRAPHY N/A 04/15/2021   Procedure: RIGHT/LEFT HEART CATH AND CORONARY ANGIOGRAPHY;  Surgeon: Burnell Blanks, MD;  Location: [facility];  Service: Cardiovascular;  Laterality: N/A;   TOTAL KNEE ARTHROPLASTY Left 07/22/2021   Procedure: LEFT TOTAL KNEE ARTHROPLASTY;  Surgeon: [name], MD;  Location: [facility];  Service: Orthopedics;  Laterality: Left;  RFNA   Patient Active Problem List   Diagnosis Date Noted   Status post total left knee replacement 07/22/2021   Unilateral primary osteoarthritis, left knee 06/13/2021   Dyspnea on exertion    Angina decubitus ([place]) 07/20/2015   CAD (coronary artery disease), native coronary artery, LAD on cardiac CT 07/20/2015   HTN (hypertension) 07/20/2015   Family history of premature CAD 07/20/2015   Hyperlipidemia LDL goal <100 07/20/2015   Angina at rest ([place]) 07/20/2015   Abnormal nuclear stress test    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    REFERRING DIAG: E75.170 history of left knee replacement  ONSET DATE: 07/22/2021: Left TKA  THERAPY DIAG:  Acute pain of left knee  Stiffness of left knee, not elsewhere classified  Localized edema  Muscle weakness (generalized)  Difficulty in walking, not elsewhere classified  PERTINENT HISTORY: CAD, CHF, arthritis, dyspnea, GERD, HA, kidney stones, h/o penumonia, hyperlipidemia  PRECAUTIONS: None  SUBJECTIVE: took a pain pill this morning because her knee has been hurting, had to sit with it bent for several hours yesterday at a funeral and knee extension machine is still painful   PAIN:  Are you having pain? Yes, 4/10 Pain location: Left knee anterior more medial & posterior Pain description: achey Aggravating factors: bending knee Relieving factors: Ice  OBJECTIVE: (objective measures completed at initial evaluation unless otherwise  dated)   PATIENT SURVEYS:  07/26/2021: FOTO: 13% (predicted 43%) 08/22/2021: FOTO: 49% 10th visit 09/30/21: FOTO: 53%   POSTURE:  07/26/2021: Forward head and shoulders, increased thoracic kyphosis   PALPATION: 07/26/2021: TTP: around joint line              EDEMA:  07/26/2021: Rt knee circumference: 42.5 centimeters        Left knee circumference: 50 centimeters     LE ROM:   Active ROM Right 07/26/2021 Left 07/26/2021 Left 08/04/21 Left 08/09/21 Left 08/22/21 Left 09/19/21 Left 09/20/21 Left 09/22/21 Left 09/23/21 Left 09/26/21  Knee flexion 124 50 57 (siting) 63 (sitting) 66 116 111 114 PROM 121 AAROM with strap 120 With strap in supine  Knee extension -2 -10 -17 (seated LAQ) -12 (seated LAQ) -8        (Blank rows = not tested)      Passive ROM Left 07/26/21 Left 08/03/21 Left 08/04/21 Left 08/15/21 supine Left 08/22/21 supine Left 08/26/21 Left 08/31/21  Knee flexion 58 52 (Seated) 62  (Supine) 60 70 74 74  Knee extension            -8  0 (supine) -2 -2        LE MMT:   MMT Right 07/26/2021 Left 07/26/2021  Hip flexion 5/5 5/5  Hip extension      Hip abduction 5/5 5/5  Hip adduction 5/5 5/5  Knee flexion 5/5 2/5  Knee extension 5/5 2/5   (Blank rows = not tested)     FUNCTIONAL TESTS:  07/26/2021: 5 times sit to stand: 47 seconds with RW with UE support   GAIT: 07/26/2021: Distance walked: 50 feet Assistive device utilized: Environmental consultant - 2 wheeled Level of assistance: SBA Comments: step to gait pattern with left knee immobilizer       TODAY'S TREATMENT: 09/30/21 Therex: NuStep L6 x 10 min Leg Press 112# 3x10 bil; LLE only 56# 3x10 LLE on 6" step with RLE heel taps 2x10; bil UE support LLE step up onto 6" step 2x10; light UE support RDL 15# KB 3x10 Squats on ramp with 10# KB 3x10 Side stepping with L3 band at counter x 5 laps SLS on Lt: 3 Blaze Pods x 5 cycles (30 sec on/30 sec off)  Modalities: Vaso x 10 min, Mod pressure, 34 deg; Lt  knee  09/26/21 Therex: Bike L4 x 10:30 min, seat 4 (1.55mles) Leg Press 112# 3x10 bil; LLE only 56# 3x10 Leg extension machine 5# up with both and down with left only 2X15 Seated hamstring curl 25# eccentrics 2X15 AA supine heel slides 5 sec x 10 reps on Lt  TherActivities:  March walking and retro walking 3 round trips at counter top TRX squats 2X10  Modalities: Vaso x 10 min, Mod pressure, 34 deg; Lt knee    09/23/21 Therex: Bike L4 x 10 min, seat 5 (1.4 miles) Leg  Press 10# 3x10 bil; LLE only 50# 3x10 Lateral step ups with bilat support 6 inch X10 Fwd step ups with one UE support 6 inch X 10 LAQ LLE only 7.5# 3x10 Sit to stand from lowest mat surface 2X10 March walking 3 round trips at countertop one UE support Lateral stepping 3 round trips at countertop no UE support Tandem walk 2 round trips at counter top 1 UE support Retro walking 3 round trips at countertop  AA supine heel slides 5 sec x 10 reps on Lt  Modalities: Vaso x 10 min, Mod pressure, 34 deg; Lt knee     PATIENT EDUCATION:  Education details: PT POC, HEP Person educated: Patient and Spouse Education method: Explanation, Media planner, Corporate treasurer cues, Verbal cues, and Handouts Education comprehension: verbalized understanding, returned demonstration, and needs further education     HOME EXERCISE PROGRAM: Access Code: [email or web address] URL: [email or web address] Date: 07/26/2021 Prepared by: Kearney Hard   Exercises - Seated Heel Slide  - 3 x daily - 7 x weekly - 2 sets - 10 reps - Sit to Stand with Counter Support  - 3 x daily - 7 x weekly - 10 reps - Supine Knee Extension Strengthening  - 3 x daily - 7 x weekly - 2 sets - 10 reps - 3 seconds hold - Seated Long Arc Quad  - 3 x daily - 7 x weekly - 2 sets - 10 reps - 2-3 seconds hold - Seated Knee Flexion AAROM  - 3 x daily - 7 x weekly - 10 reps - Seated Hip Adduction Isometrics with Ball  - 3 x daily - 7 x weekly - 10 reps - 5 seconds  hold   ASSESSMENT:   CLINICAL IMPRESSION: Pt demonstrating excellent progress at this time and able to focus mainly on strengthening and balance as ROM continues to be excellent.  Anticipate we are nearing d/c from PT.   OBJECTIVE IMPAIRMENTS Abnormal gait, decreased activity tolerance, decreased balance, decreased endurance, decreased mobility, difficulty walking, decreased ROM, decreased strength, increased edema, impaired flexibility, obesity, and pain.    ACTIVITY LIMITATIONS cleaning, community activity, and occupation.    PERSONAL FACTORS 3+ comorbidities: CAD, CHF, arthritis, dyspnea, GERD, HA, kidney stones, h/o penumonia, hyperlipidemia  are also affecting patient's functional outcome.      REHAB POTENTIAL: Excellent   CLINICAL DECISION MAKING: Stable/uncomplicated   EVALUATION COMPLEXITY: Low     GOALS: Goals reviewed with patient? Yes Short term PT Goals (target date for Short term goals are 4 weeks 08/26/2021) Patient will demonstrate independent use of initial home exercise program to maintain progress from in clinic treatments. Goal status: MET 08/22/2021   Long term PT goals (target dates for all long term goals are 12 weeks  10/21/2021) Patient will demonstrate/report pain at worst less than or equal to 2/10 to facilitate minimal limitation in daily activity secondary to pain symptoms. Goal status: New   Patient will demonstrate independent use of home exercise program to facilitate ability to maintain/progress functional gains from skilled physical therapy services. Goal status: New   Patient will demonstrate FOTO outcome > or = 43 % to indicate reduced disability due to condition. Goal status: MET 09/30/21   Pt will be able to amb with LRAD on community based surfaces safely >/= 1500 feet. Goal status: On-going 09/26/21       5.  Pt will be able to navigate stairs with single hand rail with step over step gait pattern with pain </= 2/10 in left knee.  Goal  status: On-going 09/26/21       PLAN: PT FREQUENCY:  5x/ week for first 2 weeks and then drop 2x/ week for 2-3 weeks total.    PT DURATION: 12 weeks   PLANNED INTERVENTIONS: Therapeutic exercises, Therapeutic activity, Neuromuscular re-education, Balance training, Gait training, Patient/Family education, Joint mobilization, Stair training, Dry Needling, Electrical stimulation, Cryotherapy, Moist heat, scar mobilization, Taping, Vasopneumatic device, and Manual therapy   PLAN FOR NEXT SESSION: balance and strengthening, maintain flexion, quad strengthening focus, Bike, standing balance activities, will need MD note next week    Laureen Abrahams, PT, DPT 09/30/21 10:53 AM     [facility] [address] [place], [place], [address] Phone: [phone]   Fax:  [phone]

## 2021-10-03 ENCOUNTER — Encounter: Payer: BC Managed Care – PPO | Admitting: Orthopaedic Surgery

## 2021-10-03 ENCOUNTER — Ambulatory Visit (INDEPENDENT_AMBULATORY_CARE_PROVIDER_SITE_OTHER): Payer: BC Managed Care – PPO | Admitting: Physical Therapy

## 2021-10-03 ENCOUNTER — Encounter: Payer: Self-pay | Admitting: Physical Therapy

## 2021-10-03 DIAGNOSIS — M6281 Muscle weakness (generalized): Secondary | ICD-10-CM

## 2021-10-03 DIAGNOSIS — R262 Difficulty in walking, not elsewhere classified: Secondary | ICD-10-CM

## 2021-10-03 DIAGNOSIS — E559 Vitamin D deficiency, unspecified: Secondary | ICD-10-CM | POA: Diagnosis not present

## 2021-10-03 DIAGNOSIS — M25562 Pain in left knee: Secondary | ICD-10-CM | POA: Diagnosis not present

## 2021-10-03 DIAGNOSIS — M25662 Stiffness of left knee, not elsewhere classified: Secondary | ICD-10-CM | POA: Diagnosis not present

## 2021-10-03 DIAGNOSIS — E785 Hyperlipidemia, unspecified: Secondary | ICD-10-CM | POA: Diagnosis not present

## 2021-10-03 DIAGNOSIS — R600 Localized edema: Secondary | ICD-10-CM

## 2021-10-03 DIAGNOSIS — E039 Hypothyroidism, unspecified: Secondary | ICD-10-CM | POA: Diagnosis not present

## 2021-10-03 DIAGNOSIS — R7303 Prediabetes: Secondary | ICD-10-CM | POA: Diagnosis not present

## 2021-10-03 NOTE — Therapy (Signed)
[facility] [address] [place], [place], [address] Phone: [phone]   Fax:  [phone]  Patient Details  Name: Maria Becker MRN: [id number] Date of Birth: 04/22/1960 Referring Provider:  No ref. provider found  Encounter Date: 10/03/2021   OUTPATIENT PHYSICAL THERAPY TREATMENT NOTE   Patient Name: Maria Becker MRN: [id number] DOB:1961/02/10, 61 y.o., female Today's Date: 10/03/2021  PCP: Celene Squibb, MD REFERRING PROVIDER: Jean Rosenthal MD  END OF SESSION:   PT End of Session - 10/03/21 1147     Visit Number 23    Number of Visits 26    Date for PT Re-Evaluation 10/21/21    Authorization Type BCBS    Authorization Time Period 25% COINSURANCE  Deductible ($2,000.00)  Deductible has been met  Out-of-Pocket Limit ($8,700.00) $6,295.64 Paid  $2,404.36 to go    Authorization - Number of Visits 30    PT Start Time 1145    PT Stop Time 1230    PT Time Calculation (min) 45 min    Activity Tolerance Patient tolerated treatment well    Behavior During Therapy WFL for tasks assessed/performed                    Past Medical History:  Diagnosis Date   Arthritis    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 cath 07/2015: 30-50% mid-LAD stenosis but otherwise normal cors   CHF (congestive heart failure) (HCC)    Dyspnea    SOME SOB WITH EXERTION   Family history of adverse reaction to anesthesia    MOTHER SLOW TO WAKE UP   Fibromyalgia    GERD (gastroesophageal reflux disease)    Headache    History of kidney stones    Hyperlipidemia    a. not currently on statin therapy   Hypothyroidism    Insomnia    Pneumonia    HX OF AS A BABY   Past Surgical History:  Procedure Laterality Date   ABDOMINAL HYSTERECTOMY     BACK SURGERY     CARDIAC CATHETERIZATION N/A 07/20/2015   Procedure: Left Heart Cath and Coronary Angiography;  Surgeon: Belva Crome, MD;  Location: [place]  [facility];  Service: Cardiovascular;  Laterality: N/A;   CESAREAN SECTION     L4-L5 fusion     RIGHT/LEFT HEART CATH AND CORONARY ANGIOGRAPHY N/A 04/15/2021   Procedure: RIGHT/LEFT HEART CATH AND CORONARY ANGIOGRAPHY;  Surgeon: Burnell Blanks, MD;  Location: [place] [facility];  Service: Cardiovascular;  Laterality: N/A;   TOTAL KNEE ARTHROPLASTY Left 07/22/2021   Procedure: LEFT TOTAL KNEE ARTHROPLASTY;  Surgeon: [name], MD;  Location: [facility];  Service: Orthopedics;  Laterality: Left;  RFNA   Patient Active Problem List   Diagnosis Date Noted   Status post total left knee replacement 07/22/2021   Unilateral primary osteoarthritis, left knee 06/13/2021   Dyspnea on exertion    Angina decubitus ([place]) 07/20/2015   CAD (coronary artery disease), native coronary artery, LAD on cardiac CT 07/20/2015   HTN (hypertension) 07/20/2015   Family history of premature CAD 07/20/2015   Hyperlipidemia LDL goal <100 07/20/2015   Angina at rest ([place]) 07/20/2015   Abnormal nuclear stress test    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 Yes, 2/10 Pain location: Left knee anterior more medial & posterior Pain description: achey Aggravating factors: bending knee Relieving factors: Ice  OBJECTIVE: (objective measures completed at initial evaluation unless otherwise dated)   PATIENT SURVEYS:   07/26/2021: FOTO: 13% (predicted 43%) 08/22/2021: FOTO: 49% 10th visit 09/30/21: FOTO: 53%   POSTURE:  07/26/2021: Forward head and shoulders, increased thoracic kyphosis   PALPATION: 07/26/2021: TTP: around joint line              EDEMA:  07/26/2021: Rt knee circumference: 42.5 centimeters        Left knee circumference: 50 centimeters     LE ROM:   Active ROM Right 07/26/2021 Left 07/26/2021 Left 08/04/21 Left 08/09/21 Left 08/22/21 Left 09/19/21 Left 09/20/21 Left 09/22/21 Left 09/23/21 Left 09/26/21 Left 10/03/21  Knee flexion 124 50 57 (siting) 63 (sitting) 66 116 111 114 PROM 121 AAROM with strap 120 With strap in supine 122  Knee extension -2 -10 -17 (seated LAQ) -12 (seated LAQ) -8      -9 (seated LAQ)   (Blank rows = not tested)      Passive ROM Left 07/26/21 Left 08/03/21 Left 08/04/21 Left 08/15/21 supine Left 08/22/21 supine Left 08/26/21 Left 08/31/21  Knee flexion 58 52 (Seated) 62  (Supine) 60 70 74 74  Knee extension            -8  0 (supine) -2 -2        LE MMT:   MMT Right 07/26/2021 Left 07/26/2021  Hip flexion 5/5 5/5  Hip extension      Hip abduction 5/5 5/5  Hip adduction 5/5 5/5  Knee flexion 5/5 2/5  Knee extension 5/5 2/5   (Blank rows = not tested)     FUNCTIONAL TESTS:  07/26/2021: 5 times sit to stand: 47 seconds with RW with UE support   GAIT: 07/26/2021: Distance walked: 50 feet Assistive device utilized: Environmental consultant - 2 wheeled Level of assistance: SBA Comments: step to gait pattern with left knee immobilizer       TODAY'S TREATMENT: 10/03/21 Therex: Bike L4 x 10  min Leg Press 112# 3x10 bil; single limb 56# 3x10 bil LLE LAQ 3x10 5# Bridges 2x10  Modalities: Vaso x 10 min, Mod pressure, 34 deg; Lt knee  09/30/21 Therex: NuStep L6 x 10 min Leg Press 112# 3x10 bil; LLE only 56# 3x10 LLE on 6" step with RLE heel taps 2x10; bil UE support LLE step up onto 6" step 2x10; light UE support RDL 15# KB 3x10 Squats on ramp with 10# KB  3x10 Side stepping with L3 band at counter x 5 laps SLS on Lt: 3 Blaze Pods x 5 cycles (30 sec on/30 sec off)  Modalities: Vaso x 10 min, Mod pressure, 34 deg; Lt knee  09/26/21 Therex: Bike L4 x 10:30 min, seat 4 (1.81mles) Leg Press 112# 3x10 bil; LLE only 56# 3x10 Leg extension machine 5# up with both and down with left only 2X15 Seated hamstring curl 25# eccentrics 2X15 AA supine heel slides 5 sec x 10 reps on Lt  TherActivities:  March walking and retro walking 3 round trips  at counter top TRX squats 2X10  Modalities: Vaso x 10 min, Mod pressure, 34 deg; Lt knee    09/23/21 Therex: Bike L4 x 10 min, seat 5 (1.4 miles) Leg Press 10# 3x10 bil; LLE only 50# 3x10 Lateral step ups with bilat support 6 inch X10 Fwd step ups with one UE support 6 inch X 10 LAQ LLE only 7.5# 3x10 Sit to stand from lowest mat surface 2X10 March walking 3 round trips at countertop one UE support Lateral stepping 3 round trips at countertop no UE support Tandem walk 2 round trips at counter top 1 UE support Retro walking 3 round trips at countertop  AA supine heel slides 5 sec x 10 reps on Lt  Modalities: Vaso x 10 min, Mod pressure, 34 deg; Lt knee     PATIENT EDUCATION:  Education details: PT POC, HEP Person educated: Patient and Spouse Education method: Explanation, Media planner, Corporate treasurer cues, Verbal cues, and Handouts Education comprehension: verbalized understanding, returned demonstration, and needs further education     HOME EXERCISE PROGRAM: Access Code: [email or web address] URL: [email or web address] Date: 07/26/2021 Prepared by: Kearney Hard   Exercises - Seated Heel Slide  - 3 x daily - 7 x weekly - 2 sets - 10 reps - Sit to Stand with Counter Support  - 3 x daily - 7 x weekly - 10 reps - Supine Knee Extension Strengthening  - 3 x daily - 7 x weekly - 2 sets - 10 reps - 3 seconds hold - Seated Long Arc Quad  - 3 x daily - 7 x weekly - 2 sets - 10 reps - 2-3 seconds  hold - Seated Knee Flexion AAROM  - 3 x daily - 7 x weekly - 10 reps - Seated Hip Adduction Isometrics with Ball  - 3 x daily - 7 x weekly - 10 reps - 5 seconds hold   ASSESSMENT:   CLINICAL IMPRESSION: Excellent improvement in flexion since manipulation, and overall progressing well.  She's not walking with AD today in clinic.  Will see what MD says, likely d/c next week or so.     OBJECTIVE IMPAIRMENTS Abnormal gait, decreased activity tolerance, decreased balance, decreased endurance, decreased mobility, difficulty walking, decreased ROM, decreased strength, increased edema, impaired flexibility, obesity, and pain.    ACTIVITY LIMITATIONS cleaning, community activity, and occupation.    PERSONAL FACTORS 3+ comorbidities: CAD, CHF, arthritis, dyspnea, GERD, HA, kidney stones, h/o penumonia, hyperlipidemia  are also affecting patient's functional outcome.      REHAB POTENTIAL: Excellent   CLINICAL DECISION MAKING: Stable/uncomplicated   EVALUATION COMPLEXITY: Low     GOALS: Goals reviewed with patient? Yes Short term PT Goals (target date for Short term goals are 4 weeks 08/26/2021) Patient will demonstrate independent use of initial home exercise program to maintain progress from in clinic treatments. Goal status: MET 08/22/2021   Long term PT goals (target dates for all long term goals are 12 weeks  10/21/2021) Patient will demonstrate/report pain at worst less than or equal to 2/10 to facilitate minimal limitation in daily activity secondary to pain symptoms. Goal status: New   Patient will demonstrate independent use of home exercise program to facilitate ability to maintain/progress functional gains from skilled physical therapy services. Goal status: New   Patient will demonstrate FOTO outcome > or = 43 % to indicate reduced disability due to condition. Goal status: MET 09/30/21   Pt will be able to amb with LRAD on community based surfaces safely >/= 1500 feet. Goal  status: On-going 09/26/21       5.  Pt will be able to navigate stairs with single hand rail with step over step gait pattern with pain </= 2/10 in left knee.   Goal status: On-going 09/26/21       PLAN: PT FREQUENCY:  5x/ week for first 2 weeks and then drop 2x/ week for 2-3 weeks total.    PT DURATION: 12 weeks   PLANNED INTERVENTIONS: Therapeutic exercises, Therapeutic activity, Neuromuscular re-education, Balance training, Gait training, Patient/Family education, Joint mobilization, Stair training, Dry Needling, Electrical stimulation, Cryotherapy, Moist heat, scar mobilization, Taping, Vasopneumatic device, and Manual therapy   PLAN FOR NEXT SESSION: balance and strengthening, quad focus, Bike, standing balance activities, will need MD note next week    Laureen Abrahams, PT, DPT 10/03/21 12:23 PM     [facility] [address] [place], [place], [address] Phone: [phone]   Fax:  [phone]

## 2021-10-05 ENCOUNTER — Encounter: Payer: Self-pay | Admitting: Orthopaedic Surgery

## 2021-10-05 ENCOUNTER — Encounter: Payer: Self-pay | Admitting: Physical Therapy

## 2021-10-05 ENCOUNTER — Ambulatory Visit (INDEPENDENT_AMBULATORY_CARE_PROVIDER_SITE_OTHER): Payer: BC Managed Care – PPO | Admitting: Orthopaedic Surgery

## 2021-10-05 ENCOUNTER — Ambulatory Visit (INDEPENDENT_AMBULATORY_CARE_PROVIDER_SITE_OTHER): Payer: BC Managed Care – PPO | Admitting: Physical Therapy

## 2021-10-05 DIAGNOSIS — M6281 Muscle weakness (generalized): Secondary | ICD-10-CM | POA: Diagnosis not present

## 2021-10-05 DIAGNOSIS — M25662 Stiffness of left knee, not elsewhere classified: Secondary | ICD-10-CM | POA: Diagnosis not present

## 2021-10-05 DIAGNOSIS — T8482XD Fibrosis due to internal orthopedic prosthetic devices, implants and grafts, subsequent encounter: Secondary | ICD-10-CM

## 2021-10-05 DIAGNOSIS — R600 Localized edema: Secondary | ICD-10-CM | POA: Diagnosis not present

## 2021-10-05 DIAGNOSIS — M25562 Pain in left knee: Secondary | ICD-10-CM | POA: Diagnosis not present

## 2021-10-05 DIAGNOSIS — Z96652 Presence of left artificial knee joint: Secondary | ICD-10-CM

## 2021-10-05 DIAGNOSIS — R262 Difficulty in walking, not elsewhere classified: Secondary | ICD-10-CM

## 2021-10-05 MED ORDER — TEMAZEPAM 7.5 MG PO CAPS: 7.5000 mg | ORAL_CAPSULE | Freq: Every evening | ORAL | 0 refills | Status: DC | PRN

## 2021-10-05 NOTE — Therapy (Signed)
[facility] [address] [place], [place], [address] Phone: [phone]   Fax:  [phone]    OUTPATIENT PHYSICAL THERAPY TREATMENT NOTE DISCHARGE SUMMARY   Patient Name: Maria Becker MRN: [id number] DOB:19-Jun-1960, 61 y.o., female Today's Date: 10/05/2021  PCP: Celene Squibb, MD REFERRING PROVIDER: Jean Rosenthal MD  END OF SESSION:   PT End of Session - 10/05/21 1144     Visit Number 24    Date for PT Re-Evaluation 10/21/21    Authorization Type BCBS    Authorization Time Period 25% COINSURANCE  Deductible ($2,000.00)  Deductible has been met  Out-of-Pocket Limit ($8,700.00) $6,295.64 Paid  $2,404.36 to go    Authorization - Number of Visits 30    PT Start Time 1120    PT Stop Time 1159    PT Time Calculation (min) 39 min    Activity Tolerance Patient tolerated treatment well    Behavior During Therapy WFL for tasks assessed/performed                     Past Medical History:  Diagnosis Date   Arthritis    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 cath 07/2015: 30-50% mid-LAD stenosis but otherwise normal cors   CHF (congestive heart failure) (HCC)    Dyspnea    SOME SOB WITH EXERTION   Family history of adverse reaction to anesthesia    MOTHER SLOW TO WAKE UP   Fibromyalgia    GERD (gastroesophageal reflux disease)    Headache    History of kidney stones    Hyperlipidemia    a. not currently on statin therapy   Hypothyroidism    Insomnia    Pneumonia    HX OF AS A BABY   Past Surgical History:  Procedure Laterality Date   ABDOMINAL HYSTERECTOMY     BACK SURGERY     CARDIAC CATHETERIZATION N/A 07/20/2015   Procedure: Left Heart Cath and Coronary Angiography;  Surgeon: Belva Crome, MD;  Location: [place] [facility];  Service: Cardiovascular;  Laterality: N/A;   CESAREAN SECTION     L4-L5 fusion     RIGHT/LEFT HEART CATH AND CORONARY ANGIOGRAPHY N/A 04/15/2021    Procedure: RIGHT/LEFT HEART CATH AND CORONARY ANGIOGRAPHY;  Surgeon: Burnell Blanks, MD;  Location: [place] [facility];  Service: Cardiovascular;  Laterality: N/A;   TOTAL KNEE ARTHROPLASTY Left 07/22/2021   Procedure: LEFT TOTAL KNEE ARTHROPLASTY;  Surgeon: [name], MD;  Location: [facility];  Service: Orthopedics;  Laterality: Left;  RFNA   Patient Active Problem List   Diagnosis Date Noted   Status post total left knee replacement 07/22/2021   Unilateral primary osteoarthritis, left knee 06/13/2021   Dyspnea on exertion    Angina decubitus ([place]) 07/20/2015   CAD (coronary artery disease), native coronary artery, LAD on cardiac CT 07/20/2015   HTN (hypertension) 07/20/2015   Family history of premature CAD 07/20/2015   Hyperlipidemia LDL goal <100 07/20/2015   Angina at rest [facility]) 07/20/2015   Abnormal nuclear stress test    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 Yes, 2/10 Pain location: Left knee anterior more medial & posterior Pain description: achey Aggravating factors: bending knee Relieving factors: Ice  OBJECTIVE: (objective measures completed at initial evaluation unless otherwise dated)   PATIENT SURVEYS:  07/26/2021: FOTO: 13% (predicted 43%) 08/22/2021: FOTO: 49% 10th visit 09/30/21: FOTO: 53%   POSTURE:  07/26/2021: Forward head and shoulders, increased thoracic kyphosis   PALPATION: 07/26/2021:  TTP: around joint line              EDEMA:  07/26/2021: Rt knee circumference: 42.5 centimeters        Left knee circumference: 50 centimeters     LE ROM:   Active ROM Right 07/26/2021 Left 07/26/2021 Left 08/04/21 Left 08/09/21 Left 08/22/21 Left 09/19/21 Left 09/20/21 Left 09/22/21 Left 09/23/21 Left 09/26/21 Left 10/03/21  Knee flexion 124 50 57 (siting) 63 (sitting) 66 116 111 114 PROM 121 AAROM with strap 120 With strap in supine 122  Knee extension -2 -10 -17 (seated LAQ) -12 (seated LAQ) -8      -9 (seated LAQ)   (Blank rows = not tested)      Passive ROM Left 07/26/21 Left 08/03/21 Left 08/04/21 Left 08/15/21 supine Left 08/22/21 supine Left 08/26/21 Left 08/31/21  Knee flexion 58 52 (Seated) 62  (Supine) 60 70 74 74  Knee extension            -8  0 (supine) -2 -2        LE MMT:   MMT Right 07/26/2021 Left 07/26/2021  Hip flexion 5/5 5/5  Hip extension      Hip abduction 5/5 5/5  Hip adduction 5/5 5/5  Knee flexion 5/5 2/5  Knee extension 5/5 2/5   (Blank rows = not tested)     FUNCTIONAL TESTS:  07/26/2021: 5 times sit to stand: 47 seconds with RW with UE support   GAIT: 07/26/2021: Distance walked: 50 feet Assistive device utilized: Environmental consultant - 2 wheeled Level of assistance: SBA Comments: step to gait pattern with left knee immobilizer       TODAY'S TREATMENT: 10/05/21 Therex: Bike L4 x 10  min Leg Press 112# 3x10 bil; single limb 56# 3x10 bil LAQ 5# 3x10; LLE Squats on ramp 10# KB 3x10 Deadlifts with 10# KB 3x10  Gait: Negotiated ramp/curb independently Negotiated stairs with 1 handrail reciprocally modified independent  10/03/21 Therex: Bike L4 x 10  min Leg Press 112# 3x10 bil; single limb 56# 3x10 bil LLE LAQ 3x10 5# Bridges 2x10  Modalities: Vaso x 10 min, Mod pressure, 34 deg; Lt knee  09/30/21 Therex: NuStep L6 x 10 min Leg Press 112# 3x10 bil; LLE only 56# 3x10 LLE on 6" step with RLE heel taps 2x10; bil UE support LLE step up onto  6" step 2x10; light UE support RDL 15# KB 3x10 Squats on ramp with 10# KB 3x10 Side stepping with L3 band at counter x 5 laps SLS on Lt: 3 Blaze Pods x 5 cycles (30 sec on/30 sec off)  Modalities: Vaso x 10 min, Mod pressure, 34 deg; Lt knee   PATIENT EDUCATION:  Education details: PT POC, HEP Person educated: Patient and Spouse Education method: Consulting civil engineer, Media planner, Corporate treasurer cues, Verbal cues, and Handouts Education comprehension: verbalized understanding, returned demonstration, and needs further education     HOME EXERCISE PROGRAM: Access Code:  [email or web address] URL: [email or web address] Date: 10/05/2021 Prepared by: Faustino Congress  Exercises - Sit to Stand with Counter Support  - 3 x daily - 7 x weekly - 10 reps - Seated Long Arc Quad  - 3 x daily - 7 x weekly - 2 sets - 10 reps - 2-3 seconds hold - Forward Lunge with Back Leg Straight and Counter Support  - 2 x daily - 7 x weekly - 3 reps - 10-20 seconds hold - Kettlebell Squat  - 1 x daily - 7 x weekly - 3 sets - 10 reps - Half Deadlift with Kettlebell  - 1 x daily - 7 x weekly - 3 sets - 10 reps   ASSESSMENT:   CLINICAL IMPRESSION: Pt has met all goals and is ready for d/c from PT.  At this time she will continue community based exercises and continue to increase activity as tolerated.  She will reach out if she feels she needs to return to PT.   OBJECTIVE IMPAIRMENTS Abnormal gait, decreased activity tolerance, decreased balance, decreased endurance, decreased mobility, difficulty walking, decreased ROM, decreased strength, increased edema, impaired flexibility, obesity, and pain.    ACTIVITY LIMITATIONS cleaning, community activity, and occupation.    PERSONAL FACTORS 3+ comorbidities: CAD, CHF, arthritis, dyspnea, GERD, HA, kidney stones, h/o penumonia, hyperlipidemia  are also affecting patient's functional outcome.      REHAB POTENTIAL: Excellent   CLINICAL DECISION MAKING: Stable/uncomplicated    EVALUATION COMPLEXITY: Low     GOALS: Goals reviewed with patient? Yes Short term PT Goals (target date for Short term goals are 4 weeks 08/26/2021) Patient will demonstrate independent use of initial home exercise program to maintain progress from in clinic treatments. Goal status: MET 08/22/2021   Long term PT goals (target dates for all long term goals are 12 weeks  10/21/2021) Patient will demonstrate/report pain at worst less than or equal to 2/10 to facilitate minimal limitation in daily activity secondary to pain symptoms. Goal status: MET 10/05/21   Patient will demonstrate independent use of home exercise program to facilitate ability to maintain/progress functional gains from skilled physical therapy services. Goal status: MET 10/05/21   Patient will demonstrate FOTO outcome > or = 43 % to indicate reduced disability due to condition. Goal status: MET 09/30/21   Pt will be able to amb with LRAD on community based surfaces safely >/= 1500 feet. Goal status: MET 10/05/21       5.  Pt will be able to navigate stairs with single hand rail with step over step gait pattern with pain </= 2/10 in left knee.   Goal status: MET 10/05/21       PLAN: PT FREQUENCY:  5x/ week for first 2 weeks and then drop 2x/ week for 2-3 weeks total.    PT DURATION: 12 weeks   PLANNED INTERVENTIONS: Therapeutic exercises, Therapeutic activity, Neuromuscular re-education, Balance training, Gait training, Patient/Family education, Joint mobilization, Stair training, Dry Needling, Electrical stimulation, Cryotherapy, Moist heat, scar mobilization, Taping, Vasopneumatic device, and Manual therapy   PLAN FOR NEXT SESSION: D/C PT today     Laureen Abrahams, PT, DPT 10/05/21 12:12 PM     [facility] [address] [place], [place], [address] Phone: [phone]   Fax:  [phone]     PHYSICAL THERAPY DISCHARGE SUMMARY  Visits from Start of Care:  26  Current functional level related to goals / functional outcomes: See above   Remaining deficits: See above   Education / Equipment:  HEP   Patient agrees to discharge. Patient goals were met. Patient is being discharged due to meeting the stated rehab goals.  Laureen Abrahams, PT, DPT 10/05/21 12:13 PM  [facility] [address] [place], [place], [address] Phone: [phone]   Fax:  [phone]

## 2021-10-05 NOTE — Progress Notes (Signed)
HPI: Ms. Labarbera comes in today status post left knee manipulation under anesthesia 09/15/2021.  Total knee was performed on 07/22/2021.  She is going to formal therapy and has been in the knee to approximately 122 degrees.  She has obtained a recumbent bike at home and is using it.  She is also using CPM at home.  States knee is stiff but she feels like she is making progress daily.  Main complaint is she is having difficulty sleeping despite the fact that she does not nap during the day.  Review of systems see HPI otherwise negative or noncontributory.  Physical exam: Left knee: Full extension full flexion.  No instability valgus varus stressing.  Calf minimal tenderness.  Ambulates without any assistive device.  Impression: Status post left knee manipulation under anesthesia Left total knee arthroplasty Insomnia  Plan: She will continue to work on range of motion strengthening of the knee.  Discussed knee family exercises with her.  Regards to her insomnia we will place her on Restoril.  We discussed good sleep hygiene with her.  She will follow-up with [place] in a month sooner if there is any questions or concerns.

## 2021-10-10 ENCOUNTER — Encounter: Payer: BC Managed Care – PPO | Admitting: Physical Therapy

## 2021-10-10 ENCOUNTER — Other Ambulatory Visit (HOSPITAL_COMMUNITY): Payer: Self-pay | Admitting: Family Medicine

## 2021-10-10 DIAGNOSIS — Z1231 Encounter for screening mammogram for malignant neoplasm of breast: Secondary | ICD-10-CM

## 2021-10-10 DIAGNOSIS — F411 Generalized anxiety disorder: Secondary | ICD-10-CM | POA: Diagnosis not present

## 2021-10-10 DIAGNOSIS — E039 Hypothyroidism, unspecified: Secondary | ICD-10-CM | POA: Diagnosis not present

## 2021-10-10 DIAGNOSIS — Z96652 Presence of left artificial knee joint: Secondary | ICD-10-CM | POA: Diagnosis not present

## 2021-10-10 DIAGNOSIS — R7303 Prediabetes: Secondary | ICD-10-CM | POA: Diagnosis not present

## 2021-10-12 ENCOUNTER — Encounter: Payer: BC Managed Care – PPO | Admitting: Physical Therapy

## 2021-10-17 DIAGNOSIS — R110 Nausea: Secondary | ICD-10-CM | POA: Diagnosis not present

## 2021-10-17 DIAGNOSIS — J0100 Acute maxillary sinusitis, unspecified: Secondary | ICD-10-CM | POA: Diagnosis not present

## 2021-10-17 DIAGNOSIS — R519 Headache, unspecified: Secondary | ICD-10-CM | POA: Diagnosis not present

## 2021-10-17 DIAGNOSIS — R030 Elevated blood-pressure reading, without diagnosis of hypertension: Secondary | ICD-10-CM | POA: Diagnosis not present

## 2021-11-01 DIAGNOSIS — F419 Anxiety disorder, unspecified: Secondary | ICD-10-CM | POA: Diagnosis not present

## 2021-11-03 ENCOUNTER — Ambulatory Visit: Payer: BC Managed Care – PPO | Admitting: Adult Health

## 2021-11-07 ENCOUNTER — Ambulatory Visit (INDEPENDENT_AMBULATORY_CARE_PROVIDER_SITE_OTHER): Payer: BC Managed Care – PPO | Admitting: Orthopaedic Surgery

## 2021-11-07 ENCOUNTER — Encounter: Payer: Self-pay | Admitting: Orthopaedic Surgery

## 2021-11-07 DIAGNOSIS — Z96652 Presence of left artificial knee joint: Secondary | ICD-10-CM

## 2021-11-07 NOTE — Progress Notes (Signed)
The patient is now over 3 months status post a left total knee arthroplasty.  She had a manipulation under anesthesia after surgery and since then has done wonderful.  She still has daily pain to be expected and pain that wakes her up at night but overall her progress has been great.  On exam today she has full extension and full flexion of her left knee.  It feels stable on my exam.  There is swelling to be expected.  She will continue to work on her own with stamina and strength.  We will see her back in 3 months and at that visit we will have a standing AP and lateral of her left operative knee.  All questions and concerns were answered and addressed.

## 2021-12-29 DIAGNOSIS — H52223 Regular astigmatism, bilateral: Secondary | ICD-10-CM | POA: Diagnosis not present

## 2021-12-29 DIAGNOSIS — H43392 Other vitreous opacities, left eye: Secondary | ICD-10-CM | POA: Diagnosis not present

## 2021-12-29 DIAGNOSIS — H25012 Cortical age-related cataract, left eye: Secondary | ICD-10-CM | POA: Diagnosis not present

## 2021-12-29 DIAGNOSIS — H53022 Refractive amblyopia, left eye: Secondary | ICD-10-CM | POA: Diagnosis not present

## 2021-12-29 DIAGNOSIS — H5213 Myopia, bilateral: Secondary | ICD-10-CM | POA: Diagnosis not present

## 2021-12-29 DIAGNOSIS — H524 Presbyopia: Secondary | ICD-10-CM | POA: Diagnosis not present

## 2022-02-07 ENCOUNTER — Ambulatory Visit (INDEPENDENT_AMBULATORY_CARE_PROVIDER_SITE_OTHER): Payer: BC Managed Care – PPO | Admitting: Orthopaedic Surgery

## 2022-02-07 ENCOUNTER — Encounter: Payer: Self-pay | Admitting: Orthopaedic Surgery

## 2022-02-07 ENCOUNTER — Ambulatory Visit (INDEPENDENT_AMBULATORY_CARE_PROVIDER_SITE_OTHER): Payer: BC Managed Care – PPO

## 2022-02-07 DIAGNOSIS — Z96652 Presence of left artificial knee joint: Secondary | ICD-10-CM | POA: Diagnosis not present

## 2022-02-07 NOTE — Progress Notes (Signed)
The patient is now 6 months status post a left total knee arthroplasty.  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  She was able to get all of her motion back and really push herself hard.  She still has some swelling as the day goes on and is working on strength.  She does not need assistive device to walk.  She is a young appearing 61 year old female.  On exam her swelling is minimal.  She has excellent range of motion of her left knee which is totally full.  The knee feels ligamentously stable.  Standing AP and lateral left knee shows a well-seated total knee arthroplasty in good alignment and no complicating features.  She will continue to increase her activities as comfort allows.  I gave her reassurance that she is heading the right direction.  From my standpoint I will see her back in 6 months with a final AP and lateral of her left knee.  If there are issues before then she knows to let us know.

## 2022-02-27 DIAGNOSIS — J310 Chronic rhinitis: Secondary | ICD-10-CM | POA: Diagnosis not present

## 2022-02-27 DIAGNOSIS — J342 Deviated nasal septum: Secondary | ICD-10-CM | POA: Diagnosis not present

## 2022-02-27 DIAGNOSIS — H9042 Sensorineural hearing loss, unilateral, left ear, with unrestricted hearing on the contralateral side: Secondary | ICD-10-CM | POA: Diagnosis not present

## 2022-02-27 DIAGNOSIS — J343 Hypertrophy of nasal turbinates: Secondary | ICD-10-CM | POA: Diagnosis not present

## 2022-04-05 DIAGNOSIS — R7303 Prediabetes: Secondary | ICD-10-CM | POA: Diagnosis not present

## 2022-04-05 DIAGNOSIS — E785 Hyperlipidemia, unspecified: Secondary | ICD-10-CM | POA: Diagnosis not present

## 2022-04-05 DIAGNOSIS — E039 Hypothyroidism, unspecified: Secondary | ICD-10-CM | POA: Diagnosis not present

## 2022-04-12 DIAGNOSIS — R030 Elevated blood-pressure reading, without diagnosis of hypertension: Secondary | ICD-10-CM | POA: Diagnosis not present

## 2022-04-12 DIAGNOSIS — Z79899 Other long term (current) drug therapy: Secondary | ICD-10-CM | POA: Diagnosis not present

## 2022-04-12 DIAGNOSIS — Z713 Dietary counseling and surveillance: Secondary | ICD-10-CM | POA: Diagnosis not present

## 2022-04-12 DIAGNOSIS — R69 Illness, unspecified: Secondary | ICD-10-CM | POA: Diagnosis not present

## 2022-04-12 DIAGNOSIS — Z79890 Hormone replacement therapy (postmenopausal): Secondary | ICD-10-CM | POA: Diagnosis not present

## 2022-04-12 DIAGNOSIS — R7303 Prediabetes: Secondary | ICD-10-CM | POA: Diagnosis not present

## 2022-04-12 DIAGNOSIS — Z6834 Body mass index (BMI) 34.0-34.9, adult: Secondary | ICD-10-CM | POA: Diagnosis not present

## 2022-04-12 DIAGNOSIS — G4700 Insomnia, unspecified: Secondary | ICD-10-CM | POA: Diagnosis not present

## 2022-04-12 DIAGNOSIS — Z96652 Presence of left artificial knee joint: Secondary | ICD-10-CM | POA: Diagnosis not present

## 2022-04-12 DIAGNOSIS — E039 Hypothyroidism, unspecified: Secondary | ICD-10-CM | POA: Diagnosis not present

## 2022-04-12 DIAGNOSIS — E785 Hyperlipidemia, unspecified: Secondary | ICD-10-CM | POA: Diagnosis not present

## 2022-04-12 DIAGNOSIS — I5032 Chronic diastolic (congestive) heart failure: Secondary | ICD-10-CM | POA: Diagnosis not present

## 2022-05-08 DIAGNOSIS — Z713 Dietary counseling and surveillance: Secondary | ICD-10-CM | POA: Diagnosis not present

## 2022-05-08 DIAGNOSIS — E039 Hypothyroidism, unspecified: Secondary | ICD-10-CM | POA: Diagnosis not present

## 2022-05-08 DIAGNOSIS — Z6834 Body mass index (BMI) 34.0-34.9, adult: Secondary | ICD-10-CM | POA: Diagnosis not present

## 2022-05-08 DIAGNOSIS — R7303 Prediabetes: Secondary | ICD-10-CM | POA: Diagnosis not present

## 2022-05-08 DIAGNOSIS — E669 Obesity, unspecified: Secondary | ICD-10-CM | POA: Diagnosis not present

## 2022-05-10 ENCOUNTER — Encounter: Payer: Self-pay | Admitting: Physician Assistant

## 2022-05-10 NOTE — Progress Notes (Unsigned)
Cardiology Office Note    Date:  05/11/2022   ID:  [name], DOB 05-27-1960, MRN [id number]  PCP:  Celene Squibb, MD  Cardiologist:  Lauree Chandler, MD  Electrophysiologist:  None   Chief Complaint: f/u CAD  History of Present Illness:   Maria Becker is a 62 y.o. female with history of nonobstructive CAD, LE edema/possible chronic diastolic CHF, GERD, fibromyalgia, hyperlipidemia and HTN who is here today for follow up. She was remotely evaluated by Dr. Tamala Julian with prior echo in 2017 showing normal EF, mild MR, and abnormal stress test prompting cath with nonobstructive LAD disease. Dr. Angelena Form met her for evaluation of dyspnea, edema, and chest discomfort in 03/2021 since first Covid booster and also had gained 40lb. Repeat echo 04/2021 showed EF 60-65%, mild MR. Repeat cath 04/15/21 showed 40% mid LAD stenosis and 40% mid Circumflex stenosis. Medical therapy was recommended. Crestor and ASA started. She has history of LE edema for which she has been prescribed Lasix. Dr. Angelena Form previously called this chronic diastolic CHF. Diastolic parameters were normal on echo 04/2021 so may be component of venous insufficiency.  She is seen for follow-up overall doing well. She reports she feels much better than prior visit. She had knee surgery in 07/2021. She reports her edema has been well controlled. She has an occasional sensation of needing to cough when lying back. She clarifies that she only takes '81mg'$  ASA once nightly, Lasix PRN (infrequently PRN swelling no more than 2x/week), and potassium only PRN when taking Lasix. She had recent labs by PCP pulled up on [facility] portal with LDL 129. She reports she had not been taking rosuvastatin daily, only about every other day, due to leg pain. She is working on lifestyle changes and hopeful to lose weight. She is doing the bicycle 30 minutes a day and will be installing a pool at her home.  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 cath 07/2015: 30-50% mid-LAD stenosis but otherwise normal cors   Diastolic CHF, chronic (HCC)    Dyspnea    SOME SOB WITH EXERTION   Family history of adverse reaction to anesthesia    MOTHER SLOW TO WAKE UP   Fibromyalgia    GERD (gastroesophageal reflux disease)    Headache    History of kidney stones    HTN (hypertension) 07/20/2015   Hyperlipidemia    a. not currently on statin therapy   Hypothyroidism    Insomnia    Pneumonia    HX OF AS A BABY    Past Surgical History:  Procedure Laterality Date   ABDOMINAL HYSTERECTOMY     BACK SURGERY     CARDIAC CATHETERIZATION N/A 07/20/2015   Procedure: Left Heart Cath and Coronary Angiography;  Surgeon: Belva Crome, MD;  Location: [facility];  Service: Cardiovascular;  Laterality: N/A;   CESAREAN SECTION     L4-L5 fusion     RIGHT/LEFT HEART CATH AND CORONARY ANGIOGRAPHY N/A 04/15/2021   Procedure: RIGHT/LEFT HEART CATH AND CORONARY ANGIOGRAPHY;  Surgeon: Burnell Blanks, MD;  Location: [facility];  Service: Cardiovascular;  Laterality: N/A;   TOTAL KNEE ARTHROPLASTY Left 07/22/2021   Procedure: LEFT TOTAL KNEE ARTHROPLASTY;  Surgeon: [name], MD;  Location: [facility];  Service: Orthopedics;  Laterality: Left;  RFNA    Current Medications: Current Meds  Medication Sig   aspirin 81 MG chewable tablet Chew 1 tablet (81 mg total) by mouth 2 (two) times daily. Only takes once daily at bedtime   b complex vitamins tablet Take 1 tablet by mouth 2 (two) times a week.   furosemide (LASIX) 40 MG tablet Take 1 tablet (40 mg total) by mouth daily. Only takes PRN edema    liothyronine (CYTOMEL) 5 MCG tablet Take 10 mcg by mouth daily.   MAGNESIUM PO Take 250 mg by mouth at bedtime.   Multiple Vitamins-Minerals (PRESERVISION AREDS) CAPS Take 1 capsule by mouth daily.   NON FORMULARY    Omega-3 Fatty Acids (FISH OIL PO) Take 820 mg by mouth 2 (two) times daily.   omeprazole (PRILOSEC) 40 MG capsule Take 40 mg by mouth daily.   ondansetron (ZOFRAN-ODT) 4 MG disintegrating tablet Take 1 tablet (4 mg total) by mouth every 8 (eight) hours as needed for nausea or vomiting.   OVER THE COUNTER MEDICATION Take 1 Scoop by mouth 2 (two) times a week. buffered ascorbic acid powder with water   potassium chloride (KLOR-CON M) 10 MEQ tablet Take by mouth 2 (two) times a week. Takes only daily when taking Lasix   rosuvastatin (CRESTOR) 10 MG tablet Take 1 tablet (10 mg total) by mouth daily. (Patient taking differently: Take 10 mg by mouth at bedtime.) only taking about every other day   tamsulosin (FLOMAX) 0.4 MG CAPS capsule Take 0.4 mg by mouth daily as needed (Kidnsy stone).   vitamin B-12 (CYANOCOBALAMIN) 1000 MCG tablet Take 1,000 mcg by mouth daily.   Vitamin D, Ergocalciferol, (DRISDOL) 1.25 MG (50000 UNIT) CAPS capsule Take 50,000 Units by mouth every Monday.   VITAMIN E PO Take 50 Units by mouth 2 (two) times a week.     Allergies:   Other   Social History   Socioeconomic History   Marital status: Married    Spouse name: Not on file   Number of children: 2   Years of education: 13   Highest education level: Not on file  Occupational History   Occupation: self-employed  Tobacco Use   Smoking status: Never   Smokeless tobacco: Never  Vaping Use   Vaping Use: Never used  Substance and Sexual Activity   Alcohol use: Yes    Comment: occasional   Drug use: No   Sexual activity: Yes    Birth control/protection: Other-see comments  Other Topics Concern   Not on file  Social History Narrative   Patient drinks 1 soda daily.   Patient is right handed.     Social Determinants of Health   Financial Resource Strain: Not on file  Food Insecurity: Not on file  Transportation Needs: Not on file  Physical Activity: Not on file  Stress: Not on file  Social Connections: Not on file     Family History:  The patient's family history includes Aneurysm in her cousin, maternal uncle, and mother; COPD in her mother; Cancer - Colon in her maternal aunt; Diabetes in her brother, father, maternal aunt, maternal grandfather, maternal grandmother, mother, paternal grandfather, paternal grandmother, and sister; Heart attack (age of onset: 12) in her brother; Heart attack (age of onset: 35) in her mother; Heart disease  in her maternal grandfather, maternal grandmother, paternal grandfather, and paternal grandmother; Hypothyroidism in her paternal aunt; Kidney disease in her brother and maternal aunt; Melanoma in her father.  ROS:   Please see the history of present illness.  All other systems are reviewed and otherwise negative.    EKG(s)/Additional Testing   EKG:  EKG is ordered today, personally reviewed, demonstrating NSR 70bpm, no acute STT changes  CV Studies: Cardiac studies reviewed are outlined and summarized above. Otherwise please see EMR for full report.  Recent Labs: 07/23/2021: BUN 15; Creatinine, Ser 0.77; Hemoglobin 11.8; Platelets 190; Potassium 4.4; Sodium 140  Recent Lipid Panel    Component Value Date/Time   CHOL (H) 02/28/2007 1059    237        ATP III CLASSIFICATION:  <200     mg/dL   Desirable  200-239  mg/dL   Borderline High  >=240    mg/dL   High   TRIG 37 02/28/2007 1059   HDL 71 02/28/2007 1059   CHOLHDL 3.3 02/28/2007 1059   VLDL 7 02/28/2007 1059   LDLCALC (H) 02/28/2007 1059    159        Total Cholesterol/HDL:CHD Risk Coronary Heart Disease Risk Table                     Men   Women  1/2 Average Risk   3.4   3.3    PHYSICAL EXAM:    VS:  BP 110/80   Pulse 70   Ht '5\' 6"'$  (1.676 m)   Wt 216 lb 9.6 oz (98.2  kg)   SpO2 97%   BMI 34.96 kg/m   BMI: Body mass index is 34.96 kg/m.  GEN: Well nourished, well developed female in no acute distress HEENT: normocephalic, atraumatic Neck: no JVD, carotid bruits, or masses Cardiac: RRR; no murmurs, rubs, or gallops, mild sockline edema soft and nonpitting Respiratory:  clear to auscultation bilaterally, normal work of breathing GI: soft, nontender, nondistended, + BS MS: no deformity or atrophy Skin: warm and dry, no rash Neuro:  Alert and Oriented x 3, Strength and sensation are intact, follows commands Psych: euthymic mood, full affect  Wt Readings from Last 3 Encounters:  05/11/22 216 lb 9.6 oz (98.2 kg)  07/22/21 213 lb (96.6 kg)  07/15/21 213 lb (96.6 kg)     ASSESSMENT & PLAN:   1. CAD - no recent anginal symptoms. Will update med list to reflect she is only taking ASA once a day at night. No indication to start BB at this time. LDL not at goal. Was taking rosuvastatin less than daily. We discussed switching to atorvastatin. She prefers to increase rosuvastatin to daily with recheck fasting lipids/liver at our office in 6-8 weeks. If she fails two statins would send to lipid clinic for alternative options. We discussed that while evidence is lacking to support routine use of CoQ10 specifically for statin myalgias, anecdotally some patients have done well on 100-'200mg'$  daily.  2. Lower extremity edema - previously called diastolic CHF. Echo showed normal diastolic parameters. Cath showed normal right and left heart pressures. Wonder if this is more related to venous insufficiency. She reports good control taking Lasix PRN rather than daily - will update rx to reflect she is taking this as needed, as well as potassium rx only when taking Lasix.  3. Mild mitral regurgitation - no acute concerns today. Anticipate recheck echo 3-5 years from last study (I.e. 04/2024 and beyond), can  be arranged closer in follow-up.   4. Hypertension - normal BP today.  No acute changes made.     Disposition: F/u with Dr. Angelena Form or myself in 1 year.   Medication Adjustments/Labs and Tests Ordered: Current medicines are reviewed at length with the patient today.  Concerns regarding medicines are outlined above. Medication changes, Labs and Tests ordered today are summarized above and listed in the Patient Instructions accessible in Encounters.   Signed, Charlie Pitter, PA-C  05/11/2022 2:50 PM    [place] Phone: [phone]; Fax: [phone]

## 2022-05-11 ENCOUNTER — Ambulatory Visit: Payer: 59 | Attending: Physician Assistant | Admitting: Physician Assistant

## 2022-05-11 ENCOUNTER — Encounter: Payer: Self-pay | Admitting: Physician Assistant

## 2022-05-11 VITALS — BP 110/80 | HR 70 | Ht 66.0 in | Wt 216.6 lb

## 2022-05-11 DIAGNOSIS — I2510 Atherosclerotic heart disease of native coronary artery without angina pectoris: Secondary | ICD-10-CM

## 2022-05-11 DIAGNOSIS — R600 Localized edema: Secondary | ICD-10-CM

## 2022-05-11 DIAGNOSIS — I10 Essential (primary) hypertension: Secondary | ICD-10-CM | POA: Diagnosis not present

## 2022-05-11 DIAGNOSIS — I5032 Chronic diastolic (congestive) heart failure: Secondary | ICD-10-CM

## 2022-05-11 DIAGNOSIS — I340 Nonrheumatic mitral (valve) insufficiency: Secondary | ICD-10-CM | POA: Diagnosis not present

## 2022-05-11 MED ORDER — POTASSIUM CHLORIDE CRYS ER 10 MEQ PO TBCR: EXTENDED_RELEASE_TABLET | ORAL | 3 refills | Status: DC

## 2022-05-11 MED ORDER — FUROSEMIDE 40 MG PO TABS: ORAL_TABLET | ORAL | 3 refills | Status: AC

## 2022-05-11 MED ORDER — ASPIRIN 81 MG PO TBEC: 81.0000 mg | DELAYED_RELEASE_TABLET | Freq: Every day | ORAL | 3 refills | Status: AC

## 2022-05-11 MED ORDER — ROSUVASTATIN CALCIUM 10 MG PO TABS: ORAL_TABLET | ORAL | 11 refills | Status: DC

## 2022-05-11 NOTE — Patient Instructions (Signed)
Medication Instructions:   You may try OTC CO Q 10 (100-200 mg) daily.   *If you need a refill on your cardiac medications before your next appointment, please call your pharmacy*   Lab Work:  Your physician recommends that you return for a FASTING lipid profile/lft on Monday, April 5. You can come in on the day of your appointment anytime between 7:30-4:30 fasting from midnight the night before.   If you have labs (blood work) drawn today and your tests are completely normal, you will receive your results only by: [place] (if you have MyChart) OR A paper copy in the mail If you have any lab test that is abnormal or we need to change your treatment, we will call you to review the results.   Testing/Procedures:  None ordered.   Follow-Up: At [facility], you and your health needs are our priority.  As part of our continuing mission to provide you with exceptional heart care, we have created designated Provider Care Teams.  These Care Teams include your primary Cardiologist (physician) and Advanced Practice Providers (APPs -  Physician Assistants and Nurse Practitioners) who all work together to provide you with the care you need, when you need it.  We recommend signing up for the patient portal called "MyChart".  Sign up information is provided on this After Visit Summary.  MyChart is used to connect with patients for Virtual Visits (Telemedicine).  Patients are able to view lab/test results, encounter notes, upcoming appointments, etc.  Non-urgent messages can be sent to your provider as well.   To learn more about what you can do with MyChart, go to [email or web address].    Your next appointment:   1 year(s)  Provider:   Lauree Chandler, MD     Other Instructions  Your physician wants you to follow-up in: 1 year with Dr. Angelena Form.  You will receive a reminder letter in the mail two months in advance. If you don't receive a letter, please call our office  to schedule the follow-up appointment.

## 2022-05-14 ENCOUNTER — Other Ambulatory Visit: Payer: Self-pay | Admitting: Cardiovascular Disease

## 2022-05-18 ENCOUNTER — Encounter: Payer: Self-pay | Admitting: Radiology

## 2022-06-19 ENCOUNTER — Ambulatory Visit: Payer: 59 | Attending: Physician Assistant

## 2022-06-19 DIAGNOSIS — I340 Nonrheumatic mitral (valve) insufficiency: Secondary | ICD-10-CM

## 2022-06-19 DIAGNOSIS — I2510 Atherosclerotic heart disease of native coronary artery without angina pectoris: Secondary | ICD-10-CM

## 2022-06-19 DIAGNOSIS — I10 Essential (primary) hypertension: Secondary | ICD-10-CM

## 2022-06-19 DIAGNOSIS — R600 Localized edema: Secondary | ICD-10-CM

## 2022-06-20 ENCOUNTER — Telehealth: Payer: Self-pay | Admitting: Physician Assistant

## 2022-06-20 DIAGNOSIS — Z79899 Other long term (current) drug therapy: Secondary | ICD-10-CM

## 2022-06-20 DIAGNOSIS — E785 Hyperlipidemia, unspecified: Secondary | ICD-10-CM

## 2022-06-20 DIAGNOSIS — I25110 Atherosclerotic heart disease of native coronary artery with unstable angina pectoris: Secondary | ICD-10-CM

## 2022-06-20 DIAGNOSIS — I2510 Atherosclerotic heart disease of native coronary artery without angina pectoris: Secondary | ICD-10-CM

## 2022-06-20 LAB — LIPID PANEL
Chol/HDL Ratio: 3.2 ratio (ref 0.0–4.4)
Cholesterol, Total: 224 mg/dL — ABNORMAL HIGH (ref 100–199)
HDL: 69 mg/dL (ref >39)
LDL Chol Calc (NIH): 131 mg/dL — ABNORMAL HIGH (ref 0–99)
Triglycerides: 137 mg/dL (ref 0–149)
VLDL Cholesterol Cal: 24 mg/dL (ref 5–40)

## 2022-06-20 LAB — HEPATIC FUNCTION PANEL
ALT: 18 IU/L (ref 0–32)
AST: 19 IU/L (ref 0–40)
Albumin: 4.1 g/dL (ref 3.9–4.9)
Alkaline Phosphatase: 92 IU/L (ref 44–121)
Bilirubin Total: 0.3 mg/dL (ref 0.0–1.2)
Bilirubin, Direct: <0.1 mg/dL (ref 0.00–0.40)
Total Protein: 6.9 g/dL (ref 6.0–8.5)

## 2022-06-20 MED ORDER — ROSUVASTATIN CALCIUM 5 MG PO TABS: 5.0000 mg | ORAL_TABLET | Freq: Every day | ORAL | Status: DC

## 2022-06-20 NOTE — Telephone Encounter (Signed)
Spoke with patient and discussed recommendation from [facility], PA-C: Since she has had some muscle aches with both, would go ahead and refer to pharmD lipid clinic to help manage and discuss statin alternatives. If she wants to try taking a lower dose of rosuvastatin daily in the meantime - IE 5mg  daily - we can try that approach, with repeat fasting lipids and liver function in 8 weeks. But would still refer to pharmD to discuss next steps in case she cannot tolerate this.    Patient states she will try rosuvastatin 5mg  QD, agreeable to meeting with Pharm D in Lipid Clinic.   Referral to Lipid Clinic placed. Scheduler to call and schedule appt with Pharm D.  Medication list updated. Lipid panel and LFTs ordered, lab appt scheduled for 08/14/22.

## 2022-06-20 NOTE — Telephone Encounter (Signed)
-----   Message from Laurann [place], [place] sent at 06/20/2022  1:10 PM EDT ----- Since she has had some muscle aches with both, would go ahead and refer to pharmD lipid clinic to help manage and discuss statin alternatives. If she wants to try taking a lower dose of rosuvastatin daily in the meantime - IE 5mg  daily - we can try that approach, with repeat fasting lipids and liver function in 8 weeks. But would still refer to pharmD to discuss next steps in case she cannot tolerate this.

## 2022-07-03 ENCOUNTER — Ambulatory Visit: Payer: 59 | Admitting: Podiatry

## 2022-07-03 ENCOUNTER — Encounter: Payer: Self-pay | Admitting: Podiatry

## 2022-07-03 ENCOUNTER — Ambulatory Visit (INDEPENDENT_AMBULATORY_CARE_PROVIDER_SITE_OTHER): Payer: 59

## 2022-07-03 DIAGNOSIS — M722 Plantar fascial fibromatosis: Secondary | ICD-10-CM | POA: Diagnosis not present

## 2022-07-03 DIAGNOSIS — M79671 Pain in right foot: Secondary | ICD-10-CM | POA: Diagnosis not present

## 2022-07-03 DIAGNOSIS — M79672 Pain in left foot: Secondary | ICD-10-CM

## 2022-07-03 MED ORDER — TRIAMCINOLONE ACETONIDE 10 MG/ML IJ SUSP
20.0000 mg | Freq: Once | INTRAMUSCULAR | Status: AC
  Administered 2022-07-03: 20 mg

## 2022-07-03 MED ORDER — DICLOFENAC SODIUM 75 MG PO TBEC: 75.0000 mg | DELAYED_RELEASE_TABLET | Freq: Two times a day (BID) | ORAL | 2 refills | Status: DC

## 2022-07-03 NOTE — Patient Instructions (Signed)

## 2022-07-05 NOTE — Progress Notes (Signed)
Subjective:   Patient ID: Maria Becker, female   DOB: 62 y.o.   MRN: [id number]   HPI Patient presents stating she has a lot of pain in both of her heels also into her arch and that is been relatively recent that this occurred.  Patient states she has had on and off problems for a long time but this has been very sore.  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       Objective:  Physical Exam Vitals and nursing note reviewed.  Constitutional:      Appearance: She is well-developed.  Pulmonary:     Effort: Pulmonary effort is normal.  Musculoskeletal:        General: Normal range of motion.  Skin:    General: Skin is warm.  Neurological:     Mental Status: She is alert.     Neurovascular status intact muscle strength found to be adequate range of motion within normal limits.  Patient is noted to have exquisite discomfort in the plantar heel of both feet that is very sore and is making it hard to walk and has moderate diminishment of arch height bilateral.  Also has pain into the arch and forefoot not as bad     Assessment:  Acute plantar fasciitis bilateral inflammation fluid around the medial band and also arch pain sore     Plan:  H&P reviewed and went ahead today did sterile prep and injected the fascia bilateral 3 mg Kenalog 5 mg Xylocaine applied fascial brace bilateral to lift up the arch placed patient on Sterapred DS 12-day Dosepak to reduce inflammation and reappoint to recheck again in 2 weeks  X-rays indicate moderate depression of the arch minimal spur formation no indication stress fracture

## 2022-07-11 ENCOUNTER — Other Ambulatory Visit: Payer: Self-pay | Admitting: Podiatry

## 2022-07-11 DIAGNOSIS — M722 Plantar fascial fibromatosis: Secondary | ICD-10-CM

## 2022-07-11 DIAGNOSIS — M79671 Pain in right foot: Secondary | ICD-10-CM

## 2022-07-17 ENCOUNTER — Encounter: Payer: Self-pay | Admitting: Podiatry

## 2022-07-17 ENCOUNTER — Ambulatory Visit: Payer: 59 | Admitting: Podiatry

## 2022-07-17 DIAGNOSIS — M722 Plantar fascial fibromatosis: Secondary | ICD-10-CM | POA: Diagnosis not present

## 2022-07-17 MED ORDER — PREDNISONE 10 MG PO TABS
ORAL_TABLET | ORAL | 0 refills | Status: DC
Start: 2022-07-17 — End: 2023-05-17

## 2022-07-17 MED ORDER — TRIAMCINOLONE ACETONIDE 10 MG/ML IJ SUSP
20.0000 mg | Freq: Once | INTRAMUSCULAR | Status: AC
  Administered 2022-07-17: 20 mg

## 2022-07-17 NOTE — Progress Notes (Signed)
Subjective:   Patient ID: Maria Becker, female   DOB: 62 y.o.   MRN: [id number]   HPI Patient states she is improved but still having pain in the plantar of both heels   ROS      Objective:  Physical Exam  Neurovascular status intact with exquisite discomfort medial fascial band bilateral with fluid buildup around the medial band     Assessment:  Acute fasciitis bilateral heels at insertion     Plan:  Reviewed condition there is been some improvement but still discomfort in the heel so I went ahead today did sterile prep reinjected the fascia bilateral 3 mg Kenalog 5 mg Xylocaine applied sterile dressings as symptoms indicate

## 2022-07-26 ENCOUNTER — Ambulatory Visit: Payer: 59 | Attending: Cardiovascular Disease | Admitting: Pharmacist

## 2022-07-26 ENCOUNTER — Telehealth: Payer: Self-pay | Admitting: Pharmacist

## 2022-07-26 DIAGNOSIS — E785 Hyperlipidemia, unspecified: Secondary | ICD-10-CM | POA: Diagnosis not present

## 2022-07-26 DIAGNOSIS — I2510 Atherosclerotic heart disease of native coronary artery without angina pectoris: Secondary | ICD-10-CM

## 2022-07-26 NOTE — Progress Notes (Signed)
Patient ID: Maria Becker                 DOB: 1960/05/18                    MRN: [id number]     HPI: Maria Becker is a 62 y.o. female patient referred to lipid clinic by Ronie Spies, PA. PMH is significant for nonobstructive CAD, LE edema/possible chronic diastolic CHF, fibromyalgia, HLD, HTN, and GERD. Cath 04/15/21 showed 40% mid LAD stenosis and 40% mid circumflex stenosis. She was started on Crestor and aspirin. Last saw Dayna on 05/11/22. At that time, reported taking her Crestor 10mg  every other day due to leg pain - recent LDL at her PCP office was 433. She increased her statin to daily dosing at that time. Follow up lipid panel in April showed LDL at 131. Her rosuvastatin was decreased to 5mg  daily due to ongoing leg pain on higher dose and prior leg pain on atorvastatin, and she was referred to PharmD for further management.  Pt reports severe leg pain on atorvastatin 10mg  daily. Then tried rosuvastatin 10mg  ~every other day. Doesn't like taking daily prescription meds, prefers natural route when possible. Experienced muscle pain in her legs on this. Dose decreased to 5mg , has been taking this every other day to every 3 days, still with some pain. CoQ10 didn't help. Not sure if due to her statin or due to knee/foot pain. Has been on and off of prednisone, has been dealing with increase in blood sugar and weight gain from this as well. Very strong family history of CAD and DM. Did take the rosuvastatin daily for 6 weeks leading up to most recent labs where her LDL was elevated at 131.  Current Medications: rosuvastatin 5mg  every 2-3 days, fish oil 820mg  Intolerances: rosuvastatin 10mg  every other day, atorvastatin 10mg  daily - leg pain, restless leg Risk Factors: CAD, FHx of CAD LDL goal: 70mg /dL  Diet: Drinks water, unsweet tea, black coffee, no added sugar.  Exercise: bicycle 30 minutes a day.  Family History: Aneurysm in her cousin, maternal uncle, and mother; COPD in her mother;  Cancer - Colon in her maternal aunt; Diabetes in her brother, father, maternal aunt, maternal grandfather, maternal grandmother, mother, paternal grandfather, paternal grandmother, and sister; Heart attack (age of onset: 68) in her brother; Heart attack (age of onset: 48) in her mother; Heart disease in her maternal grandfather, maternal grandmother, paternal grandfather, and paternal grandmother; Hypothyroidism in her paternal aunt; Kidney disease in her brother and maternal aunt; Melanoma in her father.   Social History: Occasional alcohol use, no drug or tobacco use.   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 cath 07/2015: 30-50% mid-LAD stenosis but otherwise normal cors   Diastolic CHF, chronic (HCC)    Dyspnea    SOME SOB WITH EXERTION   Family history of adverse reaction to anesthesia    MOTHER SLOW TO WAKE UP   Fibromyalgia    GERD (gastroesophageal reflux disease)    Headache    History of kidney stones    HTN (hypertension) 07/20/2015   Hyperlipidemia    a. not currently on statin therapy   Hypothyroidism    Insomnia    Pneumonia    HX OF AS A BABY  Current Outpatient Medications on File Prior to Visit  Medication Sig Dispense Refill   aspirin EC 81 MG tablet Take 1 tablet (81 mg total) by mouth daily. Swallow whole. 90 tablet 3   b complex vitamins tablet Take 1 tablet by mouth 2 (two) times a week.     diclofenac (VOLTAREN) 75 MG EC tablet Take 1 tablet (75 mg total) by mouth 2 (two) times daily. 50 tablet 2   furosemide (LASIX) 40 MG tablet Take one (1) tablet by mouth ( 40 mg) as needed for swelling. 30 tablet 3   liothyronine (CYTOMEL) 5 MCG tablet Take 10 mcg by mouth daily.     MAGNESIUM PO Take 250 mg by mouth at bedtime.     Multiple Vitamins-Minerals (PRESERVISION AREDS) CAPS Take 1 capsule by mouth  daily.     NON FORMULARY      Omega-3 Fatty Acids (FISH OIL PO) Take 820 mg by mouth 2 (two) times daily.     omeprazole (PRILOSEC) 40 MG capsule Take 40 mg by mouth daily.     ondansetron (ZOFRAN-ODT) 4 MG disintegrating tablet Take 1 tablet (4 mg total) by mouth every 8 (eight) hours as needed for nausea or vomiting. 20 tablet 0   OVER THE COUNTER MEDICATION Take 1 Scoop by mouth 2 (two) times a week. buffered ascorbic acid powder with water     potassium chloride (KLOR-CON M) 10 MEQ tablet Take one (1) tablet by mouth as needed for swelling with Lasix. 30 tablet 3   predniSONE (DELTASONE) 10 MG tablet 12 day tapering dose 48 tablet 0   rosuvastatin (CRESTOR) 5 MG tablet Take 1 tablet (5 mg total) by mouth daily.     tamsulosin (FLOMAX) 0.4 MG CAPS capsule Take 0.4 mg by mouth daily as needed (Kidnsy stone).     vitamin B-12 (CYANOCOBALAMIN) 1000 MCG tablet Take 1,000 mcg by mouth daily.     Vitamin D, Ergocalciferol, (DRISDOL) 1.25 MG (50000 UNIT) CAPS capsule Take 50,000 Units by mouth every Monday.     VITAMIN E PO Take 50 Units by mouth 2 (two) times a week.     Current Facility-Administered Medications on File Prior to Visit  Medication Dose Route Frequency Provider Last Rate Last Admin   gadopentetate dimeglumine (MAGNEVIST) injection 20 mL  20 mL Intravenous Once PRN York Spaniel, MD        Allergies  Allergen Reactions   Other Other (See Comments)    Dairy cow milk/ constipation    Assessment/Plan:  1. Hyperlipidemia - LDL 131 above goal < 70 due to CAD. Pt intolerant to low dose rosuvastatin and atorvastatin. Discussed trial of either Nexlizet or Repatha. Will check Lp(a) today given her strong family history of CAD. If Lp(a) is elevated, will have stronger preference for Repatha.  2. Weight loss - Pt struggling with weight loss especially with ongoing prednisone use. Will submit prior auth request for Zepbound to see if her insurance covers weight loss meds per pt  request. Her PCP tried to put her on Ozempic but she doesn't have DM so coverage was denied. Most recent A1c was prediabetic at 5.8% in January.  Cas Tracz E. Joss Friedel, PharmD, BCACP, CPP [facility] [address] [place], [place] [address] Phone: [phone]; Fax: [phone] 07/26/2022 3:57 PM

## 2022-07-26 NOTE — Patient Instructions (Addendum)
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 Both medications help to lower your risk of having a heart attack or stroke  I'll submit a request for either Zepbound or Wegovy - weekly injections that help with weight loss, cardiac benefit, and glucose lowering

## 2022-07-26 NOTE — Telephone Encounter (Signed)
Zepbound PA submitted, Key: [id number], waiting for clinical questions to populate.

## 2022-07-27 NOTE — Telephone Encounter (Signed)
PA submitted.

## 2022-07-28 LAB — LIPOPROTEIN A (LPA): Lipoprotein (a): 100 nmol/L — ABNORMAL HIGH (ref <75.0)

## 2022-07-31 ENCOUNTER — Other Ambulatory Visit (HOSPITAL_COMMUNITY): Payer: Self-pay

## 2022-07-31 ENCOUNTER — Telehealth: Payer: Self-pay

## 2022-07-31 NOTE — Telephone Encounter (Addendum)
Pharmacy Patient Advocate Encounter   Received notification from [facility] that prior authorization for REPATHA 140MG /ML is required/requested.   PA submitted on 5.20.24 to (ins) [employer] via CoverMyMeds  Key or (Medicaid) confirmation # [id number]  Status is pending

## 2022-07-31 NOTE — Telephone Encounter (Signed)
Pharmacy Patient Advocate Encounter   Received notification from [facility] that prior authorization for [facility] is required/requested.   PA submitted on 5.20.24 to (ins) [employer] via CoverMyMeds  Key or [facility]) confirmation # [id number]  Status is pending

## 2022-07-31 NOTE — Telephone Encounter (Signed)
Spoke with patient. Pain from statins occurs in both hips and calves. Onset is within 4 weeks of therapy and improves within 2 weeks of stopping. Same thing occurred with different statin. Re challenge pain occurred right away. This gives Maria Becker a SAMS score of 11

## 2022-07-31 NOTE — Telephone Encounter (Signed)
Zepbound denied- plan exclusion.  Called pt and made her aware. I do not think we will win an appeal. Cash price $550 w/ coupon. Encouraged her to add strength training and be patient. Healthy weight loss is 1lb per week. Can try PA for wegovy under CAD, but I'm not confident it would be approved  LP(a) =100 Not technically a risk enhancer, grey zone. But I think Repatha would be the best option for her. Will submit PA for Repatha.

## 2022-08-03 NOTE — Telephone Encounter (Signed)
Pharmacy Patient Advocate Encounter  Received notification from [facility] that the request for prior authorization for REPATHA has been denied due to CRITERIA FOR ASCVD NOT MET PER PLANPLEASE ADVISE  Haze Rushing, CPhT Pharmacy Patient Advocate Specialist Direct Number: [phone] Fax: [phone]

## 2022-08-03 NOTE — Telephone Encounter (Signed)
Pt does have ASCVD - she has CAD. Insurance defines ASCVD differently and only includes CAD s/p revascularization which is incorrect/ CAD without revascularization is still CAD and therefore ASCVD which Repatha is indicated for.   Cath 04/2021 showed:    Mid Cx lesion is 40% stenosed.   Mid LAD lesion is 40% stenosed.   Normal right and left heart pressures Mild non-obstructive disease in the mid LAD Mild non-obstructive disease in the small caliber mid Circumflex   Recommendations: Medical management of CAD - medical management of CAD - this absolutely includes aggressive lipid lowering. She is intolerant to multiple statins and has a very strong family history of premature CAD. Repatha is appropriate for her to bring her LDL cholesterol to goal and prevent further CAD.

## 2022-08-03 NOTE — Telephone Encounter (Signed)
Pharmacy Patient Advocate Encounter  Received notification from [facility] that the request for prior authorization for [facility] has been denied due to PLAN BENEFIT EXCLUSION.     PLEASE ADVISE  Haze Rushing, CPhT Pharmacy Patient Advocate Specialist Direct Number: [phone] Fax: [phone]

## 2022-08-04 NOTE — Telephone Encounter (Signed)
Pt aware insurance does not cover Wegovy and that Repatha was also denied, currently being appealed.

## 2022-08-04 NOTE — Telephone Encounter (Signed)
Called pt to advise her that [facility] PA was also denied and so was her Repatha, and that Repatha is being appealed. She was appreciative for the update.

## 2022-08-07 ENCOUNTER — Ambulatory Visit: Payer: BC Managed Care – PPO | Admitting: Orthopaedic Surgery

## 2022-08-08 ENCOUNTER — Encounter: Payer: Self-pay | Admitting: Orthopaedic Surgery

## 2022-08-08 ENCOUNTER — Ambulatory Visit: Payer: 59 | Admitting: Orthopaedic Surgery

## 2022-08-08 ENCOUNTER — Other Ambulatory Visit (INDEPENDENT_AMBULATORY_CARE_PROVIDER_SITE_OTHER): Payer: 59

## 2022-08-08 DIAGNOSIS — Z96652 Presence of left artificial knee joint: Secondary | ICD-10-CM

## 2022-08-08 NOTE — Progress Notes (Signed)
HPI: [name] returns today for follow-up left total knee arthroplasty performed on 07/22/2021.  She states she is about 90% better but still has some pain whenever she is working in her garden and some giving way whenever she is cooking in the kitchen.  She has had no known injury.  She is use Voltaren gel on the knees.  She is mostly swimming for exercise.  She developed plantar fasciitis while using an exercise bike and so she stopped using exercise bike.  Review of systems: See HPI otherwise negative or noncontributory  Physical exam: General well-developed well-nourished female in no acute distress uses no ambulatory device.  Bilateral knees: Hyperextension both knees.  Full flexion both knees.  No instability valgus varus stressing left knee.  Radiographs: Left knee AP view and lateral views: Shows well-seated left total knee arthroplasty without any complicating features.  Knee is well located.  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  Exercises are discussed.  Also discussed with her plantar fasciitis.  She should avoid open back shoes.  Stretching exercises shown.  Follow-up as needed.  Questions were encouraged and answered by Dr. Magnus Ivan and myself.

## 2022-08-14 ENCOUNTER — Ambulatory Visit: Payer: 59 | Attending: Physician Assistant

## 2022-08-14 DIAGNOSIS — E785 Hyperlipidemia, unspecified: Secondary | ICD-10-CM

## 2022-08-14 DIAGNOSIS — I25110 Atherosclerotic heart disease of native coronary artery with unstable angina pectoris: Secondary | ICD-10-CM

## 2022-08-14 DIAGNOSIS — I2510 Atherosclerotic heart disease of native coronary artery without angina pectoris: Secondary | ICD-10-CM

## 2022-08-14 DIAGNOSIS — Z79899 Other long term (current) drug therapy: Secondary | ICD-10-CM

## 2022-08-14 LAB — HEPATIC FUNCTION PANEL
ALT: 15 IU/L (ref 0–32)
AST: 19 IU/L (ref 0–40)
Albumin: 4.4 g/dL (ref 3.9–4.9)
Alkaline Phosphatase: 87 IU/L (ref 44–121)
Bilirubin Total: 0.3 mg/dL (ref 0.0–1.2)
Bilirubin, Direct: 0.1 mg/dL (ref 0.00–0.40)
Total Protein: 7 g/dL (ref 6.0–8.5)

## 2022-08-14 LAB — LIPID PANEL
Chol/HDL Ratio: 3 ratio (ref 0.0–4.4)
Cholesterol, Total: 231 mg/dL — ABNORMAL HIGH (ref 100–199)
HDL: 78 mg/dL (ref >39)
LDL Chol Calc (NIH): 136 mg/dL — ABNORMAL HIGH (ref 0–99)
Triglycerides: 99 mg/dL (ref 0–149)
VLDL Cholesterol Cal: 17 mg/dL (ref 5–40)

## 2022-08-16 NOTE — Telephone Encounter (Signed)
An appeal would need to be completed with the addt documentation for a possible approval

## 2022-08-23 ENCOUNTER — Other Ambulatory Visit (HOSPITAL_COMMUNITY): Payer: Self-pay

## 2022-08-25 ENCOUNTER — Other Ambulatory Visit (HOSPITAL_COMMUNITY): Payer: Self-pay

## 2022-08-25 ENCOUNTER — Telehealth: Payer: Self-pay | Admitting: Pharmacist

## 2022-08-25 DIAGNOSIS — E785 Hyperlipidemia, unspecified: Secondary | ICD-10-CM

## 2022-08-25 NOTE — Telephone Encounter (Signed)
Called insurance at [id number] for status update on Repatha appeals sent on 6/5. They forwarded me to appeals line [phone] option 5. Confirmed they received appeal on 6/5, decision will be made by 7/5. Left message for pt of Repatha delay secondary to slow insurance.

## 2022-08-28 ENCOUNTER — Other Ambulatory Visit (HOSPITAL_COMMUNITY): Payer: Self-pay

## 2022-08-30 ENCOUNTER — Encounter: Payer: Self-pay | Admitting: Pharmacist

## 2022-08-30 ENCOUNTER — Other Ambulatory Visit (HOSPITAL_COMMUNITY): Payer: Self-pay

## 2022-08-30 MED ORDER — REPATHA SURECLICK 140 MG/ML ~~LOC~~ SOAJ: 140.0000 mg | SUBCUTANEOUS | 11 refills | Status: DC

## 2022-08-30 NOTE — Telephone Encounter (Signed)
Called pt to notify her of Repatha approval. Rx sent to pharmacy, follow up labs scheduled. Activated $5 copay card and sent info to pt in mychart message. She was appreciative for the assistance.

## 2022-08-30 NOTE — Telephone Encounter (Signed)
I have been test billing daily to check for approval. It's approved now! Max 30 day supply.

## 2022-08-30 NOTE — Addendum Note (Signed)
Addended by: Dalaina Tates E on: 08/30/2022 02:22 PM   Modules accepted: Orders

## 2022-09-28 ENCOUNTER — Other Ambulatory Visit (HOSPITAL_COMMUNITY): Payer: Self-pay | Admitting: Family Medicine

## 2022-09-28 ENCOUNTER — Ambulatory Visit (HOSPITAL_COMMUNITY)
Admission: RE | Admit: 2022-09-28 | Discharge: 2022-09-28 | Disposition: A | Discharge status: Home / Self Care | Payer: 59 | Source: Ambulatory Visit | Attending: Family Medicine | Admitting: Family Medicine

## 2022-09-28 DIAGNOSIS — N200 Calculus of kidney: Secondary | ICD-10-CM | POA: Diagnosis not present

## 2022-09-28 DIAGNOSIS — Z713 Dietary counseling and surveillance: Secondary | ICD-10-CM | POA: Diagnosis not present

## 2022-09-28 DIAGNOSIS — M545 Low back pain, unspecified: Secondary | ICD-10-CM | POA: Diagnosis not present

## 2022-09-28 DIAGNOSIS — Z87442 Personal history of urinary calculi: Secondary | ICD-10-CM | POA: Diagnosis not present

## 2022-09-28 DIAGNOSIS — Z6833 Body mass index (BMI) 33.0-33.9, adult: Secondary | ICD-10-CM | POA: Diagnosis not present

## 2022-10-06 DIAGNOSIS — E039 Hypothyroidism, unspecified: Secondary | ICD-10-CM | POA: Diagnosis not present

## 2022-10-06 DIAGNOSIS — E785 Hyperlipidemia, unspecified: Secondary | ICD-10-CM | POA: Diagnosis not present

## 2022-10-06 DIAGNOSIS — R7303 Prediabetes: Secondary | ICD-10-CM | POA: Diagnosis not present

## 2022-10-12 DIAGNOSIS — N200 Calculus of kidney: Secondary | ICD-10-CM | POA: Diagnosis not present

## 2022-10-12 DIAGNOSIS — Z0001 Encounter for general adult medical examination with abnormal findings: Secondary | ICD-10-CM | POA: Diagnosis not present

## 2022-10-12 DIAGNOSIS — R7303 Prediabetes: Secondary | ICD-10-CM | POA: Diagnosis not present

## 2022-10-12 DIAGNOSIS — F411 Generalized anxiety disorder: Secondary | ICD-10-CM | POA: Diagnosis not present

## 2022-10-12 DIAGNOSIS — E785 Hyperlipidemia, unspecified: Secondary | ICD-10-CM | POA: Diagnosis not present

## 2022-10-12 DIAGNOSIS — R030 Elevated blood-pressure reading, without diagnosis of hypertension: Secondary | ICD-10-CM | POA: Diagnosis not present

## 2022-10-12 DIAGNOSIS — M722 Plantar fascial fibromatosis: Secondary | ICD-10-CM | POA: Diagnosis not present

## 2022-10-12 DIAGNOSIS — Z96652 Presence of left artificial knee joint: Secondary | ICD-10-CM | POA: Diagnosis not present

## 2022-10-12 DIAGNOSIS — Z0000 Encounter for general adult medical examination without abnormal findings: Secondary | ICD-10-CM | POA: Diagnosis not present

## 2022-10-12 DIAGNOSIS — G4700 Insomnia, unspecified: Secondary | ICD-10-CM | POA: Diagnosis not present

## 2022-10-12 DIAGNOSIS — I5032 Chronic diastolic (congestive) heart failure: Secondary | ICD-10-CM | POA: Diagnosis not present

## 2022-10-12 DIAGNOSIS — E039 Hypothyroidism, unspecified: Secondary | ICD-10-CM | POA: Diagnosis not present

## 2022-11-02 DIAGNOSIS — Z1231 Encounter for screening mammogram for malignant neoplasm of breast: Secondary | ICD-10-CM | POA: Diagnosis not present

## 2022-11-02 DIAGNOSIS — M8588 Other specified disorders of bone density and structure, other site: Secondary | ICD-10-CM | POA: Diagnosis not present

## 2022-11-06 ENCOUNTER — Ambulatory Visit: Payer: 59 | Attending: Internal Medicine

## 2022-11-07 ENCOUNTER — Encounter (INDEPENDENT_AMBULATORY_CARE_PROVIDER_SITE_OTHER): Payer: Self-pay | Admitting: *Deleted

## 2022-11-07 ENCOUNTER — Telehealth: Payer: Self-pay | Admitting: Pharmacist

## 2022-11-07 NOTE — Telephone Encounter (Signed)
Pt missed f/u labs after starting Repatha, called her to reschedule. She states her PCP checked her cholesterol on 10/06/22: 172, TG 139, HDL 62, LDL 86 (can view in [facility]). Hasn't started Repatha but is taking Crestor 10mg  3x a week consistently, tolerating ok. Also swimming every day. Discussed increasing Crestor frequency vs starting Repatha as her LDL is notable improved from prior labs (~130 range), however is still mildly elevated above her goal < 70. She prefers to increase Crestor to 4x a week. Her PCP rechecks lipids in December. Reminded her of LDL < 70 goal. She states if LDL is elevated at that time she'll start on Repatha. She already picked it up and it's been in her fridge.

## 2022-11-10 DIAGNOSIS — M722 Plantar fascial fibromatosis: Secondary | ICD-10-CM | POA: Diagnosis not present

## 2022-11-20 ENCOUNTER — Ambulatory Visit: Payer: 59 | Admitting: Podiatry

## 2022-11-20 ENCOUNTER — Encounter: Payer: Self-pay | Admitting: Podiatry

## 2022-11-20 DIAGNOSIS — M722 Plantar fascial fibromatosis: Secondary | ICD-10-CM | POA: Diagnosis not present

## 2022-11-20 MED ORDER — TRIAMCINOLONE ACETONIDE 10 MG/ML IJ SUSP
10.0000 mg | Freq: Once | INTRAMUSCULAR | Status: AC
Start: 2022-11-20 — End: 2022-11-20
  Administered 2022-11-20: 10 mg via INTRA_ARTICULAR

## 2022-11-20 NOTE — Progress Notes (Signed)
Subjective:   Patient ID: Maria Becker, female   DOB: 62 y.o.   MRN: [id number]   HPI Patient presents stating that she still has severe pain in the right plantar heel at the insertional point tendon calcaneus inflammation fluid around the medial band   ROS      Objective:  Physical Exam  Acute pain with pressure still noted right plantar fascia improvement left and it has been 4 months since she has been seen and admits it has been hurting ever since that     Assessment:  Plantar fasciitis right not responding good response left     Plan:  H&P reviewed my concerns about condition at this point went ahead did sterile prep and injected the right plantar fascia again 3 mg Kenalog 5 mg Xylocaine and applied air fracture walker to completely immobilize with the possibility for surgery if symptoms do not improve

## 2022-12-11 ENCOUNTER — Encounter: Payer: Self-pay | Admitting: Podiatry

## 2022-12-11 ENCOUNTER — Ambulatory Visit (INDEPENDENT_AMBULATORY_CARE_PROVIDER_SITE_OTHER): Payer: 59 | Admitting: Podiatry

## 2022-12-11 VITALS — BP 123/86 | HR 83

## 2022-12-11 DIAGNOSIS — M722 Plantar fascial fibromatosis: Secondary | ICD-10-CM | POA: Diagnosis not present

## 2022-12-11 NOTE — Progress Notes (Signed)
Subjective:   Patient ID: Maria Becker, female   DOB: 62 y.o.   MRN: [id number]   HPI Patient presents stating I am doing some better now still having pain but I am not exercise   ROS      Objective:  Physical Exam  Neurovascular status intact discomfort in the plantar right heel present but improved from where it was previously with inflammation still noted     Assessment:  Acute plantar fasciitis right that so far has improved but not better with immobilization being of good value to her     Plan:  H&P reviewed and I have recommended night splint and she will get that and I have recommended aggressive ice therapy anti-inflammatories and the possibility for surgery if symptoms do not get better.  Patient will be seen back and all questions were answered today

## 2023-02-13 DIAGNOSIS — H1045 Other chronic allergic conjunctivitis: Secondary | ICD-10-CM | POA: Diagnosis not present

## 2023-02-13 DIAGNOSIS — H04123 Dry eye syndrome of bilateral lacrimal glands: Secondary | ICD-10-CM | POA: Diagnosis not present

## 2023-03-28 ENCOUNTER — Other Ambulatory Visit (HOSPITAL_COMMUNITY): Payer: Self-pay | Admitting: Family Medicine

## 2023-03-28 ENCOUNTER — Encounter (HOSPITAL_COMMUNITY): Payer: Self-pay | Admitting: Family Medicine

## 2023-03-28 DIAGNOSIS — R109 Unspecified abdominal pain: Secondary | ICD-10-CM

## 2023-03-30 ENCOUNTER — Encounter (HOSPITAL_COMMUNITY): Payer: Self-pay

## 2023-03-30 ENCOUNTER — Ambulatory Visit (HOSPITAL_COMMUNITY): Payer: No Typology Code available for payment source

## 2023-05-01 IMAGING — MG MM DIGITAL SCREENING BILAT W/ TOMO AND CAD
8 series · 8 of 24 positions shown · non-contrast
Comparison: Previous exam(s).

CLINICAL DATA: Screening.

EXAM:
DIGITAL SCREENING BILATERAL MAMMOGRAM WITH TOMOSYNTHESIS AND CAD
TECHNIQUE: Bilateral screening digital craniocaudal and mediolateral oblique
mammograms were obtained. Bilateral screening digital breast
tomosynthesis was performed. The images were evaluated with
computer-aided detection.

[R MLO synth-2D]
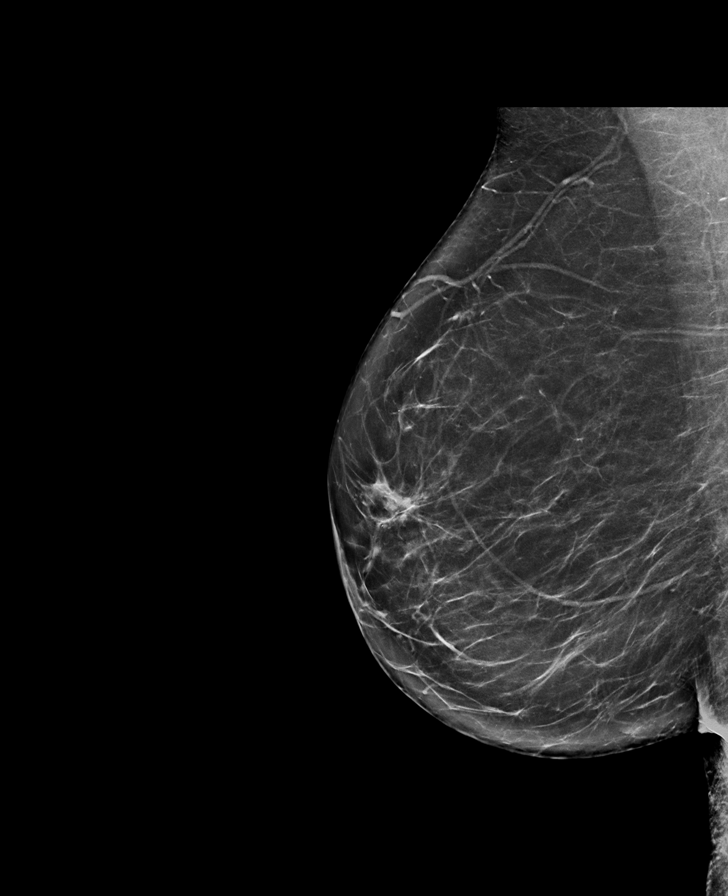

[R CC synth-2D]
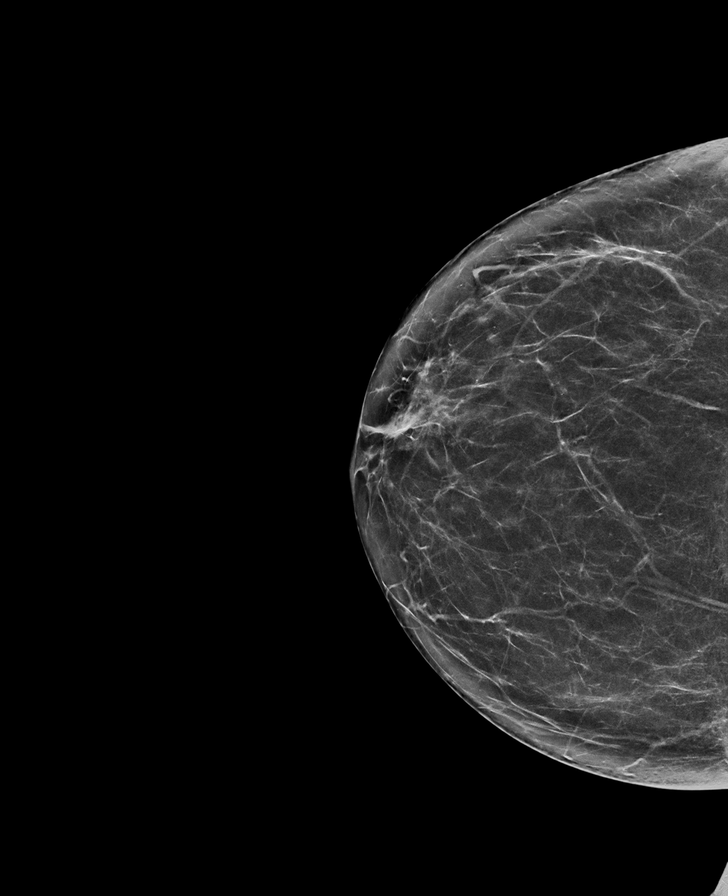

[L CC synth-2D]
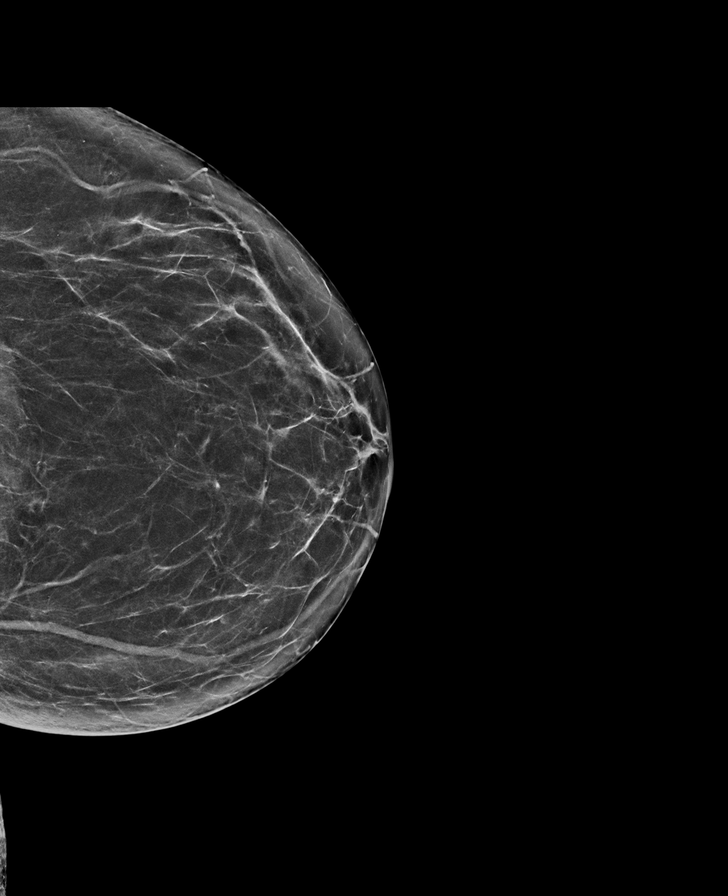

[L MLO synth-2D]
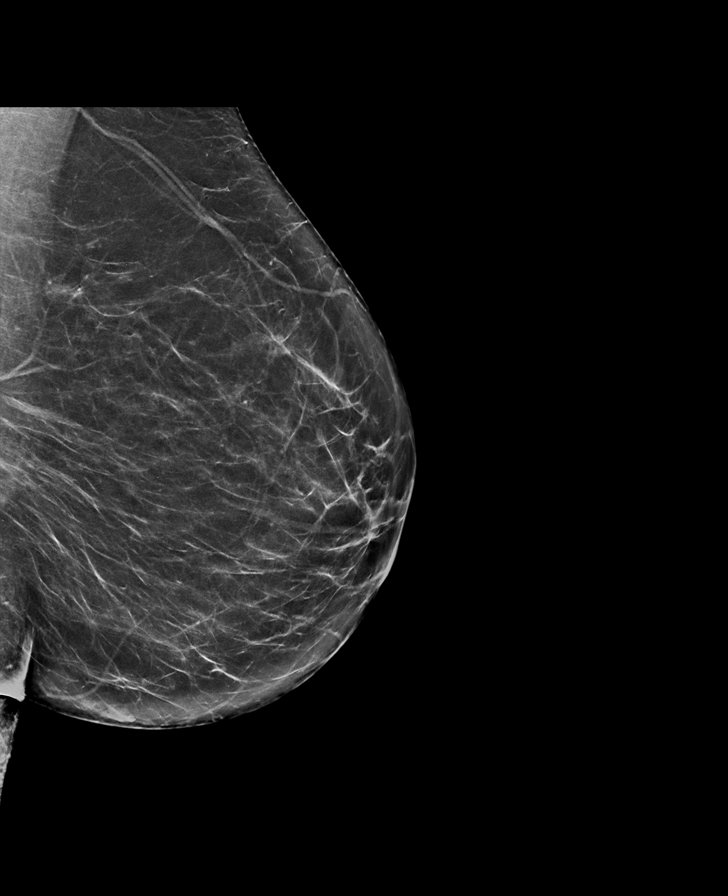

[R MLO tomo · tomo slice 37/74.0]
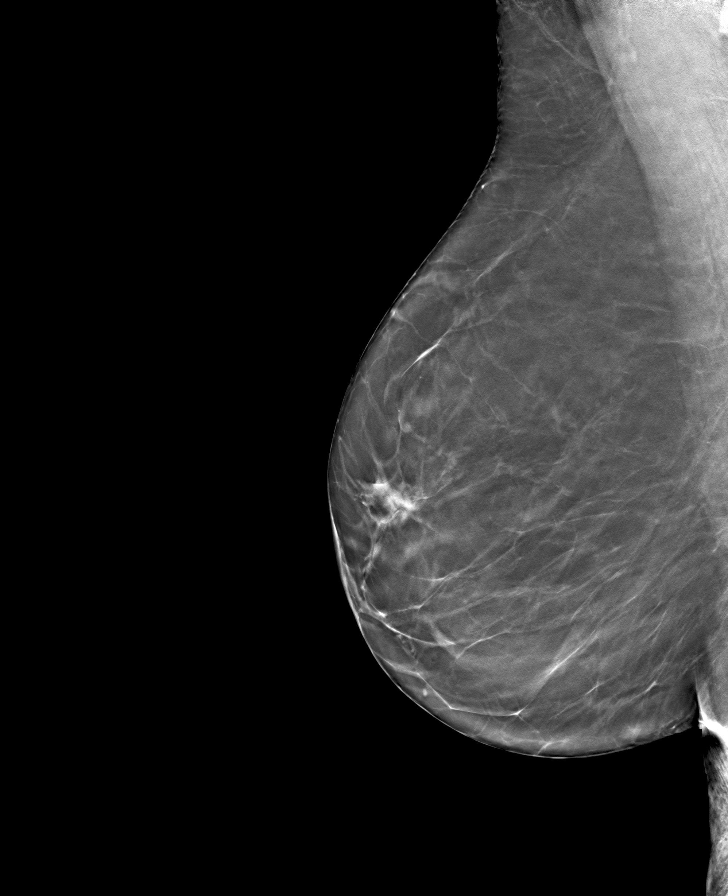

[R CC tomo · tomo slice 34/67.0]
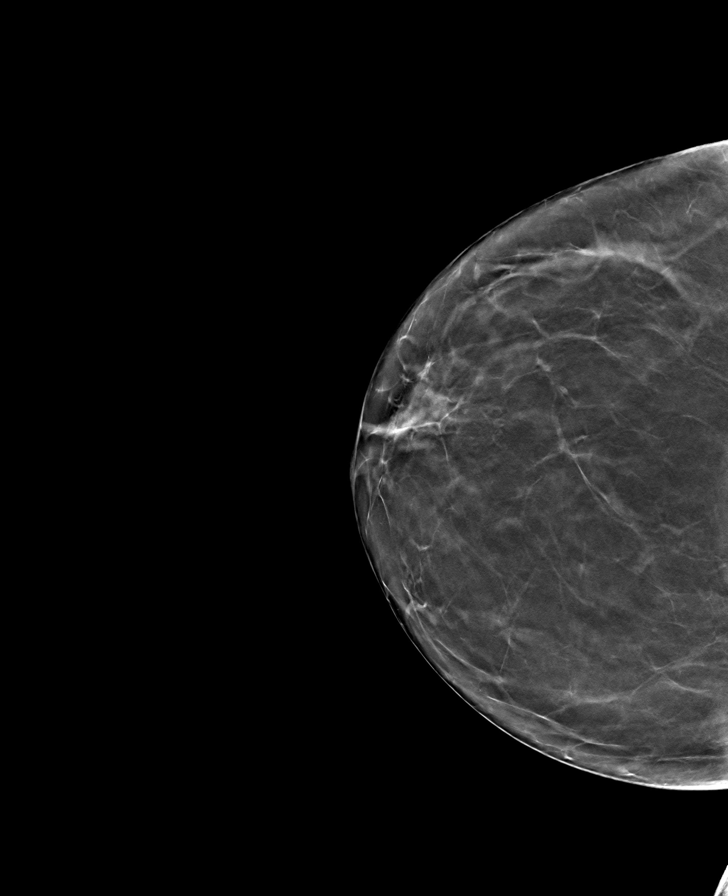

[L CC tomo · tomo slice 35/68.0]
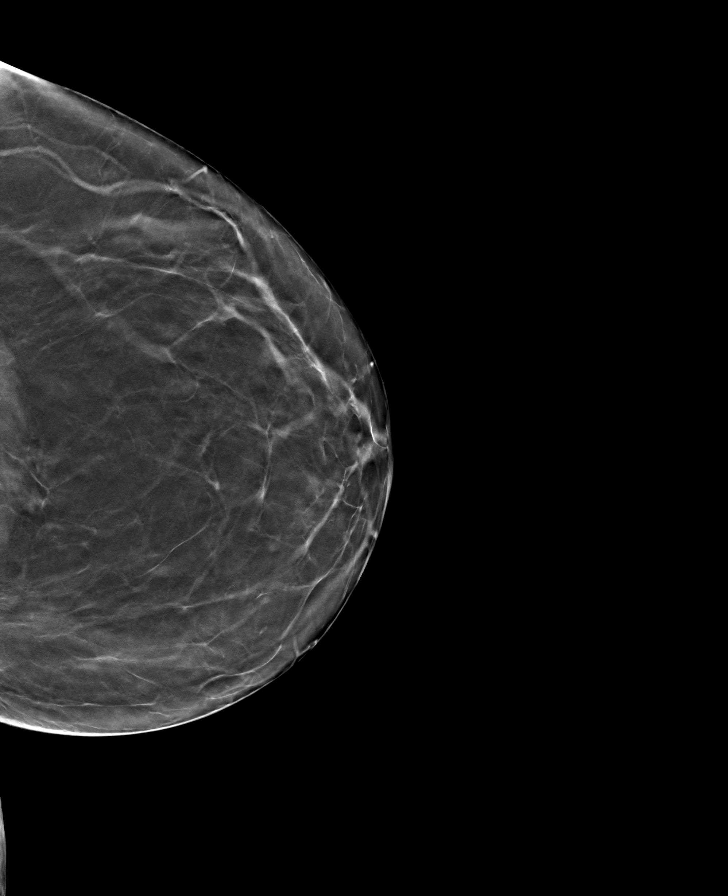

[L MLO tomo · tomo slice 35/69.0]
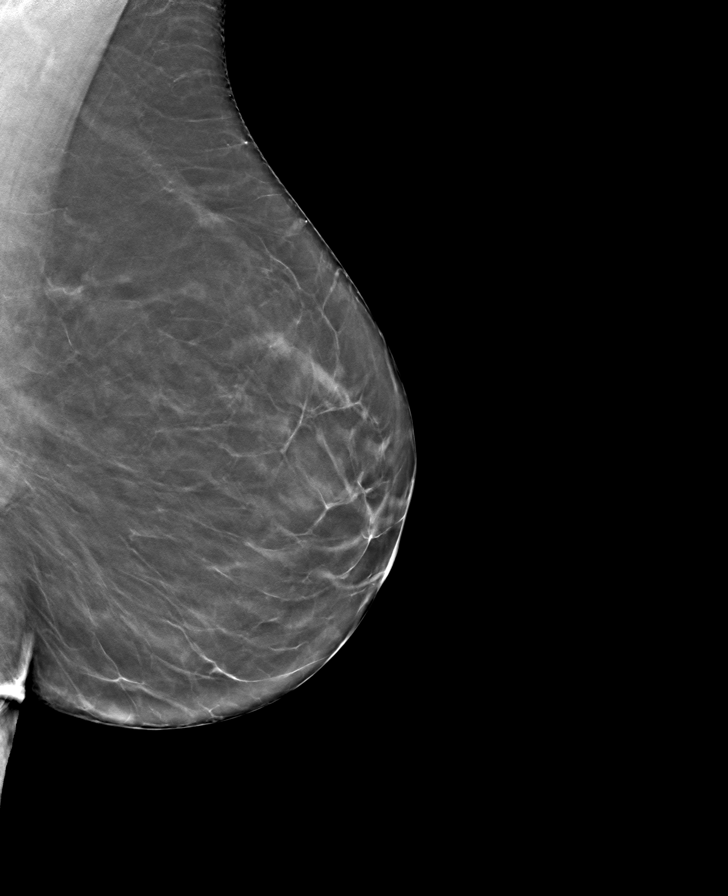

[8 of 24 positions shown; findings below may reference images not displayed]

ACR Breast Density Category b: There are scattered areas of
fibroglandular density.
FINDINGS: There are no findings suspicious for malignancy.
IMPRESSION: No mammographic evidence of malignancy. A result letter of this
screening mammogram will be mailed directly to the patient.

RECOMMENDATION:
Screening mammogram in one year. (Code:51-O-LD2)

BI-RADS CATEGORY  1: Negative.

## 2023-05-10 ENCOUNTER — Encounter (INDEPENDENT_AMBULATORY_CARE_PROVIDER_SITE_OTHER): Payer: Self-pay | Admitting: *Deleted

## 2023-05-17 ENCOUNTER — Ambulatory Visit (INDEPENDENT_AMBULATORY_CARE_PROVIDER_SITE_OTHER)

## 2023-05-17 ENCOUNTER — Ambulatory Visit: Admitting: Podiatry

## 2023-05-17 DIAGNOSIS — M722 Plantar fascial fibromatosis: Secondary | ICD-10-CM | POA: Diagnosis not present

## 2023-05-17 DIAGNOSIS — T148XXA Other injury of unspecified body region, initial encounter: Secondary | ICD-10-CM

## 2023-05-17 MED ORDER — PREDNISONE 10 MG PO TABS: ORAL_TABLET | ORAL | 0 refills | Status: DC

## 2023-05-17 NOTE — Progress Notes (Addendum)
 Subjective:   Patient ID: Maria Becker, female   DOB: 63 y.o.   MRN: [id number]   HPI Patient presents with severe pain in the heel region bilateral and is developing severe pain in the lateral ankle and into the peroneal group at insertion.  I noted fluid buildup in that area and it is hurting just now as much as the plantar fascia. Patient has been doing exercises for last several months that I had written and given to her at last visit. Patient has been doing aggressive stretching to help condition but still swelling hurting no matter what she tries   ROS      Objective:  Physical Exam  Severe Planter fasciitis compensatory peroneal tendinitis but at this point due to the intensity of discomfort and swelling I cannot rule out interstitial tearing either behind the malleolus of her distal     Assessment:  Planter fasciitis peroneal tendinitis cannot rule out tear of the peroneal tendon     Plan:  H&P reviewed and I went ahead today and I have recommended the continuation of ice therapy and then we went ahead today and MRI will be ordered for each ankle to rule out tear

## 2023-05-23 ENCOUNTER — Telehealth: Payer: Self-pay

## 2023-05-23 NOTE — Telephone Encounter (Signed)
 MRI PA was denied. I supplied all the notes that were available and insurance denied it because.     Reference # [id number]

## 2023-05-23 NOTE — Telephone Encounter (Signed)
I added to the note.

## 2023-05-25 NOTE — Telephone Encounter (Signed)
 Updated note faxed to [facility].

## 2023-05-28 NOTE — Telephone Encounter (Signed)
 Additional information was sent and this was the response     In the initial denial letter it states that notes need to show 4 weeks of exercises at home that were given to patient within the last six months. The notes should states what exercises were give, how often done and how long to do them (how much time each day) and also that patient did not get better. An appeal can be done

## 2023-06-13 DIAGNOSIS — B078 Other viral warts: Secondary | ICD-10-CM | POA: Diagnosis not present

## 2023-06-13 DIAGNOSIS — D225 Melanocytic nevi of trunk: Secondary | ICD-10-CM | POA: Diagnosis not present

## 2023-06-13 DIAGNOSIS — L820 Inflamed seborrheic keratosis: Secondary | ICD-10-CM | POA: Diagnosis not present

## 2023-06-13 DIAGNOSIS — D485 Neoplasm of uncertain behavior of skin: Secondary | ICD-10-CM | POA: Diagnosis not present

## 2023-06-13 DIAGNOSIS — L821 Other seborrheic keratosis: Secondary | ICD-10-CM | POA: Diagnosis not present

## 2023-06-13 DIAGNOSIS — D3617 Benign neoplasm of peripheral nerves and autonomic nervous system of trunk, unspecified: Secondary | ICD-10-CM | POA: Diagnosis not present

## 2023-06-15 ENCOUNTER — Other Ambulatory Visit

## 2023-06-20 ENCOUNTER — Ambulatory Visit: Admitting: Orthopaedic Surgery

## 2023-06-20 ENCOUNTER — Other Ambulatory Visit (INDEPENDENT_AMBULATORY_CARE_PROVIDER_SITE_OTHER): Payer: Self-pay

## 2023-06-20 ENCOUNTER — Encounter: Payer: Self-pay | Admitting: Orthopaedic Surgery

## 2023-06-20 VITALS — Ht 66.0 in | Wt 224.0 lb

## 2023-06-20 DIAGNOSIS — G8929 Other chronic pain: Secondary | ICD-10-CM

## 2023-06-20 DIAGNOSIS — M25561 Pain in right knee: Secondary | ICD-10-CM | POA: Diagnosis not present

## 2023-06-20 MED ORDER — METHYLPREDNISOLONE ACETATE 40 MG/ML IJ SUSP
40.0000 mg | INTRAMUSCULAR | Status: AC | PRN
  Administered 2023-06-20: 40 mg via INTRA_ARTICULAR

## 2023-06-20 MED ORDER — LIDOCAINE HCL 1 % IJ SOLN
3.0000 mL | INTRAMUSCULAR | Status: AC | PRN
Start: 2023-06-20 — End: 2023-06-20
  Administered 2023-06-20: 3 mL

## 2023-06-20 NOTE — Progress Notes (Signed)
 The patient is well-known to [place].  She is 63 years old but appears much younger.  She likes to be 63 coming up.  We replaced her left knee back in 2023 that was due to significant left knee arthritis that was seen mainly on a MRI of her knee.  She is now been having right knee pain that is worse in the back of the knee and with weightbearing.  She has been dealing with plantar fasciitis for a long period of time and this may have thrown her gait off.  Her BMI is 36.15.  About 3 months ago she did start wearing new shoes from [employer] to see if this would help.  On examination of her right knee there is no effusion.  There is posterior medial pain with some patellofemoral crepitation.  There is also valgus malalignment of the knee.  X-rays of the right knee today show a small loose body in the front of the knee and patellofemoral narrowing.  There is slight valgus malalignment.  She did wish to try steroid injection in her right knee today because this has helped in the past.  We did place a steroid injection without difficulty and we will see her back in about 6 weeks to see if this has helped.  If this does not help we will definitely order a MRI of her right knee.    Procedure Note  Patient: Maria Becker             Date of Birth: Aug 27, 1960           MRN: [id number]             Visit Date: 06/20/2023  Procedures: Visit Diagnoses:  1. Chronic pain of right knee     Large Joint Inj: R knee on 06/20/2023 10:17 AM Indications: diagnostic evaluation and pain Details: 22 G 1.5 in needle, superolateral approach  Arthrogram: No  Medications: 3 mL lidocaine 1 %; 40 mg methylPREDNISolone acetate 40 MG/ML Outcome: tolerated well, no immediate complications Procedure, treatment alternatives, risks and benefits explained, specific risks discussed. Consent was given by the patient. Immediately prior to procedure a time out was called to verify the correct patient, procedure, equipment, support  staff and site/side marked as required. Patient was prepped and draped in the usual sterile fashion.

## 2023-06-27 NOTE — Telephone Encounter (Signed)
 Last note should work

## 2023-07-11 ENCOUNTER — Ambulatory Visit: Admitting: Adult Health

## 2023-07-11 ENCOUNTER — Other Ambulatory Visit (HOSPITAL_COMMUNITY)
Admission: RE | Admit: 2023-07-11 | Discharge: 2023-07-11 | Disposition: A | Discharge status: Home / Self Care | Source: Ambulatory Visit | Attending: Adult Health | Admitting: Adult Health

## 2023-07-11 ENCOUNTER — Encounter: Payer: Self-pay | Admitting: Adult Health

## 2023-07-11 VITALS — BP 124/84 | HR 86 | Ht 67.0 in | Wt 221.0 lb

## 2023-07-11 DIAGNOSIS — Z01419 Encounter for gynecological examination (general) (routine) without abnormal findings: Secondary | ICD-10-CM | POA: Diagnosis not present

## 2023-07-11 DIAGNOSIS — N3941 Urge incontinence: Secondary | ICD-10-CM | POA: Diagnosis not present

## 2023-07-11 DIAGNOSIS — N816 Rectocele: Secondary | ICD-10-CM

## 2023-07-11 DIAGNOSIS — L918 Other hypertrophic disorders of the skin: Secondary | ICD-10-CM | POA: Diagnosis not present

## 2023-07-11 DIAGNOSIS — Z90711 Acquired absence of uterus with remaining cervical stump: Secondary | ICD-10-CM | POA: Diagnosis not present

## 2023-07-11 DIAGNOSIS — Z1151 Encounter for screening for human papillomavirus (HPV): Secondary | ICD-10-CM | POA: Diagnosis not present

## 2023-07-11 DIAGNOSIS — Z1211 Encounter for screening for malignant neoplasm of colon: Secondary | ICD-10-CM

## 2023-07-11 LAB — HEMOCCULT GUIAC POC 1CARD (OFFICE): Fecal Occult Blood, POC: NEGATIVE

## 2023-07-11 MED ORDER — SOLIFENACIN SUCCINATE 5 MG PO TABS: 5.0000 mg | ORAL_TABLET | Freq: Every day | ORAL | 3 refills | Status: AC

## 2023-07-11 NOTE — Progress Notes (Signed)
 Patient ID: Maria Becker, female   DOB: 10/17/1960, 63 y.o.   MRN: [id number] History of Present Illness:  Maria Becker is a 63 year old white female, married, sp [facility] in for a well woman gyn exam and pap. She is a new pt. She says it has been over 10 years since last pap. She is a Producer, television/film/video.  She has been without insurance but now [employer].   PCP is Dr Del Favia  Current Medications, Allergies, Past Medical History, Past Surgical History, Family History and Social History were reviewed in [employer] electronic medical record.     Review of Systems: Patient denies any headaches, hearing loss, fatigue, blurred vision, shortness of breath, chest pain, abdominal pain, problems with bowel movements, has constipation all her life, has urge incontinence  or intercourse(not active). No joint pain or mood swings.  +weight gain after left knee replacement and then had plantar fascitis   Physical Exam:BP 124/84 (BP Location: Right Arm, Patient Position: Sitting, Cuff Size: Large)   Pulse 86   Ht 5\' 7"  (1.702 m)   Wt 221 lb (100.2 kg)   BMI 34.61 kg/m   General:  Well developed, well nourished, no acute distress Skin:  Warm and dry Neck:  Midline trachea, normal thyroid , good ROM, no lymphadenopathy, no carotidf bruits heard  Lungs; Clear to auscultation bilaterally Breast:  No dominant palpable mass, retraction, or nipple discharge Cardiovascular: Regular rate and rhythm Abdomen:  Soft, non tender, no hepatosplenomegaly Pelvic:  External genitalia is normal in appearance, no lesions. Skin tag right inner thigh. The vagina is pale. Urethra has no lesions or masses. The cervix is smooth, pap with HR HPV genotyping performed.  Uterus is absent.  No adnexal masses or tenderness noted.Bladder is non tender, no masses felt. Rectal: Good sphincter tone, no polyps, or hemorrhoids felt.  Hemoccult negative. +rectocele Extremities/musculoskeletal:  No swelling or varicosities noted, no clubbing or  cyanosis Psych:  No mood changes, alert and cooperative,seems happy AA is 1 Fall risk is low    07/11/2023   11:35 AM  Depression screen PHQ 2/9  Decreased Interest 0  Down, Depressed, Hopeless 0  PHQ - 2 Score 0  Altered sleeping 1  Tired, decreased energy 1  Change in appetite 1  Feeling bad or failure about yourself  1  Trouble concentrating 0  Moving slowly or fidgety/restless 0  Suicidal thoughts 0  PHQ-9 Score 4       07/11/2023   11:35 AM  GAD 7 : Generalized Anxiety Score  Nervous, Anxious, on Edge 0  Control/stop worrying 1  Worry too much - different things 1  Trouble relaxing 1  Restless 0  Easily annoyed or irritable 0  Afraid - awful might happen 1  Total GAD 7 Score 4    Upstream - 07/11/23 1150       Pregnancy Intention Screening   Does the patient want to become pregnant in the next year? N/A    Does the patient's partner want to become pregnant in the next year? N/A    Would the patient like to discuss contraceptive options today? N/A      Contraception Wrap Up   Current Method Female Sterilization   [facility]   End Method Female Sterilization   [facility]   Contraception Counseling Provided No              Examination chaperoned by Alphonso Aschoff LPN    Impression and plan: 1. Encounter for gynecological examination with  Papanicolaou smear of cervix (Primary) Pap sent Pap in 3 years if normal Physical in 1 year Labs with PCP Had mammogram 2024 at [facility] Colonoscopy in near future - Cytology - PAP( [place]) She is walking now   2. Encounter for screening fecal occult blood testing Hemoccult was negative  - POCT occult blood stool  3. Skin tag Has skin tag righ inner thigh, can remove when she wants   4. Urge incontinence of urine +UI will try kegel exercises  Weight loss  Will rx vesicare 5 mg 1 daily Meds ordered this encounter  Medications   solifenacin (VESICARE) 5 MG tablet    Sig: Take 1 tablet (5 mg total) by mouth daily.     Dispense:  30 tablet    Refill:  3    Supervising Provider:   Randolm Butte, LUTHER H [[id number]]     5. S/P laparoscopic supracervical hysterectomy  6. Rectocele

## 2023-07-13 LAB — CYTOLOGY - PAP
Comment: NEGATIVE
Diagnosis: NEGATIVE
High risk HPV: NEGATIVE

## 2023-07-20 DIAGNOSIS — H04123 Dry eye syndrome of bilateral lacrimal glands: Secondary | ICD-10-CM | POA: Diagnosis not present

## 2023-07-20 DIAGNOSIS — H1045 Other chronic allergic conjunctivitis: Secondary | ICD-10-CM | POA: Diagnosis not present

## 2023-07-20 DIAGNOSIS — H25813 Combined forms of age-related cataract, bilateral: Secondary | ICD-10-CM | POA: Diagnosis not present

## 2023-07-20 DIAGNOSIS — H43822 Vitreomacular adhesion, left eye: Secondary | ICD-10-CM | POA: Diagnosis not present

## 2023-07-30 ENCOUNTER — Encounter: Payer: Self-pay | Admitting: Orthopaedic Surgery

## 2023-07-30 ENCOUNTER — Ambulatory Visit: Admitting: Orthopaedic Surgery

## 2023-07-30 DIAGNOSIS — G8929 Other chronic pain: Secondary | ICD-10-CM

## 2023-07-30 DIAGNOSIS — M25561 Pain in right knee: Secondary | ICD-10-CM

## 2023-07-30 NOTE — Progress Notes (Signed)
 The patient comes in for continued follow-up as a relates to locking and catching of her right knee.  Her x-rays show questionable loose body of that knee.  We have tried conservative treatment with activity modification, anti-inflammatories, rest, quad strengthening exercises and home exercise program.  She did not need formal physical therapy because we have replaced her left knee before and she has done all the same exercises that she learned during recovery from her left knee replacement.  We have placed a steroid injection in the left knee and that is helped some but she still having a lot of mechanical symptoms with locking and catching.  Examination her right knee shows it does slightly hyperextend.  She has good range of motion of the knee and a positive McMurray sign to the posterior medial aspect of the knee.  I am concerned there is a loose body and the potential for meniscal tear given her mechanical symptoms and the failure conservative treatment.  This point we will order a MRI of her right knee to rule out a meniscal tear and to assess for loose bodies.  She would like at least a 6-day steroid taper we will send this in for her and then see her back in follow-up once we have the MRI of her right knee.  She agrees with this treatment plan.

## 2023-07-31 ENCOUNTER — Other Ambulatory Visit: Payer: Self-pay

## 2023-07-31 DIAGNOSIS — G8929 Other chronic pain: Secondary | ICD-10-CM

## 2023-08-03 ENCOUNTER — Ambulatory Visit
Admission: RE | Admit: 2023-08-03 | Discharge: 2023-08-03 | Disposition: A | Discharge status: Home / Self Care | Source: Ambulatory Visit | Attending: Orthopaedic Surgery | Admitting: Orthopaedic Surgery

## 2023-08-03 DIAGNOSIS — M23341 Other meniscus derangements, anterior horn of lateral meniscus, right knee: Secondary | ICD-10-CM | POA: Diagnosis not present

## 2023-08-03 DIAGNOSIS — M25561 Pain in right knee: Secondary | ICD-10-CM | POA: Diagnosis not present

## 2023-08-03 DIAGNOSIS — G8929 Other chronic pain: Secondary | ICD-10-CM

## 2023-08-03 DIAGNOSIS — M1711 Unilateral primary osteoarthritis, right knee: Secondary | ICD-10-CM | POA: Diagnosis not present

## 2023-08-03 DIAGNOSIS — M25461 Effusion, right knee: Secondary | ICD-10-CM | POA: Diagnosis not present

## 2023-08-07 ENCOUNTER — Telehealth: Payer: Self-pay | Admitting: Orthopaedic Surgery

## 2023-08-07 ENCOUNTER — Other Ambulatory Visit: Payer: Self-pay | Admitting: Orthopaedic Surgery

## 2023-08-07 ENCOUNTER — Telehealth: Payer: Self-pay

## 2023-08-07 NOTE — Telephone Encounter (Signed)
 Left message @ 4:15 pm. Pt did see her pap results on Mychart. Advised if any questions, can send a mychart message or call. JSY

## 2023-08-07 NOTE — Telephone Encounter (Signed)
Pt would like to know the results of her pap smear

## 2023-08-07 NOTE — Telephone Encounter (Signed)
 Patient called and she didn't receive the medication that he was going to sent in. CB#[phone]

## 2023-08-08 ENCOUNTER — Other Ambulatory Visit: Payer: Self-pay | Admitting: Orthopaedic Surgery

## 2023-08-08 MED ORDER — METHYLPREDNISOLONE 4 MG PO TABS: ORAL_TABLET | ORAL | 0 refills | Status: DC

## 2023-08-13 ENCOUNTER — Ambulatory Visit: Admission: EM | Admit: 2023-08-13 | Discharge: 2023-08-13 | Disposition: A | Discharge status: Home / Self Care

## 2023-08-13 DIAGNOSIS — Z23 Encounter for immunization: Secondary | ICD-10-CM

## 2023-08-13 DIAGNOSIS — S91112A Laceration without foreign body of left great toe without damage to nail, initial encounter: Secondary | ICD-10-CM

## 2023-08-13 MED ORDER — TETANUS-DIPHTH-ACELL PERTUSSIS 5-2.5-18.5 LF-MCG/0.5 IM SUSY
0.5000 mL | PREFILLED_SYRINGE | Freq: Once | INTRAMUSCULAR | Status: AC
Start: 2023-08-13 — End: 2023-08-13
  Administered 2023-08-13: 0.5 mL via INTRAMUSCULAR

## 2023-08-13 NOTE — Discharge Instructions (Addendum)
 6 sutures were placed to your left great toe.  Your tdap was updated today, it is good for the next 10 years.  Keep the dressing in place for 24 hours. Remove the dressing and clean the area with warm soap and water   in 24 hours. Clean the area daily and apply a bandage or dressing. When you are at home, you may leave the area open to air.  When you are out, recommend keeping the area covered. Wear shoes that do not rub or irritate the sutures. May apply Neosporin to the laceration as needed. May take over-the-counter ibuprofen or Tylenol  for pain or discomfort. Avoid repetitive weight bearing for the next 72 hours. Avoid strenous activities while the sutures are in place. Apply ice to the left great toe to help with pain or swelling. Follow-up immediately if you develop increased swelling, redness to the toe or extends into the left foot, foul-smelling drainage, or if you develop fever, chills, or other concerns. Keep the sutures in place for 10 days.  You will follow-up on 08/23/23 for suture removal. Follow-up as needed.

## 2023-08-13 NOTE — ED Triage Notes (Signed)
 Pt states she cut her left big toe this morning when washing dishes. A glass dish fell and now have a laceration on the top of her left big toe.  Last tdap was 2010. Would like to update it while here.

## 2023-08-13 NOTE — ED Provider Notes (Signed)
 [facility]    CSN: [id number] Arrival date & time: 08/13/23  1011      History   Chief Complaint Chief Complaint  Patient presents with   Laceration    HPI Maria Becker is a 63 y.o. female.   The history is provided by the patient.   Patient presents for a laceration to the left great toe after she accidentally dropped a glass dish on the top of her foot.  Patient states injury occurred this morning.  States initially after the injury occurred she did have some numbness in the left great toe.  Now she states that she is experiencing pain.  She states when she stands, the area begins to bleed more.  Denies tingling or radiation of pain.  Patient denies use of blood thinning medications.  Last Tdap was in 2010.    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 cath 07/2015: 30-50% mid-LAD stenosis but otherwise normal cors   Diastolic CHF, chronic (HCC)    Dyspnea    SOME SOB WITH EXERTION   Family history of adverse reaction to anesthesia    MOTHER SLOW TO WAKE UP   Fibromyalgia    GERD (gastroesophageal reflux disease)    Headache    History of kidney stones    HTN (hypertension) 07/20/2015   Hyperlipidemia    a. 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   Unilateral primary osteoarthritis, left knee 06/13/2021   Dyspnea on exertion    Angina decubitus (HCC) 07/20/2015   CAD (coronary artery disease), native coronary  artery, LAD on cardiac CT 07/20/2015   HTN (hypertension) 07/20/2015   Family history of premature CAD 07/20/2015   Hyperlipidemia LDL goal <70 07/20/2015   Angina at rest [facility]) 07/20/2015   Abnormal nuclear stress test    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    Past Surgical History:  Procedure Laterality Date   ABDOMINAL HYSTERECTOMY     BACK SURGERY     CARDIAC CATHETERIZATION N/A 07/20/2015   Procedure: Left Heart Cath and Coronary Angiography;  Surgeon: Arty Binning, MD;  Location: [facility] INVASIVE CV LAB;  Service: Cardiovascular;  Laterality: N/A;   CESAREAN SECTION     L4-L5 fusion     RIGHT/LEFT HEART CATH AND CORONARY ANGIOGRAPHY N/A 04/15/2021   Procedure: RIGHT/LEFT HEART CATH AND CORONARY ANGIOGRAPHY;  Surgeon: Odie Benne, MD;  Location: [facility] INVASIVE CV LAB;  Service: Cardiovascular;  Laterality: N/A;   TOTAL KNEE ARTHROPLASTY Left 07/22/2021   Procedure: LEFT TOTAL KNEE ARTHROPLASTY;  Surgeon: Arnie Lao, MD;  Location: [facility];  Service: Orthopedics;  Laterality: Left;  RFNA    OB History     Gravida  3   Para  2   Term  2   Preterm      AB  1   Living  2      SAB  1  IAB      Ectopic      Multiple      Live Births  2            Home Medications    Prior to Admission medications   Medication Sig Start Date End Date Taking? Authorizing Provider  b complex vitamins tablet Take 1 tablet by mouth 2 (two) times a week.   Yes [provider]  MAGNESIUM  PO Take 250 mg by mouth at bedtime.   Yes [provider]  methylPREDNISolone  (MEDROL ) 4 MG tablet Medrol  dose pack. Take as instructed 08/08/23  Yes Arnie Lao, MD  Vitamin D , Ergocalciferol , (DRISDOL ) 1.25 MG (50000 UNIT) CAPS capsule Take 50,000 Units by mouth every Monday.   Yes [provider]  aspirin  EC 81 MG tablet Take 1 tablet (81 mg total) by mouth daily. Swallow whole. 05/11/22   Dunn, Dayna N, PA-C  fluticasone (FLONASE) 50  MCG/ACT nasal spray Place 1 spray into both nostrils.    [provider]  furosemide  (LASIX ) 40 MG tablet Take one (1) tablet by mouth ( 40 mg) as needed for swelling. 05/11/22   Dunn, Dayna N, PA-C  liothyronine  (CYTOMEL ) 5 MCG tablet Take 10 mcg by mouth daily. 06/07/15   [provider]  methylPREDNISolone  (MEDROL  DOSEPAK) 4 MG TBPK tablet Take by mouth as directed. 08/08/23   [provider]  Multiple Vitamins-Minerals (PRESERVISION AREDS) CAPS Take 1 capsule by mouth daily.    [provider]  omeprazole (PRILOSEC) 40 MG capsule Take 40 mg by mouth daily.    [provider]  rosuvastatin  (CRESTOR ) 10 MG tablet Take 10 mg by mouth 3 (three) times a week.    [provider]  solifenacin  (VESICARE ) 5 MG tablet Take 1 tablet (5 mg total) by mouth daily. 07/11/23 07/10/24  Javan Messing, NP  tamsulosin (FLOMAX) 0.4 MG CAPS capsule Take 0.4 mg by mouth daily as needed (Kidnsy stone).    [provider]  UNABLE TO FIND Vitalax-2 tabs qhs    [provider]  vitamin B-12 (CYANOCOBALAMIN) 1000 MCG tablet Take 1,000 mcg by mouth daily.    [provider]  VITAMIN E PO Take 50 Units by mouth 2 (two) times a week.    [provider]    Family History Family History  Problem Relation Age of Onset   Diabetes Paternal Grandfather    Heart disease Paternal Grandfather    Diabetes Paternal Grandmother    Heart disease Paternal Grandmother    Diabetes Maternal Grandmother    Heart disease Maternal Grandmother    Diabetes Maternal Grandfather    Heart disease Maternal Grandfather    Melanoma Father    Diabetes Father    Macular degeneration Mother    COPD Mother    Heart attack Mother 63   Diabetes Mother    Aneurysm Mother    Diabetes Brother    Heart attack Brother 59   Kidney disease Brother    Heart disease Sister    Diabetes Sister    Macular degeneration Sister    Macular degeneration Maternal  Aunt    Cancer - Colon Maternal Aunt    Kidney disease Maternal Aunt    Diabetes Maternal Aunt    Aneurysm Maternal Uncle    Heart attack Paternal Aunt    Hypothyroidism Paternal Aunt    Aneurysm Cousin     Social History Social History   Tobacco Use   Smoking status: Never  Smokeless tobacco: Never  Vaping Use   Vaping status: Never Used  Substance Use Topics   Alcohol use: Yes    Comment: occasional   Drug use: No     Allergies   Statins and Other   Review of Systems Review of Systems Per HPI  Physical Exam Triage Vital Signs ED Triage Vitals  Encounter Vitals Group     BP 08/13/23 1024 (!) 153/85     Systolic BP Percentile --      Diastolic BP Percentile --      Pulse Rate 08/13/23 1024 72     Resp 08/13/23 1024 16     Temp 08/13/23 1024 (!) 97.4 F (36.3 C)     Temp Source 08/13/23 1024 Oral     SpO2 08/13/23 1024 97 %     Weight --      Height --      Head Circumference --      Peak Flow --      Pain Score 08/13/23 1025 0     Pain Loc --      Pain Education --      Exclude from Growth Chart --    No data found.  Updated Vital Signs BP (!) 153/85 (BP Location: Right Arm)   Pulse 72   Temp (!) 97.4 F (36.3 C) (Oral)   Resp 16   SpO2 97%   Visual Acuity Right Eye Distance:   Left Eye Distance:   Bilateral Distance:    Right Eye Near:   Left Eye Near:    Bilateral Near:     Physical Exam Vitals and nursing note reviewed.  Constitutional:      General: She is not in acute distress.    Appearance: Normal appearance.  HENT:     Head: Normocephalic.  Eyes:     Extraocular Movements: Extraocular movements intact.     Pupils: Pupils are equal, round, and reactive to light.  Pulmonary:     Effort: Pulmonary effort is normal.  Musculoskeletal:     Cervical back: Normal range of motion.       Feet:  Feet:     Left foot:     Skin integrity: Skin integrity normal. No skin breakdown or erythema.     Toenail Condition: Left toenails  are normal.  Skin:    General: Skin is warm and dry.  Neurological:     Mental Status: She is alert and oriented to person, place, and time.  Psychiatric:        Mood and Affect: Mood normal.        Behavior: Behavior normal.      UC Treatments / Results  Labs (all labs ordered are listed, but only abnormal results are displayed) Labs Reviewed - No data to display  EKG   Radiology No results found.  Procedures Laceration Repair  Date/Time: 08/13/2023 12:19 PM  Performed by: Hardy Lia, NP Authorized by: Hardy Lia, NP   Consent:    Consent obtained:  Verbal   Consent given by:  Patient   Risks, benefits, and alternatives were discussed: yes     Risks discussed:  Need for additional repair, pain, poor wound healing and infection   Alternatives discussed:  No treatment Universal protocol:    Procedure explained and questions answered to patient or proxy's satisfaction: yes     Patient identity confirmed:  Verbally with patient Anesthesia:    Anesthesia method:  Local infiltration Laceration details:  Location:  Toe   Toe location:  L big toe   Length (cm):  2.5 Exploration:    Contaminated: no   Treatment:    Area cleansed with:  Saline (NS and hibiclens  soap)   Amount of cleaning:  Standard   Irrigation method:  Tap Skin repair:    Repair method:  Sutures   Suture size:  3-0   Suture technique:  Simple interrupted   Number of sutures:  6 Approximation:    Approximation:  Close Repair type:    Repair type:  Simple Post-procedure details:    Dressing:  Antibiotic ointment and non-adherent dressing Comments:     Approximate 2.5 cm laceration noted to the left great toe.  Site was cleansed with Hibiclens  and NS.  Local anesthesia provided with lidocaine  2% without epi, 3 mL used for anesthetization.  Laceration repaired with 6 simple interrupted sutures.  Edges are well-approximated postprocedure.  Antibiotic ointment, nonadherent  gauze, and Coban applied post procedure.  Patient tolerated the procedure well.   (including critical care time)  Medications Ordered in UC Medications  Tdap (BOOSTRIX) injection 0.5 mL (0.5 mLs Intramuscular Given 08/13/23 1142)    Initial Impression / Assessment and Plan / UC Course  I have reviewed the triage vital signs and the nursing notes.  Pertinent labs & imaging results that were available during my care of the patient were reviewed by me and considered in my medical decision making (see chart for details).  6 simple interrupted sutures were placed to the left great toe.  Tdap was updated today.  Supportive care recommendations were provided and discussed with the patient to include keeping the area clean and dry, wearing shoes that do not rub or irritate the sutures, over-the-counter analgesics, minimized nonweightbearing for the next 72 hours.  And the use of ice to help with pain or swelling.  Sutures to be removed in 10 days.  Discussed indications with patient regarding follow-up.  Patient was in agreement with this plan of care and verbalized understanding.  All questions were answered.  Patient stable for discharge.   Final Clinical Impressions(s) / UC Diagnoses   Final diagnoses:  Laceration of left great toe without foreign body present or damage to nail, initial encounter   Discharge Instructions      6 sutures were placed to your left great toe.  Your tdap was updated today, it is good for the next 10 years.  Keep the dressing in place for 24 hours. Remove the dressing and clean the area with warm soap and water   in 24 hours. Clean the area daily and apply a bandage or dressing. When you are at home, you may leave the area open to air.  When you are out, recommend keeping the area covered. Wear shoes that do not rub or irritate the sutures. May apply Neosporin to the laceration as needed. May take over-the-counter ibuprofen or Tylenol  for pain or discomfort. Avoid  repetitive weight bearing for the next 72 hours. Avoid strenous activities while the sutures are in place. Apply ice to the left great toe to help with pain or swelling. Follow-up immediately if you develop increased swelling, redness to the toe or extends into the left foot, foul-smelling drainage, or if you develop fever, chills, or other concerns. Keep the sutures in place for 10 days.  You will follow-up on 08/23/23 for suture removal. Follow-up as needed.   ED Prescriptions   None    PDMP not reviewed this encounter.  Hardy Lia, NP 08/13/23 1311

## 2023-08-16 DIAGNOSIS — H43822 Vitreomacular adhesion, left eye: Secondary | ICD-10-CM | POA: Diagnosis not present

## 2023-08-16 DIAGNOSIS — H40022 Open angle with borderline findings, high risk, left eye: Secondary | ICD-10-CM | POA: Diagnosis not present

## 2023-08-16 DIAGNOSIS — H1045 Other chronic allergic conjunctivitis: Secondary | ICD-10-CM | POA: Diagnosis not present

## 2023-08-16 DIAGNOSIS — H04123 Dry eye syndrome of bilateral lacrimal glands: Secondary | ICD-10-CM | POA: Diagnosis not present

## 2023-08-16 DIAGNOSIS — H33191 Other retinoschisis and retinal cysts, right eye: Secondary | ICD-10-CM | POA: Diagnosis not present

## 2023-08-16 DIAGNOSIS — H35371 Puckering of macula, right eye: Secondary | ICD-10-CM | POA: Diagnosis not present

## 2023-08-16 DIAGNOSIS — H2513 Age-related nuclear cataract, bilateral: Secondary | ICD-10-CM | POA: Diagnosis not present

## 2023-08-20 ENCOUNTER — Encounter: Payer: Self-pay | Admitting: Physician Assistant

## 2023-08-20 ENCOUNTER — Ambulatory Visit (INDEPENDENT_AMBULATORY_CARE_PROVIDER_SITE_OTHER): Admitting: Physician Assistant

## 2023-08-20 DIAGNOSIS — M722 Plantar fascial fibromatosis: Secondary | ICD-10-CM

## 2023-08-20 DIAGNOSIS — M1711 Unilateral primary osteoarthritis, right knee: Secondary | ICD-10-CM

## 2023-08-20 NOTE — Progress Notes (Signed)
 HPI: [name] returns today to go over the MRI of her right knee.  She continues to have significant pain in her right knee but no mechanical symptoms.  She is also recovering from a laceration to her left great toe and has bilateral plantar fasciitis that she has seen a podiatrist. Right knee MRI was reviewed with the patient.  Images reviewed.  Tricompartmental arthritic changes.  Lateral facet of patella with near full-thickness cartilage loss and subchondral changes.  Lateral tibial plateau reactive edema with near full-thickness loss.  Medial meniscus unremarkable.  Abnormal signal seen in the anterior horn and anterior body of the lateral meniscus no obvious tear.  Cannot rule out nondisplaced horizontal tear of the anterior horn and body. Bilateral feet lateral views standing dated 05/17/2023 are reviewed shows slight calcaneal spur left foot only.  No acute fractures or other bony abnormalities either foot.  Review of systems: See HPI  Physical exam: Right knee nontender medial joint line tenderness along the lateral joint line.  Tenderness over the peripatellar region.  Significant patellofemoral crepitus.  Slight valgus deformity.  Quad atrophy.  Impression: Right knee arthritis Bilateral plantar fasciitis  Plan: Discussed with her the importance of quad strengthening quad strengthening exercises and the need for knee exercises discussed.  Will have her start taking Celebrex which she has at home 200 mg daily for the next couple weeks.  She can also try turmeric or dried cherries safely helpful for her knee arthritis.  In regards to her heels she does wearing open back shoes today discussed with her the need for a shoe with a back or arch strap.  Gastrocsoleus stretching exercises discussed.  Will send her for shockwave therapy to her heels with Dr. Vaughn Georges.  Also send her to formal physical therapy for quad strengthening home exercise program bilateral knees and gastroc soleus stretching  they will include modalities to the heels.  She may benefit from viscosupplementation injection in the right knee if pain persists.  She will let us  know.  Questions were encouraged and answered

## 2023-08-23 ENCOUNTER — Ambulatory Visit

## 2023-08-23 ENCOUNTER — Ambulatory Visit
Admission: RE | Admit: 2023-08-23 | Discharge: 2023-08-23 | Disposition: A | Discharge status: Home / Self Care | Source: Ambulatory Visit | Attending: Internal Medicine | Admitting: Internal Medicine

## 2023-08-23 ENCOUNTER — Other Ambulatory Visit: Payer: Self-pay

## 2023-08-23 DIAGNOSIS — M1711 Unilateral primary osteoarthritis, right knee: Secondary | ICD-10-CM | POA: Diagnosis not present

## 2023-08-23 NOTE — ED Triage Notes (Signed)
 Pt reports needing suture removal.

## 2023-09-03 ENCOUNTER — Ambulatory Visit: Admitting: Physical Therapy

## 2023-09-03 ENCOUNTER — Encounter: Payer: Self-pay | Admitting: Physical Therapy

## 2023-09-03 ENCOUNTER — Ambulatory Visit: Admitting: Sports Medicine

## 2023-09-03 ENCOUNTER — Encounter: Payer: Self-pay | Admitting: Sports Medicine

## 2023-09-03 DIAGNOSIS — G8929 Other chronic pain: Secondary | ICD-10-CM | POA: Diagnosis not present

## 2023-09-03 DIAGNOSIS — M722 Plantar fascial fibromatosis: Secondary | ICD-10-CM | POA: Diagnosis not present

## 2023-09-03 DIAGNOSIS — M79671 Pain in right foot: Secondary | ICD-10-CM | POA: Diagnosis not present

## 2023-09-03 DIAGNOSIS — M6281 Muscle weakness (generalized): Secondary | ICD-10-CM | POA: Diagnosis not present

## 2023-09-03 DIAGNOSIS — M216X1 Other acquired deformities of right foot: Secondary | ICD-10-CM

## 2023-09-03 DIAGNOSIS — M216X2 Other acquired deformities of left foot: Secondary | ICD-10-CM | POA: Diagnosis not present

## 2023-09-03 DIAGNOSIS — R2689 Other abnormalities of gait and mobility: Secondary | ICD-10-CM

## 2023-09-03 DIAGNOSIS — G5793 Unspecified mononeuropathy of bilateral lower limbs: Secondary | ICD-10-CM | POA: Diagnosis not present

## 2023-09-03 DIAGNOSIS — M79672 Pain in left foot: Secondary | ICD-10-CM | POA: Diagnosis not present

## 2023-09-03 DIAGNOSIS — M25561 Pain in right knee: Secondary | ICD-10-CM

## 2023-09-03 MED ORDER — GABAPENTIN 300 MG PO CAPS: 300.0000 mg | ORAL_CAPSULE | Freq: Every day | ORAL | 0 refills | Status: DC

## 2023-09-03 NOTE — Progress Notes (Signed)
 Maria Becker - 63 y.o. female MRN [id number]  Date of birth: 07/28/1960  Office Visit Note: Visit Date: 09/03/2023 PCP: Shona [name] PEDLAR, MD Referred by: Vernetta Lonni GRADE*  Subjective: Chief Complaint  Patient presents with   Right Heel - Pain   Left Heel - Pain   HPI: Maria Becker is a pleasant 63 y.o. female who presents today for chronic bilateral foot pain.  She has a long history of chronic lower extremity and foot pain.  She has been diagnosed with plantar fasciitis in the past.  [facility] and they have given her several injections into the plantar fascia, but she states these did not improve her pain at all.  She has taken oral prednisone  and when she takes this her pain largely improved but then I will return after she completes this.  She has numbness and tingling in the feet as well as a burning and throbbing sensation over the dorsal and plantar aspects of the foot.  This is worse at nighttime and does keep her awake at night.  She has done PT, stretches, frozen water  ball and massage without significant relief.   Pertinent ROS were reviewed with the patient and found to be negative unless otherwise specified above in HPI.   Assessment & Plan: Visit Diagnoses:  1. Bilateral foot pain   2. Plantar fascia syndrome   3. Neuropathy involving both lower extremities    Plan: Impression is chronic bilateral foot pain which seems multifactorial in nature.  She has been diagnosed with plantar fasciitis in the past and she does have some symptoms more of the left foot indicative of plantar fascia syndrome, however her pain is throughout the feet and she is describing more of a burning and at times throbbing sensation throughout the foot.  Her pain is worse at night and she is describing more nerve related symptoms.  She is not diabetic, however has not been worked up for neuropathy in the past.  We did perform a trial of extracorporeal  shockwave therapy, I would like her to follow-up next week for 1 additional treatment and see what sort of relief she receives from this.  I would like to trial gabapentin  300 mg nightly, could consider increasing this to twice daily depending on how she tolerates initially.  She will continue her physical therapy as prescribed.  We will see how she responds to the above treatment.  Additional treatment/workup considerations: Laboratory evaluation for lower extremity neuropathy, consider EMG/nerve conduction study of the lower extremity  Follow-up: Return in about 1 week (around 09/10/2023) for for b/l foot pain.   Meds & Orders:  Meds ordered this encounter  Medications   gabapentin  (NEURONTIN ) 300 MG capsule    Sig: Take 1 capsule (300 mg total) by mouth at bedtime.    Dispense:  30 capsule    Refill:  0   No orders of the defined types were placed in this encounter.    Procedures: No procedures performed      Clinical History: No specialty comments available.  She reports that she has never smoked. She has never used smokeless tobacco. No results for input(s): HGBA1C, LABURIC in the last 8760 hours.  Objective:    Physical Exam  Gen: Well-appearing, in no acute distress; non-toxic CV: Well-perfused. Warm.  Resp: Breathing unlabored on room air; no wheezing. Psych: Fluid speech in conversation; appropriate affect; normal thought process  Ortho Exam - Bilateral foot:  There is some generalized tenderness throughout the foot, the left side more so near the medial band of the plantar fascia and extending underneath near the metatarsals bilaterally.  There is advanced loss of transverse arch bilaterally with widening of the forefoot.   Imaging: No results found.  Past Medical/Family/Surgical/Social History: Medications & Allergies reviewed per EMR, new medications updated. Patient Active Problem List   Diagnosis Date Noted   S/P laparoscopic supracervical hysterectomy  07/11/2023   Urge incontinence of urine 07/11/2023   Skin tag 07/11/2023   Encounter for screening fecal occult blood testing 07/11/2023   Encounter for gynecological examination with Papanicolaou smear of cervix 07/11/2023   Rectocele 07/11/2023   Status post total left knee replacement 07/22/2021   Unilateral primary osteoarthritis, left knee 06/13/2021   Dyspnea on exertion    Angina decubitus (HCC) 07/20/2015   CAD (coronary artery disease), native coronary artery, LAD on cardiac CT 07/20/2015   HTN (hypertension) 07/20/2015   Family history of premature CAD 07/20/2015   Hyperlipidemia LDL goal <70 07/20/2015   Angina at rest [facility]) 07/20/2015   Abnormal nuclear stress test    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 cath 07/2015: 30-50% mid-LAD stenosis but otherwise normal cors   Diastolic CHF, chronic (HCC)    Dyspnea    SOME SOB WITH EXERTION   Family history of adverse reaction to anesthesia    MOTHER SLOW TO WAKE UP   Fibromyalgia    GERD (gastroesophageal reflux disease)    Headache    History of kidney stones    HTN (hypertension) 07/20/2015   Hyperlipidemia    a. not currently on statin therapy   Hypothyroidism    Insomnia    Pneumonia    HX OF AS A BABY   Family History  Problem Relation Age of Onset   Diabetes Paternal Grandfather    Heart disease Paternal Grandfather    Diabetes Paternal Grandmother    Heart disease Paternal Grandmother    Diabetes Maternal Grandmother    Heart disease Maternal Grandmother    Diabetes Maternal Grandfather    Heart disease Maternal Grandfather    Melanoma Father    Diabetes Father    Macular degeneration Mother    COPD Mother    Heart attack Mother 32   Diabetes Mother    Aneurysm Mother    Diabetes Brother    Heart attack Brother 34   Kidney disease Brother    Heart disease Sister     Diabetes Sister    Macular degeneration Sister    Macular degeneration Maternal Aunt    Cancer - Colon Maternal Aunt    Kidney disease Maternal Aunt    Diabetes Maternal Aunt    Aneurysm Maternal Uncle    Heart attack Paternal Aunt    Hypothyroidism Paternal Aunt    Aneurysm Cousin    Past Surgical History:  Procedure Laterality Date   ABDOMINAL HYSTERECTOMY     BACK SURGERY     CARDIAC CATHETERIZATION N/A 07/20/2015   Procedure: Left Heart Cath and Coronary Angiography;  Surgeon: Victory LELON Sharps, MD;  Location: [facility] INVASIVE CV LAB;  Service: Cardiovascular;  Laterality: N/A;   CESAREAN SECTION     L4-L5 fusion     RIGHT/LEFT HEART CATH AND CORONARY ANGIOGRAPHY N/A 04/15/2021   Procedure: RIGHT/LEFT HEART CATH AND CORONARY ANGIOGRAPHY;  Surgeon: Verlin Lonni BIRCH, MD;  Location: [facility] INVASIVE CV LAB;  Service: Cardiovascular;  Laterality: N/A;   TOTAL KNEE ARTHROPLASTY Left 07/22/2021   Procedure: LEFT TOTAL KNEE ARTHROPLASTY;  Surgeon: Vernetta Lonni GRADE, MD;  Location: [facility];  Service: Orthopedics;  Laterality: Left;  RFNA   Social History   Occupational History   Occupation: self-employed  Tobacco Use   Smoking status: Never   Smokeless tobacco: Never  Vaping Use   Vaping status: Never Used  Substance and Sexual Activity   Alcohol use: Yes    Comment: occasional   Drug use: No   Sexual activity: Not Currently    Birth control/protection: Surgical    Comment: hyst

## 2023-09-03 NOTE — Therapy (Signed)
 OUTPATIENT PHYSICAL THERAPY LOWER EXTREMITY EVALUATION   Patient Name: Maria Becker MRN: [id number] DOB:December 25, 1960, 63 y.o., female Today's Date: 09/03/2023  END OF SESSION:  PT End of Session - 09/03/23 1624     Visit Number 1    Number of Visits 20    Date for PT Re-Evaluation 11/09/23    Authorization Type [employer]    Progress Note Due on Visit 10    PT Start Time 0759    PT Stop Time 0844    PT Time Calculation (min) 45 min    Activity Tolerance Patient tolerated treatment well;Patient limited by pain    Behavior During Therapy [facility] for tasks assessed/performed          Past Medical History:  Diagnosis Date   Arthritis    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 cath 07/2015: 30-50% mid-LAD stenosis but otherwise normal cors   Diastolic CHF, chronic (HCC)    Dyspnea    SOME SOB WITH EXERTION   Family history of adverse reaction to anesthesia    MOTHER SLOW TO WAKE UP   Fibromyalgia    GERD (gastroesophageal reflux disease)    Headache    History of kidney stones    HTN (hypertension) 07/20/2015   Hyperlipidemia    a. not currently on statin therapy   Hypothyroidism    Insomnia    Pneumonia    HX OF AS A BABY   Past Surgical History:  Procedure Laterality Date   ABDOMINAL HYSTERECTOMY     BACK SURGERY     CARDIAC CATHETERIZATION N/A 07/20/2015   Procedure: Left Heart Cath and Coronary Angiography;  Surgeon: Victory LELON Sharps, MD;  Location: [facility] INVASIVE CV LAB;  Service: Cardiovascular;  Laterality: N/A;   CESAREAN SECTION     L4-L5 fusion     RIGHT/LEFT HEART CATH AND CORONARY ANGIOGRAPHY N/A 04/15/2021   Procedure: RIGHT/LEFT HEART CATH AND CORONARY ANGIOGRAPHY;  Surgeon: Verlin Lonni BIRCH, MD;  Location: [facility] INVASIVE CV LAB;  Service: Cardiovascular;  Laterality: N/A;   TOTAL KNEE ARTHROPLASTY Left 07/22/2021   Procedure: LEFT TOTAL KNEE ARTHROPLASTY;  Surgeon: Vernetta Lonni GRADE, MD;  Location: [facility]  ORS;  Service: Orthopedics;  Laterality: Left;  RFNA   Patient Active Problem List   Diagnosis Date Noted   S/P laparoscopic supracervical hysterectomy 07/11/2023   Urge incontinence of urine 07/11/2023   Skin tag 07/11/2023   Encounter for screening fecal occult blood testing 07/11/2023   Encounter for gynecological examination with Papanicolaou smear of cervix 07/11/2023   Rectocele 07/11/2023   Status post total left knee replacement 07/22/2021   Unilateral primary osteoarthritis, left knee 06/13/2021   Dyspnea on exertion    Angina decubitus (HCC) 07/20/2015   CAD (coronary artery disease), native coronary artery, LAD on cardiac CT 07/20/2015   HTN (hypertension) 07/20/2015   Family history of premature CAD 07/20/2015   Hyperlipidemia LDL goal <70 07/20/2015   Angina at rest [facility]) 07/20/2015   Abnormal nuclear stress test    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 See imaging below for MRI results. Podiatrist also diagnosed her with bilateral plantar fasciitis.  She saw PA on 08/20/2023 with PT ordered and recommended shock wave therapy with Dr. Burnetta.   She had laceration to left great toe. She has been doing old knee exercises both in pool and NWB out of water  (limited by plantar fasciitis pain).  She has numbness & lack of sensation in both feet since knee surgery 2023.    PERTINENT HISTORY: Left TKA 07/22/21, OA, CAD, CHF, dyspnea, fibromyalgia,  HTN, HLD, Hypothyroidism, back sg L4-5 fusion  DIAGNOSTIC FINDINGS: 08/03/23 right knee MR impression Tricompartmental osteoarthrosis most notably of the lateral and lateral patellofemoral compartment. There is a moderate-sized reactive joint effusion and moderate-sized leaking Baker's cyst.   Nondisplaced horizontal tear of the anterior horn and anterior body of the lateral meniscus.  PAIN:  Are you having pain? Yes: NPRS scale: at rest during day 1-2/10 at night 6/10,  standing and walking 9-10/10   Pain location: right knee posteriorly & around knee cap Pain description:  at rest ache,  standing /walking back feels like being cut tearing and medial distal patella sharp Aggravating factors: sleeping, standing & walking Relieving factors: 2 Tylenol  or Aleve  Voltaren  cream  Feet 5-8/10  Relieving factors elevation Aggravating factors: standing & walking  PRECAUTIONS: None  RED FLAGS: None   WEIGHT BEARING RESTRICTIONS: No  FALLS:  Has patient fallen in last 6 months? 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 0 1 2 3 4 5 6 7 8 9  10 (Date and Score)  Activity Eval     1.  Walking no pain 5     2. Standing no pain  2    3.  Exercising no pain 6   4.    5.    Score 4.33    Total score = sum of the activity scores/number of activities Minimum detectable change (90%CI) for  average score = 2 points Minimum detectable change (90%CI) for single activity score = 3 points  COGNITION: Overall cognitive status: WFL    SENSATION: WFL for right knee and light touch Bil. Feet except left dorsum medial foot to great toe.   She reports unable to feel standing in cold water  or when she cut her left great toe.    EDEMA:  Localized edema to right knee.    MUSCLE LENGTH: Hamstrings: Tightness noted bilaterally  POSTURE: rounded shoulders, forward head, and weight shift left  PALPATION: Right knee tenderness to palpation popliteal area, hamstrings especially along the tendons at the insertion and patella tendon/medial joint line. Feet tenderness under bilateral heels and bilateral plantar fascia.  LOWER EXTREMITY ROM:   ROM Right eval Left eval  Hip flexion    Hip extension  Hip abduction    Hip adduction    Hip internal rotation    Hip external rotation    Knee flexion Seated  A: 119*   Knee extension Seated  LAQ -2*   Ankle dorsiflexion    Ankle plantarflexion    Ankle inversion    Ankle eversion     (Blank rows = not tested)  LOWER EXTREMITY MMT:  MMT Right eval Left eval  Hip flexion    Hip extension    Hip abduction    Hip adduction    Hip internal rotation    Hip external rotation    Knee flexion 4-/5   Knee extension 4/5   Ankle dorsiflexion    Ankle plantarflexion    Ankle inversion    Ankle eversion     (Blank rows = not tested)   FUNCTIONAL TESTS:    GAIT: Distance walked: 200' Assistive device utilized: None Level of assistance: Modified independence /prior send cues for gait deviations but no instability or safety issues noted Comments: Antalgic gait pattern with decreased stance RLE, decreased weight shift in terminal stance LLE but may be related to recent laceration to great toe.    TODAY'S TREATMENT                                                                          DATE: 09/03/2023: Therapeutic  Exercise: HEP instruction/performance c cues for techniques, handout provided.  Trial set performed of each for comprehension and symptom assessment.  See below for exercise list  Self-care: PT demo and verbal cues on positioning in side-lying for upper lower extremity support with minimal to no hip rotation and using pillows to tent sheets off of her feet.  Patient verbalized understanding.  PATIENT EDUCATION:  Education details: HEP, POC Person educated: Patient Education method: Programmer, multimedia, Demonstration, Verbal cues, and Handouts Education comprehension: verbalized understanding, returned demonstration, and verbal cues required  HOME EXERCISE PROGRAM: Access Code: [email or web address] URL: [email or web address] Date: 09/03/2023 Prepared by: Grayce Spatz  Exercises - Clamshell  - 1 x daily - 5-7 x weekly - 1-2 sets - 10 reps - 5 seconds hold - Sidelying Reverse Clamshell  - 1 x daily - 5-7 x weekly - 1-2 sets - 10 reps - 5 seconds hold - Sidelying Bent Knee Lift at 45 Degrees  - 1 x daily - 5-7 x weekly - 1-2 sets - 10 reps - 5 seconds hold - Supine Bridge  - 1 x daily - 5-7 x weekly - 1-2 sets - 10 reps - 5 seconds hold - Supine Piriformis Stretch with Foot on Ground  - 1-2 x daily - 7 x weekly - 1 sets - 3 reps - 30 seconds hold  ASSESSMENT: CLINICAL IMPRESSION: Patient is a 63 y.o. who comes to clinic with complaints of right knee pain with mobility, strength and movement coordination deficits that impair their ability to perform usual daily and recreational functional activities without increase difficulty/symptoms at this time.  Patient to benefit from skilled PT services to address impairments and limitations to improve to previous level of function without restriction secondary to condition.   OBJECTIVE IMPAIRMENTS: Abnormal gait, decreased activity tolerance, decreased balance, decreased knowledge of condition, decreased mobility, difficulty walking, decreased  ROM,  decreased strength, increased edema, postural dysfunction, and pain.   ACTIVITY LIMITATIONS: carrying, lifting, bending, standing, squatting, sleeping, stairs, transfers, and locomotion level  PARTICIPATION LIMITATIONS: meal prep, community activity, and occupation  PERSONAL FACTORS: Fitness, Past/current experiences, Time since onset of injury/illness/exacerbation, and 3+ comorbidities: see PMH are also affecting patient's functional outcome.   REHAB POTENTIAL: Good  CLINICAL DECISION MAKING: Stable/uncomplicated  EVALUATION COMPLEXITY: Low   GOALS: Goals reviewed with patient? Yes  SHORT TERM GOALS: (target date for Short term goals 10/01/2023)   1.  Patient will demonstrate independent use of home exercise program to maintain progress from in clinic treatments. Baseline: See objective data Goal status: Initial  2.  Patient reports 50% improvement in right knee pain with activities. Baseline: See objective data Goal status: Initial  LONG TERM GOALS: (target dates for all long term goals  11/09/2023 )   1. Patient will demonstrate/report pain at worst less than or equal to 2/10 to facilitate minimal limitation in daily activity secondary to pain symptoms. Baseline: See objective data Goal status: Initial   2. Patient will demonstrate independent use of home exercise program to facilitate ability to maintain/progress functional gains from skilled physical therapy services. Baseline: See objective data Goal status: Initial  3.  Patient reports Patient-Specific Activity Score improved the average >7 to indicate improvement in functional activities.  Baseline: SEE OBJECTIVE DATA Goal status: INITIAL   4.  Patient will demonstrate right knee LE MMT 5/5 throughout to faciltiate usual transfers, stairs, squatting at [facility] for daily life.  Baseline: See objective data Goal status: Initial   5.  Patient patient ambulates greater than 500 feet, negotiate ramp curb and stairs with  single rail modified independent with knee pain not increasing greater than 2 increments.  Baseline: See objective data Goal status: Initial  PLAN:  PT FREQUENCY:  1-2x/week  PT DURATION: 10 weeks  PLANNED INTERVENTIONS: [id number]- PT Re-evaluation, 97750- Physical Performance Testing, 97110-Therapeutic exercises, 97530- Therapeutic activity, [id number]- Neuromuscular re-education, 97535- Self Care, [id number]- Manual therapy, [id number]- Gait training, [phone]- Orthotic Initial, [id number]- Orthotic/Prosthetic subsequent, H9716- Electrical stimulation (unattended), [id number]- Electrical stimulation (manual), [id number]- Vasopneumatic device, [id number]- Ionotophoresis 4mg /ml Dexamethasone, 79439 (1-2 muscles), 20561 (3+ muscles)- Dry Needling, Patient/Family education, Balance training, Stair training, Taping, Joint mobilization, Cryotherapy, and Moist heat  PLAN FOR NEXT SESSION: Review & Update HEP.    Patient would benefit from assessment to rule out bilateral lower extremity neuropathy.     Grayce Spatz, PT, DPT 09/03/2023, 4:39 PM

## 2023-09-03 NOTE — Progress Notes (Signed)
 Patient says that she has had plantar fasciitis in both feet that flared up April 2024. She says that she has been seen at [facility] where they have given her oral Prednisone  several times, as well as several injections into the heels. She says that the only treatment that has ever given her relief is the oral Prednisone , but her pain does return. She has had numbness of the entire foot, as well as burning in the heel and through the arch of her foot. She says that it wakes her up at night, and she will have to massage through the arches of her feet. She has tried a frozen water  bottle, as well as stretches at home. She is also in physical therapy for her knee pain, where they are giving her exercises and treatment for the calves as well.

## 2023-09-07 DIAGNOSIS — E785 Hyperlipidemia, unspecified: Secondary | ICD-10-CM | POA: Diagnosis not present

## 2023-09-07 DIAGNOSIS — E039 Hypothyroidism, unspecified: Secondary | ICD-10-CM | POA: Diagnosis not present

## 2023-09-07 DIAGNOSIS — M8580 Other specified disorders of bone density and structure, unspecified site: Secondary | ICD-10-CM | POA: Diagnosis not present

## 2023-09-07 DIAGNOSIS — R109 Unspecified abdominal pain: Secondary | ICD-10-CM | POA: Diagnosis not present

## 2023-09-07 DIAGNOSIS — R7303 Prediabetes: Secondary | ICD-10-CM | POA: Diagnosis not present

## 2023-09-11 NOTE — Therapy (Incomplete)
 OUTPATIENT PHYSICAL THERAPY LOWER EXTREMITY TREATMENT   Patient Name: Maria Becker MRN: [id number] DOB:12-03-1960, 63 y.o., female Today's Date: 09/12/2023  END OF SESSION:  PT End of Session - 09/12/23 0813     Visit Number 2    Number of Visits 20    Date for PT Re-Evaluation 11/09/23    Authorization Type [employer]    Progress Note Due on Visit 10    PT Start Time 0810    PT Stop Time 0848    PT Time Calculation (min) 38 min    Activity Tolerance Patient tolerated treatment well;Patient limited by pain    Behavior During Therapy [facility] for tasks assessed/performed           Past Medical History:  Diagnosis Date   Arthritis    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 cath 07/2015: 30-50% mid-LAD stenosis but otherwise normal cors   Diastolic CHF, chronic (HCC)    Dyspnea    SOME SOB WITH EXERTION   Family history of adverse reaction to anesthesia    MOTHER SLOW TO WAKE UP   Fibromyalgia    GERD (gastroesophageal reflux disease)    Headache    History of kidney stones    HTN (hypertension) 07/20/2015   Hyperlipidemia    a. not currently on statin therapy   Hypothyroidism    Insomnia    Pneumonia    HX OF AS A BABY   Past Surgical History:  Procedure Laterality Date   ABDOMINAL HYSTERECTOMY     BACK SURGERY     CARDIAC CATHETERIZATION N/A 07/20/2015   Procedure: Left Heart Cath and Coronary Angiography;  Surgeon: Victory LELON Sharps, MD;  Location: [facility] INVASIVE CV LAB;  Service: Cardiovascular;  Laterality: N/A;   CESAREAN SECTION     L4-L5 fusion     RIGHT/LEFT HEART CATH AND CORONARY ANGIOGRAPHY N/A 04/15/2021   Procedure: RIGHT/LEFT HEART CATH AND CORONARY ANGIOGRAPHY;  Surgeon: Verlin Lonni BIRCH, MD;  Location: [facility] INVASIVE CV LAB;  Service: Cardiovascular;  Laterality: N/A;   TOTAL KNEE ARTHROPLASTY Left 07/22/2021   Procedure: LEFT TOTAL KNEE ARTHROPLASTY;  Surgeon: Vernetta Lonni GRADE, MD;  Location: [facility]  ORS;  Service: Orthopedics;  Laterality: Left;  RFNA   Patient Active Problem List   Diagnosis Date Noted   S/P laparoscopic supracervical hysterectomy 07/11/2023   Urge incontinence of urine 07/11/2023   Skin tag 07/11/2023   Encounter for screening fecal occult blood testing 07/11/2023   Encounter for gynecological examination with Papanicolaou smear of cervix 07/11/2023   Rectocele 07/11/2023   Status post total left knee replacement 07/22/2021   Unilateral primary osteoarthritis, left knee 06/13/2021   Dyspnea on exertion    Angina decubitus (HCC) 07/20/2015   CAD (coronary artery disease), native coronary artery, LAD on cardiac CT 07/20/2015   HTN (hypertension) 07/20/2015   Family history of premature CAD 07/20/2015   Hyperlipidemia LDL goal <70 07/20/2015   Angina at rest [facility]) 07/20/2015   Abnormal nuclear stress test    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 She has been taking it for 7 nights and has had minimal improvement by being able to rest more, but still has pain, itch, burn, and shock sensation. Her pain initially started riding a bike barefoot for 30 minutes everyday. Patient has pain when initially getting up. She feels better when she's in the water  and overall feels that the hip is doing better.   PERTINENT HISTORY: Left TKA 07/22/21, OA, CAD, CHF, dyspnea, fibromyalgia, HTN, HLD, Hypothyroidism, back sg L4-5 fusion  DIAGNOSTIC FINDINGS: 08/03/23 right knee MR impression  Tricompartmental osteoarthrosis most notably of the lateral and lateral patellofemoral compartment. There is a moderate-sized reactive joint effusion and moderate-sized leaking Baker's cyst.   Nondisplaced horizontal tear of the anterior horn and anterior body of the lateral meniscus.  PAIN:  Are you having pain? Yes: NPRS scale: at rest during day 1-2/10 at night 6/10,  standing and walking 9-10/10   Pain location: right knee posteriorly & around knee cap Pain description:  at rest ache,  standing /walking back feels like being cut tearing and medial distal patella sharp Aggravating factors: sleeping, standing & walking Relieving factors: 2 Tylenol  or Aleve  Voltaren  cream  Feet 5-8/10  Relieving factors elevation Aggravating factors: standing & walking  PRECAUTIONS: None  RED FLAGS: None   WEIGHT BEARING RESTRICTIONS: No  FALLS:  Has patient fallen in last 6 months? 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 0 1 2 3 4 5 6 7 8 9  10 (Date and Score)  Activity Eval     1.  Walking no pain 5     2. Standing no pain  2    3.  Exercising no pain 6   4.    5.    Score 4.33    Total score = sum of the activity scores/number of activities Minimum detectable change (90%CI) for average score = 2 points Minimum detectable change (90%CI) for single activity score = 3  points  COGNITION: Overall cognitive status: WFL    SENSATION: WFL for right knee and light touch Bil. Feet except left dorsum medial foot to great toe.   She reports unable to feel standing in cold water  or when she cut her left great toe.    EDEMA:  Localized edema to right knee.    MUSCLE LENGTH: Hamstrings: Tightness noted bilaterally  POSTURE: rounded shoulders, forward head, and weight shift left  PALPATION: Right knee tenderness to palpation popliteal area, hamstrings especially along the tendons at the insertion and patella tendon/medial joint line. Feet tenderness under bilateral heels and bilateral plantar fascia.  LOWER EXTREMITY ROM:   ROM Right eval Left eval  Hip flexion    Hip extension    Hip abduction    Hip adduction    Hip internal rotation    Hip  external rotation    Knee flexion Seated  A: 119*   Knee extension Seated  LAQ -2*   Ankle dorsiflexion    Ankle plantarflexion    Ankle inversion    Ankle eversion     (Blank rows = not tested)  LOWER EXTREMITY MMT:  MMT Right eval Left eval  Hip flexion    Hip extension    Hip abduction    Hip adduction    Hip internal rotation    Hip external rotation    Knee flexion 4-/5   Knee extension 4/5   Ankle dorsiflexion    Ankle plantarflexion    Ankle inversion    Ankle eversion     (Blank rows = not tested)   FUNCTIONAL TESTS:    GAIT: Distance walked: 200' Assistive device utilized: None Level of assistance: Modified independence /prior send cues for gait deviations but no instability or safety issues noted Comments: Antalgic gait pattern with decreased stance RLE, decreased weight shift in terminal stance LLE but may be related to recent laceration to great toe.    TODAY'S TREATMENT                                                                          DATE: 09/12/2023: Therapeutic Exercise:  Recumbent Bike seat 5 BLEs only, level 3 for 10 minutes Educated verbally about building  up to 30 minutes on the bike by doing sets of 10 minutes and decreasing rest period from 5 minutes to no rest. Educated about seat position, full contact with foot pedals without toes pointing down. Patient verbalized understanding.  Gastrocnemius stretch on step. 4 inch step with heel hanging off edge in DF position. 2x30 seconds, each leg. Review of HEP, reverse clamshell and clamshell. Provided verbal education and physical cues to avoid hip rotation. 1x10 each Review of HEP, side-lying hip abduction. Provided verbal education and physical cues to avoid toes from pointing up during contraction. 1x10 Review of HEP, glute bridge. 1x10. Provided verbal education and physical cues to keep knees and shoulders in line.  Progressed to [facility] bridge with green resistance band, focus on keeping tension on the band. 1x10 Review of HEP, piriformis stretch. 3x 30 seconds each leg. Provided verbal education and physical cues to cross leg over knee during exercise. Patient verbalized and demonstrated understanding.   Self-Care Educated about usage of Thera Cane for gluteal area, shoulder, or foot to self-massage. Patient verbalized  understanding.    TREATMENT                                                                          DATE: 09/03/2023: Therapeutic Exercise: HEP instruction/performance c cues for techniques, handout provided.  Trial set performed of each for comprehension and symptom assessment.  See below for exercise list  Self-care: PT demo and verbal cues on positioning in side-lying for upper lower extremity support with minimal to no hip rotation and using pillows to tent  sheets off of her feet.  Patient verbalized understanding.  PATIENT EDUCATION:  Education details: HEP, POC Person educated: Patient Education method: Programmer, multimedia, Demonstration, Verbal cues, and Handouts Education comprehension: verbalized understanding, returned demonstration, and verbal cues required  HOME  EXERCISE PROGRAM: Access Code: [email or web address] URL: [email or web address] Date: 09/12/2023 Prepared by: Grayce Spatz  Exercises - Clamshell  - 1 x daily - 5-7 x weekly - 1-2 sets - 10 reps - 5 seconds hold - Sidelying Reverse Clamshell  - 1 x daily - 5-7 x weekly - 1-2 sets - 10 reps - 5 seconds hold - Sidelying Hip Abduction  - 1 x daily - 5-7 x weekly - 1-2 sets - 10 reps - 5 seconds hold - Supine Piriformis Stretch with Foot on Ground  - 1-2 x daily - 7 x weekly - 1 sets - 3 reps - 30 seconds hold - Supine Bridge with Resistance Band  - 1-2 x daily - 7 x weekly - 1-2 sets - 10 reps - 5 seconds hold - Gastroc Stretch on Step  - 1-2 x daily - 7 x weekly - 1 sets - 3 reps - 30 seconds hold  ASSESSMENT: CLINICAL IMPRESSION: Patient overall did well. Review and modification of HEP will continue to progress hip strengthening and stretches to achieve goals. Patient to benefit from continued skill PT to address impairments and limitations.   OBJECTIVE IMPAIRMENTS: Abnormal gait, decreased activity tolerance, decreased balance, decreased knowledge of condition, decreased mobility, difficulty walking, decreased ROM, decreased strength, increased edema, postural dysfunction, and pain.   ACTIVITY LIMITATIONS: carrying, lifting, bending, standing, squatting, sleeping, stairs, transfers, and locomotion level  PARTICIPATION LIMITATIONS: meal prep, community activity, and occupation  PERSONAL FACTORS: Fitness, Past/current experiences, Time since onset of injury/illness/exacerbation, and 3+ comorbidities: see PMH are also affecting patient's functional outcome.   REHAB POTENTIAL: Good  CLINICAL DECISION MAKING: Stable/uncomplicated  EVALUATION COMPLEXITY: Low   GOALS: Goals reviewed with patient? Yes  SHORT TERM GOALS: (target date for Short term goals 10/01/2023)   1.  Patient will demonstrate independent use of home exercise program to maintain progress from in clinic  treatments. Baseline: See objective data Goal status: Ongoing   09/12/2023  2.  Patient reports 50% improvement in right knee pain with activities. Baseline: See objective data Goal status: Ongoing   09/12/2023  LONG TERM GOALS: (target dates for all long term goals  11/09/2023 )   1. Patient will demonstrate/report pain at worst less than or equal to 2/10 to facilitate minimal limitation in daily activity secondary to pain symptoms. Baseline: See objective data Goal status: Ongoing   09/12/2023   2. Patient will demonstrate independent use of home exercise program to facilitate ability to maintain/progress functional gains from skilled physical therapy services. Baseline: See objective data Goal status: Ongoing   09/12/2023  3.  Patient reports Patient-Specific Activity Score improved the average >7 to indicate improvement in functional activities.  Baseline: SEE OBJECTIVE DATA Goal status: Ongoing   09/12/2023   4.  Patient will demonstrate right knee LE MMT 5/5 throughout to faciltiate usual transfers, stairs, squatting at [facility] for daily life.  Baseline: See objective data Goal status: Ongoing   09/12/2023   5.  Patient patient ambulates greater than 500 feet, negotiate ramp curb and stairs with single rail modified independent with knee pain not increasing greater than 2 increments.  Baseline: See objective data Goal status: Ongoing   09/12/2023  PLAN:  PT FREQUENCY:  1-2x/week  PT DURATION: 10 weeks  PLANNED  INTERVENTIONS: 97164- PT Re-evaluation, 97750- Physical Performance Testing, 97110-Therapeutic exercises, 97530- Therapeutic activity, [id number]- Neuromuscular re-education, 97535- Self Care, [id number]- Manual therapy, [id number]- Gait training, [phone]- Orthotic Initial, [phone]- Orthotic/Prosthetic subsequent, H9716- Electrical stimulation (unattended), [phone]- Electrical stimulation (manual), [id number]- Vasopneumatic device, [id number]- Ionotophoresis 4mg /ml Dexamethasone, 20560 (1-2 muscles), 20561 (3+  muscles)- Dry Needling, Patient/Family education, Balance training, Stair training, Taping, Joint mobilization, Cryotherapy, and Moist heat  PLAN FOR NEXT SESSION: Review HEP, introduce new strengthening and stretching exercises, and work towards STGs ans LTGs.     Patient would benefit from assessment to rule out bilateral lower extremity neuropathy.     Ismael Nap, Student-PT, DPT 09/12/2023, 9:24 AM  This entire session of physical therapy was performed under the direct supervision of PT signing evaluation /treatment. PT reviewed note and agrees.   Grayce Spatz, PT, DPT 09/12/2023, 12:49 PM

## 2023-09-12 ENCOUNTER — Ambulatory Visit: Admitting: Sports Medicine

## 2023-09-12 ENCOUNTER — Encounter: Payer: Self-pay | Admitting: Physical Therapy

## 2023-09-12 ENCOUNTER — Ambulatory Visit: Admitting: Physical Therapy

## 2023-09-12 ENCOUNTER — Encounter: Payer: Self-pay | Admitting: Sports Medicine

## 2023-09-12 DIAGNOSIS — M79671 Pain in right foot: Secondary | ICD-10-CM

## 2023-09-12 DIAGNOSIS — R2689 Other abnormalities of gait and mobility: Secondary | ICD-10-CM | POA: Diagnosis not present

## 2023-09-12 DIAGNOSIS — M6281 Muscle weakness (generalized): Secondary | ICD-10-CM

## 2023-09-12 DIAGNOSIS — M25561 Pain in right knee: Secondary | ICD-10-CM | POA: Diagnosis not present

## 2023-09-12 DIAGNOSIS — M722 Plantar fascial fibromatosis: Secondary | ICD-10-CM

## 2023-09-12 DIAGNOSIS — M79672 Pain in left foot: Secondary | ICD-10-CM | POA: Diagnosis not present

## 2023-09-12 DIAGNOSIS — G8929 Other chronic pain: Secondary | ICD-10-CM

## 2023-09-12 DIAGNOSIS — G5793 Unspecified mononeuropathy of bilateral lower limbs: Secondary | ICD-10-CM | POA: Diagnosis not present

## 2023-09-12 NOTE — Progress Notes (Signed)
 Maria Becker - 63 y.o. female MRN [id number]  Date of birth: 1960-10-14  Office Visit Note: Visit Date: 09/12/2023 PCP: Shona [name] PEDLAR, MD Referred by: Shona [name] PEDLAR, MD  Subjective: Chief Complaint  Patient presents with   Right Foot - Pain    Right > left foot pain. She is here for shockwave therapy on both feet. She feels that the gabapentin  is helping her to sleep and helping at night.  She has history of neuropathy.   Left Foot - Pain   HPI: Maria Becker is a pleasant 63 y.o. female who presents today for chronic bilateral foot pain.  Still having bilateral foot pain, although she does note that the gabapentin  is certainly helping her symptoms, specifically at night.  She was actually able to sleep through the night which she has not been able to do in quite a while.  Tolerated shockwave treatment well, unsure the exact benefit.  She is using gabapentin  300 mg nightly.  Feels like her swelling in her feet has also improved.  Did have 1 session of formalized physical therapy since last visit, doing these at home as well. She has been getting into the pool and performing exercises/physical activity almost every day--finding this beneficial.  She is not diabetic.  She notes she has a strong family history of neuropathy.  Pertinent ROS were reviewed with the patient and found to be negative unless otherwise specified above in HPI.   Assessment & Plan: Visit Diagnoses:  1. Bilateral foot pain   2. Neuropathy involving both lower extremities   3. Plantar fascia syndrome    Plan: Impression is chronic bilateral foot pain which is multifactorial in nature.  She did have a positive response to gabapentin  300 mg nightly and did tolerate extracorporeal shockwave therapy well.  Likely both of these helped, but I believe more of her neuropathy symptoms are driving her pain.  She does have a degree of plantar fascia syndrome as well as loss of transverse arch bilaterally which permeates  pain throughout both feet.  At this point, I would like her to increase her gabapentin  from once daily to 300 mg twice daily.  We did repeat extracorporeal shockwave therapy for the plantar fascia and arch of the feet bilaterally today.  I would like to see her back in about 2 weeks and reevaluate.  At that point, we may consider a more thorough workup for her underlying undiagnosed neuropathy, as she is not diabetic.  Additional treatment considerations: EMG/nerve conduction study, green sports insoles with metatarsal padding (although previous orthotics caused her pain)  Follow-up: Return in about 2 weeks (around 09/26/2023) for FU b/l feet, ?blood draw, neuropathy w/u.   Meds & Orders: No orders of the defined types were placed in this encounter.  No orders of the defined types were placed in this encounter.    Procedures: Procedure: ECSWT Indications:  Plantar fascia syndrome   Procedure Details Consent: Risks of procedure as well as the alternatives and risks of each were explained to the patient.  Verbal consent for procedure obtained. Time Out: Verified patient identification, verified procedure, site was marked, verified correct patient position. The area was cleaned with alcohol swab.     The Right and Left plantar fascia and underlying foot arch was targeted for Extracorporeal shockwave therapy.    Preset: Plantar fasciitis - Right Power Level: 100 mJ Frequency: 11 Hz Impulse/cycles: 2000 Head size: Regular   Preset: Plantar fasciitis - Left Power Level: 100  mJ Frequency: 11 Hz Impulse/cycles: 2000 Head size: Regular  Patient tolerated procedure well without immediate complications.       Clinical History: No specialty comments available.  She reports that she has never smoked. She has never used smokeless tobacco. No results for input(s): HGBA1C, LABURIC in the last 8760 hours.  Objective:    Physical Exam  Gen: Well-appearing, in no acute distress;  non-toxic CV: Well-perfused. Warm.  Resp: Breathing unlabored on room air; no wheezing. Psych: Fluid speech in conversation; appropriate affect; normal thought process  Ortho Exam - Bilateral feet: There is generalized tenderness throughout the arch of the foot and near the medial band of the plantar fascia left > right.  There is widening of the forefoot with loss of transverse arch bilaterally.  Fairly well-preserved longitudinal arch of each foot.  Imaging: No results found.  Past Medical/Family/Surgical/Social History: Medications & Allergies reviewed per EMR, new medications updated. Patient Active Problem List   Diagnosis Date Noted   S/P laparoscopic supracervical hysterectomy 07/11/2023   Urge incontinence of urine 07/11/2023   Skin tag 07/11/2023   Encounter for screening fecal occult blood testing 07/11/2023   Encounter for gynecological examination with Papanicolaou smear of cervix 07/11/2023   Rectocele 07/11/2023   Status post total left knee replacement 07/22/2021   Unilateral primary osteoarthritis, left knee 06/13/2021   Dyspnea on exertion    Angina decubitus (HCC) 07/20/2015   CAD (coronary artery disease), native coronary artery, LAD on cardiac CT 07/20/2015   HTN (hypertension) 07/20/2015   Family history of premature CAD 07/20/2015   Hyperlipidemia LDL goal <70 07/20/2015   Angina at rest [facility]) 07/20/2015   Abnormal nuclear stress test    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 cath 07/2015: 30-50% mid-LAD stenosis but otherwise normal cors   Diastolic CHF, chronic (HCC)    Dyspnea    SOME SOB WITH EXERTION   Family history of adverse reaction to anesthesia    MOTHER SLOW TO WAKE UP   Fibromyalgia    GERD (gastroesophageal reflux disease)    Headache    History of kidney stones    HTN (hypertension) 07/20/2015   Hyperlipidemia     a. not currently on statin therapy   Hypothyroidism    Insomnia    Pneumonia    HX OF AS A BABY   Family History  Problem Relation Age of Onset   Diabetes Paternal Grandfather    Heart disease Paternal Grandfather    Diabetes Paternal Grandmother    Heart disease Paternal Grandmother    Diabetes Maternal Grandmother    Heart disease Maternal Grandmother    Diabetes Maternal Grandfather    Heart disease Maternal Grandfather    Melanoma Father    Diabetes Father    Macular degeneration Mother    COPD Mother    Heart attack Mother 73   Diabetes Mother    Aneurysm Mother    Diabetes Brother    Heart attack Brother 10   Kidney disease Brother    Heart disease Sister    Diabetes Sister    Macular degeneration Sister    Macular degeneration Maternal Aunt    Cancer - Colon Maternal Aunt    Kidney disease Maternal Aunt    Diabetes Maternal Aunt    Aneurysm Maternal Uncle    Heart attack Paternal Aunt  Hypothyroidism Paternal Aunt    Aneurysm Cousin    Past Surgical History:  Procedure Laterality Date   ABDOMINAL HYSTERECTOMY     BACK SURGERY     CARDIAC CATHETERIZATION N/A 07/20/2015   Procedure: Left Heart Cath and Coronary Angiography;  Surgeon: Victory LELON Sharps, MD;  Location: [place] INVASIVE CV LAB;  Service: Cardiovascular;  Laterality: N/A;   CESAREAN SECTION     L4-L5 fusion     RIGHT/LEFT HEART CATH AND CORONARY ANGIOGRAPHY N/A 04/15/2021   Procedure: RIGHT/LEFT HEART CATH AND CORONARY ANGIOGRAPHY;  Surgeon: Verlin Lonni BIRCH, MD;  Location: [facility] INVASIVE CV LAB;  Service: Cardiovascular;  Laterality: N/A;   TOTAL KNEE ARTHROPLASTY Left 07/22/2021   Procedure: LEFT TOTAL KNEE ARTHROPLASTY;  Surgeon: Vernetta Lonni GRADE, MD;  Location: [facility] ORS;  Service: Orthopedics;  Laterality: Left;  RFNA   Social History   Occupational History   Occupation: self-employed  Tobacco Use   Smoking status: Never   Smokeless tobacco: Never  Vaping Use   Vaping status: Never  Used  Substance and Sexual Activity   Alcohol use: Yes    Comment: occasional   Drug use: No   Sexual activity: Not Currently    Birth control/protection: Surgical    Comment: hyst

## 2023-09-17 ENCOUNTER — Ambulatory Visit: Admitting: Physical Therapy

## 2023-09-17 DIAGNOSIS — M6281 Muscle weakness (generalized): Secondary | ICD-10-CM | POA: Diagnosis not present

## 2023-09-17 DIAGNOSIS — M79672 Pain in left foot: Secondary | ICD-10-CM

## 2023-09-17 DIAGNOSIS — M25561 Pain in right knee: Secondary | ICD-10-CM

## 2023-09-17 DIAGNOSIS — R2689 Other abnormalities of gait and mobility: Secondary | ICD-10-CM | POA: Diagnosis not present

## 2023-09-17 DIAGNOSIS — G8929 Other chronic pain: Secondary | ICD-10-CM

## 2023-09-17 DIAGNOSIS — M79671 Pain in right foot: Secondary | ICD-10-CM

## 2023-09-17 NOTE — Therapy (Addendum)
 OUTPATIENT PHYSICAL THERAPY LOWER EXTREMITY TREATMENT   Patient Name: Maria Becker MRN: [id number] DOB:08/12/1960, 63 y.o., female Today's Date: 09/17/2023  END OF SESSION:  PT End of Session - 09/17/23 0803     Visit Number 3    Number of Visits 20    Date for PT Re-Evaluation 11/09/23    Authorization Type [employer]    Progress Note Due on Visit 10    PT Start Time 0800    PT Stop Time 0847    PT Time Calculation (min) 47 min    Activity Tolerance Patient tolerated treatment well;Patient limited by pain    Behavior During Therapy [facility] for tasks assessed/performed            Past Medical History:  Diagnosis Date   Arthritis    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 cath 07/2015: 30-50% mid-LAD stenosis but otherwise normal cors   Diastolic CHF, chronic (HCC)    Dyspnea    SOME SOB WITH EXERTION   Family history of adverse reaction to anesthesia    MOTHER SLOW TO WAKE UP   Fibromyalgia    GERD (gastroesophageal reflux disease)    Headache    History of kidney stones    HTN (hypertension) 07/20/2015   Hyperlipidemia    a. not currently on statin therapy   Hypothyroidism    Insomnia    Pneumonia    HX OF AS A BABY   Past Surgical History:  Procedure Laterality Date   ABDOMINAL HYSTERECTOMY     BACK SURGERY     CARDIAC CATHETERIZATION N/A 07/20/2015   Procedure: Left Heart Cath and Coronary Angiography;  Surgeon: Victory LELON Sharps, MD;  Location: [facility] INVASIVE CV LAB;  Service: Cardiovascular;  Laterality: N/A;   CESAREAN SECTION     L4-L5 fusion     RIGHT/LEFT HEART CATH AND CORONARY ANGIOGRAPHY N/A 04/15/2021   Procedure: RIGHT/LEFT HEART CATH AND CORONARY ANGIOGRAPHY;  Surgeon: Verlin Lonni BIRCH, MD;  Location: [facility] INVASIVE CV LAB;  Service: Cardiovascular;  Laterality: N/A;   TOTAL KNEE ARTHROPLASTY Left 07/22/2021   Procedure: LEFT TOTAL KNEE ARTHROPLASTY;  Surgeon: Vernetta Lonni GRADE, MD;  Location:  [facility] ORS;  Service: Orthopedics;  Laterality: Left;  RFNA   Patient Active Problem List   Diagnosis Date Noted   S/P laparoscopic supracervical hysterectomy 07/11/2023   Urge incontinence of urine 07/11/2023   Skin tag 07/11/2023   Encounter for screening fecal occult blood testing 07/11/2023   Encounter for gynecological examination with Papanicolaou smear of cervix 07/11/2023   Rectocele 07/11/2023   Status post total left knee replacement 07/22/2021   Unilateral primary osteoarthritis, left knee 06/13/2021   Dyspnea on exertion    Angina decubitus (HCC) 07/20/2015   CAD (coronary artery disease), native coronary artery, LAD on cardiac CT 07/20/2015   HTN (hypertension) 07/20/2015   Family history of premature CAD 07/20/2015   Hyperlipidemia LDL goal <70 07/20/2015   Angina at rest [facility]) 07/20/2015   Abnormal nuclear stress test    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 Medication helps but pt still has pain when initially standing up, in the morning and after 4pm. She reports wearing shoes soon after waking up and doing morning routine. Additionally, pt uses orthopedic flip flops when getting out of the water  to avoid tightness and needle sensation that usually occurs.   PERTINENT HISTORY: Left TKA 07/22/21, OA, CAD, CHF, dyspnea, fibromyalgia, HTN, HLD, Hypothyroidism, back sg L4-5 fusion  DIAGNOSTIC FINDINGS: 08/03/23 right knee MR impression  Tricompartmental osteoarthrosis most notably of the lateral and lateral patellofemoral compartment. There is a moderate-sized reactive joint effusion and moderate-sized leaking Baker's cyst.   Nondisplaced horizontal tear of the anterior horn and anterior body of the lateral meniscus.  PAIN:  Are you having pain? Yes: NPRS scale: at rest during day 1-2/10 at night 6/10,  standing and walking 9-10/10   Pain location: right knee posteriorly & around knee cap Pain description:  at rest ache,  standing /walking back feels like being cut tearing and medial distal patella sharp Aggravating factors: sleeping, standing & walking Relieving factors: 2 Tylenol  or Aleve  Voltaren  cream  Feet 5-8/10  Relieving factors elevation Aggravating factors: standing & walking  PRECAUTIONS: None  RED FLAGS: None   WEIGHT BEARING RESTRICTIONS: No  FALLS:  Has patient fallen in last 6 months? 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 0 1 2 3 4 5 6 7 8 9  10 (Date and Score)  Activity Eval     1.  Walking no pain 5     2. Standing no pain  2    3.  Exercising no pain 6   4.    5.    Score 4.33    Total score = sum of the activity scores/number of activities Minimum detectable change (90%CI) for average score = 2 points Minimum detectable change (90%CI) for single activity score = 3  points  COGNITION: Overall cognitive status: WFL    SENSATION: WFL for right knee and light touch Bil. Feet except left dorsum medial foot to great toe.   She reports unable to feel standing in cold water  or when she cut her left great toe.    EDEMA:  Localized edema to right knee.    MUSCLE LENGTH: Hamstrings: Tightness noted bilaterally  POSTURE: rounded shoulders, forward head, and weight shift left  PALPATION: Right knee tenderness to palpation popliteal area, hamstrings especially along the tendons at the insertion and patella tendon/medial joint line. Feet tenderness under bilateral heels and bilateral plantar fascia.  LOWER EXTREMITY ROM:   ROM Right eval Left eval  Hip flexion    Hip extension    Hip abduction    Hip adduction    Hip internal rotation    Hip external rotation  Knee flexion Seated  A: 119*   Knee extension Seated  LAQ -2*   Ankle dorsiflexion    Ankle plantarflexion    Ankle inversion    Ankle eversion     (Blank rows = not tested)  LOWER EXTREMITY MMT:  MMT Right eval Left eval  Hip flexion    Hip extension    Hip abduction    Hip adduction    Hip internal rotation    Hip external rotation    Knee flexion 4-/5   Knee extension 4/5   Ankle dorsiflexion    Ankle plantarflexion    Ankle inversion    Ankle eversion     (Blank rows = not tested)   FUNCTIONAL TESTS:    GAIT: Distance walked: 200' Assistive device utilized: None Level of assistance: Modified independence /prior send cues for gait deviations but no instability or safety issues noted Comments: Antalgic gait pattern with decreased stance RLE, decreased weight shift in terminal stance LLE but may be related to recent laceration to great toe.    TODAY'S TREATMENT                                                                          DATE: 09/17/2023:  Therapeutic Exercise:  Recumbent Bike seat 5 BLEs only, level 3 for 9 minutes gastroc stretch on incline in  straddle position, recommended to hold 30 seconds, BLEs Inclined BL heel raise. PT provided physical cues to limit hip sway during exercise. 2x10 BL heel raise into SL isometric & eccentric. PT demo and cued. 1x10 each LE. Educated on how to do exercise in pool with stability.  Review HEP, progressed clamshell with green resistance band above knee, PT provided physical cue to keep ankles together. Educated on how to do exercise standing in the pool.  Step through pattern forward & backward, zig-zag, and side-step pattern with green resistance band above knee. Progressed to resistance band just below knee with PT educating pt on progressing resistance by lowering band. Recommendation >/= 3 sessions at one level prior to progressing. Educated about adding resistance, such as weights and location of resistance, slowly. Minimum of 3x with no adverse events before adding more resistance. Pt verbalized understanding.   Self-Care Tennis ball foot arch rolls, R LE. Educated to do when in pain and in sitting initially to avoid instability. Recommended to use lacrosse ball in pool and tennis ball at home. Patient demo and verbalized understanding  Educated about compression socks for LE edema. PT demo and verbal education about correct sizing and donning. Pt verbalized understanding.   TREATMENT                                                                          DATE: 09/12/2023: Therapeutic Exercise:  Recumbent Bike seat 5 BLEs only, level 3 for 10 minutes Educated verbally about building up to 30 minutes on the bike by doing sets of 10 minutes and decreasing rest  period from 5 minutes to no rest. Educated about seat position, full contact with foot pedals without toes pointing down. Patient verbalized understanding.  Gastrocnemius stretch on step. 4 inch step with heel hanging off edge in DF position. 2x30 seconds, each leg. Review of HEP, reverse clamshell and clamshell. Provided verbal education and  physical cues to avoid hip rotation. 1x10 each Review of HEP, side-lying hip abduction. Provided verbal education and physical cues to avoid toes from pointing up during contraction. 1x10 Review of HEP, glute bridge. 1x10. Provided verbal education and physical cues to keep knees and shoulders in line.  Progressed to [facility] bridge with green resistance band, focus on keeping tension on the band. 1x10 Review of HEP, piriformis stretch. 3x 30 seconds each leg. Provided verbal education and physical cues to cross leg over knee during exercise. Patient verbalized and demonstrated understanding.   Self-Care Educated about usage of Thera Cane for gluteal area, shoulder, or foot to self-massage. Patient verbalized  understanding.   TREATMENT                                                                          DATE: 09/03/2023: Therapeutic Exercise: HEP instruction/performance c cues for techniques, handout provided.  Trial set performed of each for comprehension and symptom assessment.  See below for exercise list  Self-care: PT demo and verbal cues on positioning in side-lying for upper lower extremity support with minimal to no hip rotation and using pillows to tent sheets off of her feet.  Patient verbalized understanding.  PATIENT EDUCATION:  Education details: HEP, POC Person educated: Patient Education method: Programmer, multimedia, Demonstration, Verbal cues, and Handouts Education comprehension: verbalized understanding, returned demonstration, and verbal cues required  HOME EXERCISE PROGRAM: Access Code: [email or web address] URL: [email or web address] Date: 09/17/2023 Prepared by: Grayce Spatz   Exercises - Clamshell  - 1 x daily - 5-7 x weekly - 1-2 sets - 10 reps - 5 seconds hold - Sidelying Reverse Clamshell  - 1 x daily - 5-7 x weekly - 1-2 sets - 10 reps - 5 seconds hold - Sidelying Hip Abduction  - 1 x daily - 5-7 x weekly - 1-2 sets - 10 reps - 5 seconds hold - Supine  Piriformis Stretch with Foot on Ground  - 1-2 x daily - 7 x weekly - 1 sets - 3 reps - 30 seconds hold - Supine Bridge with Resistance Band  - 1-2 x daily - 7 x weekly - 1-2 sets - 10 reps - 5 seconds hold - Gastroc Stretch on Step  - 1-2 x daily - 7 x weekly - 1 sets - 3 reps - 30 seconds hold - Eccentric Heel Lowering on Step  - 1-2 x daily - 7 x weekly - 2-3 sets - 10 reps - 5 seconds hold - Diagonal Forward Stepping with Resistance Loop  - 1-2 x daily - 7 x weekly - 2-3 sets - 10 reps - 5 seconds hold  ASSESSMENT: CLINICAL IMPRESSION: Patient overall did well. She is eager to improve and advance her exercises but was advised to take it one step at a time and progress exercises slowly. Review and modification of HEP will continue to progress hip strengthening and stretches to achieve goals.  Patient to benefit from continued skill PT to address impairments and limitations.   OBJECTIVE IMPAIRMENTS: Abnormal gait, decreased activity tolerance, decreased balance, decreased knowledge of condition, decreased mobility, difficulty walking, decreased ROM, decreased strength, increased edema, postural dysfunction, and pain.   ACTIVITY LIMITATIONS: carrying, lifting, bending, standing, squatting, sleeping, stairs, transfers, and locomotion level  PARTICIPATION LIMITATIONS: meal prep, community activity, and occupation  PERSONAL FACTORS: Fitness, Past/current experiences, Time since onset of injury/illness/exacerbation, and 3+ comorbidities: see PMH are also affecting patient's functional outcome.   REHAB POTENTIAL: Good  CLINICAL DECISION MAKING: Stable/uncomplicated  EVALUATION COMPLEXITY: Low   GOALS: Goals reviewed with patient? Yes  SHORT TERM GOALS: (target date for Short term goals 10/01/2023)   1.  Patient will demonstrate independent use of home exercise program to maintain progress from in clinic treatments. Baseline: See objective data Goal status: Ongoing   09/12/2023  2.  Patient  reports 50% improvement in right knee pain with activities. Baseline: See objective data Goal status: Ongoing   09/12/2023  LONG TERM GOALS: (target dates for all long term goals  11/09/2023 )   1. Patient will demonstrate/report pain at worst less than or equal to 2/10 to facilitate minimal limitation in daily activity secondary to pain symptoms. Baseline: See objective data Goal status: Ongoing   09/12/2023   2. Patient will demonstrate independent use of home exercise program to facilitate ability to maintain/progress functional gains from skilled physical therapy services. Baseline: See objective data Goal status: Ongoing   09/12/2023  3.  Patient reports Patient-Specific Activity Score improved the average >7 to indicate improvement in functional activities.  Baseline: SEE OBJECTIVE DATA Goal status: Ongoing   09/12/2023   4.  Patient will demonstrate right knee LE MMT 5/5 throughout to faciltiate usual transfers, stairs, squatting at [facility] for daily life.  Baseline: See objective data Goal status: Ongoing   09/12/2023   5.  Patient patient ambulates greater than 500 feet, negotiate ramp curb and stairs with single rail modified independent with knee pain not increasing greater than 2 increments.  Baseline: See objective data Goal status: Ongoing   09/12/2023  PLAN:  PT FREQUENCY:  1-2x/week  PT DURATION: 10 weeks  PLANNED INTERVENTIONS: 97164- PT Re-evaluation, 97750- Physical Performance Testing, 97110-Therapeutic exercises, 97530- Therapeutic activity, [id number]- Neuromuscular re-education, 97535- Self Care, [id number]- Manual therapy, [id number]- Gait training, [phone]- Orthotic Initial, [phone]- Orthotic/Prosthetic subsequent, H9716- Electrical stimulation (unattended), [phone]- Electrical stimulation (manual), [id number]- Vasopneumatic device, [id number]- Ionotophoresis 4mg /ml Dexamethasone, [id number] (1-2 muscles), [id number] (3+ muscles)- Dry Needling, Patient/Family education, Balance training, Stair training, Taping,  Joint mobilization, Cryotherapy, and Moist heat  PLAN FOR NEXT SESSION: check STGs, Review HEP, continue to progress strengthening and stretching exercises, and work towards LTGs.      Ismael Nap, Student-PT, DPT 09/17/2023, 9:10 AM  This entire session of physical therapy was performed under the direct supervision of PT signing evaluation /treatment. PT reviewed note and agrees.   Grayce Spatz, PT, DPT 09/17/2023, 10:29 AM

## 2023-09-24 ENCOUNTER — Encounter: Payer: Self-pay | Admitting: Sports Medicine

## 2023-09-24 ENCOUNTER — Encounter: Admitting: Physical Therapy

## 2023-09-24 ENCOUNTER — Ambulatory Visit: Admitting: Sports Medicine

## 2023-09-24 DIAGNOSIS — M79671 Pain in right foot: Secondary | ICD-10-CM

## 2023-09-24 DIAGNOSIS — Z862 Personal history of diseases of the blood and blood-forming organs and certain disorders involving the immune mechanism: Secondary | ICD-10-CM

## 2023-09-24 DIAGNOSIS — M79672 Pain in left foot: Secondary | ICD-10-CM

## 2023-09-24 DIAGNOSIS — G5793 Unspecified mononeuropathy of bilateral lower limbs: Secondary | ICD-10-CM

## 2023-09-24 DIAGNOSIS — E569 Vitamin deficiency, unspecified: Secondary | ICD-10-CM

## 2023-09-24 DIAGNOSIS — E612 Magnesium deficiency: Secondary | ICD-10-CM

## 2023-09-24 MED ORDER — GABAPENTIN 300 MG PO CAPS: 300.0000 mg | ORAL_CAPSULE | Freq: Two times a day (BID) | ORAL | 1 refills | Status: DC

## 2023-09-24 MED ORDER — PREDNISONE 10 MG PO TABS
ORAL_TABLET | ORAL | 0 refills | Status: DC
Start: 2023-09-24 — End: 2023-12-03

## 2023-09-24 NOTE — Progress Notes (Signed)
 Patient says that she is still in a lot of pain. She is inquiring about Prednisone  or anything that may give her some relief. She says that the increase in Gabapentin  does help her sleep through the night, although she has not noticed any change in her pain by adding an extra dose. She is also inquiring about a change to that prescription so that with the added dose it will still last her 30 days. She has been in the pool a lot, and two hours at a time, doing her exercises and playing with her grandkids.

## 2023-09-24 NOTE — Progress Notes (Signed)
 Maria Becker - 63 y.o. female MRN [id number]  Date of birth: 1960-08-08  Office Visit Note: Visit Date: 09/24/2023 PCP: Shona [name] PEDLAR, MD Referred by: Shona [name] PEDLAR, MD  Subjective: Chief Complaint  Patient presents with   Right Foot - Follow-up   Left Foot - Follow-up   HPI: Maria Becker is a pleasant 63 y.o. female who presents today for follow-up of chronic bilateral foot pain as well as lower extremity neuropathy.  We have performed 2 treatments of extracorporeal shockwave therapy for her plantar fascia and plantar foot pain, she does note some improvement with this but is still having more of a burning and pins and needle sensation in the feet.  She is not diabetic, however she has dealt with neuropathy based symptoms chronically.  She has not been evaluated from a laboratory standpoint from this.  She has increased gabapentin  from 300 mg nightly to twice daily which has helped her sleep.  The 1 thing that has helped her significantly in the past is prednisone , she had this last in early May.  She continues being active with her exercises, playing in the pool and doing aquatic-based therapy.  Diabetes -no Thyroid  - yes, history of hypothyroidism, currently on Cytomel  10 mcg daily Does have a history of iron deficiency anemia History of magnesium  deficiency with current supplementation Alcohol use: Drinks 1-2 beer or glasses of wine infrequently, only wants monthly or every other month  Pertinent ROS were reviewed with the patient and found to be negative unless otherwise specified above in HPI.   Assessment & Plan: Visit Diagnoses:  1. Neuropathy involving both lower extremities   2. Vitamin deficiency   3. Bilateral foot pain   4. History of iron deficiency anemia   5. Magnesium  deficiency    Plan: Impression is chronic bilateral foot pain which is multifactorial in nature with history of plantar fascia syndrome has improved somewhat with extracorporeal shockwave  therapy.  I believe more of her pain is emanating from neuropathy of the lower extremity.  Her bilateral neuropathy is of unknown origin at this time, she is not diabetic.  She has not had a thorough workup in terms of laboratory evaluation, we will initiate this today, see labs drawn below.  She will continue her arch straps for support.  She will continue gabapentin  300 mg twice daily.  For her acute joint pain and arthralgia, we will place her on a lower dose but a longer duration of oral prednisone  over the next 12 days and then discontinue.  We will follow-up in about 3 weeks to see her response as well as review all her laboratory workup and discuss next steps.  Additional considerations: EMG/nerve conduction study  She may be a candidate for Qutenza treatment, although would need a neuropathic specific cause diagnosis  Follow-up: Return in about 3 weeks (around 10/15/2023) for 30-min f/u for b/l foot pain, f/u labs, cons shockwave .   Meds & Orders:  Meds ordered this encounter  Medications   gabapentin  (NEURONTIN ) 300 MG capsule    Sig: Take 1 capsule (300 mg total) by mouth 2 (two) times daily.    Dispense:  60 capsule    Refill:  1   predniSONE  (DELTASONE ) 10 MG tablet    Sig: Take 4 tabs (40mg ) x 3 days, then Take 3 tabs (30mg ) x 3 days, then Take 2 tabs (20mg ) x 3 days, then Take 1 tab (10mg ) x 3 days until completion    Dispense:  30 tablet    Refill:  0    Orders Placed This Encounter  Procedures   TSH   CBC with Differential   Fe+TIBC+Fer   B12   Folate   Vitamin B1   Copper, serum   Protein Electrophoresis, (serum)   Magnesium      Procedures: No procedures performed      Clinical History: No specialty comments available.  She reports that she has never smoked. She has never used smokeless tobacco. No results for input(s): HGBA1C, LABURIC in the last 8760 hours.  Objective:    Physical Exam  Gen: Well-appearing, in no acute distress; non-toxic CV:  Well-perfused. Warm.  Resp: Breathing unlabored on room air; no wheezing. Psych: Fluid speech in conversation; appropriate affect; normal thought process  Imaging: No results found.  Past Medical/Family/Surgical/Social History: Medications & Allergies reviewed per EMR, new medications updated. Patient Active Problem List   Diagnosis Date Noted   S/P laparoscopic supracervical hysterectomy 07/11/2023   Urge incontinence of urine 07/11/2023   Skin tag 07/11/2023   Encounter for screening fecal occult blood testing 07/11/2023   Encounter for gynecological examination with Papanicolaou smear of cervix 07/11/2023   Rectocele 07/11/2023   Status post total left knee replacement 07/22/2021   Unilateral primary osteoarthritis, left knee 06/13/2021   Dyspnea on exertion    Angina decubitus (HCC) 07/20/2015   CAD (coronary artery disease), native coronary artery, LAD on cardiac CT 07/20/2015   HTN (hypertension) 07/20/2015   Family history of premature CAD 07/20/2015   Hyperlipidemia LDL goal <70 07/20/2015   Angina at rest [facility]) 07/20/2015   Abnormal nuclear stress test    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 cath 07/2015: 30-50% mid-LAD stenosis but otherwise normal cors   Diastolic CHF, chronic (HCC)    Dyspnea    SOME SOB WITH EXERTION   Family history of adverse reaction to anesthesia    MOTHER SLOW TO WAKE UP   Fibromyalgia    GERD (gastroesophageal reflux disease)    Headache    History of kidney stones    HTN (hypertension) 07/20/2015   Hyperlipidemia    a. not currently on statin therapy   Hypothyroidism    Insomnia    Pneumonia    HX OF AS A BABY   Family History  Problem Relation Age of Onset   Diabetes Paternal Grandfather    Heart disease Paternal Grandfather    Diabetes Paternal Grandmother    Heart disease Paternal Grandmother     Diabetes Maternal Grandmother    Heart disease Maternal Grandmother    Diabetes Maternal Grandfather    Heart disease Maternal Grandfather    Melanoma Father    Diabetes Father    Macular degeneration Mother    COPD Mother    Heart attack Mother 25   Diabetes Mother    Aneurysm Mother    Diabetes Brother    Heart attack Brother 44   Kidney disease Brother    Heart disease Sister    Diabetes Sister    Macular degeneration Sister    Macular degeneration Maternal Aunt    Cancer - Colon Maternal Aunt    Kidney disease Maternal Aunt    Diabetes Maternal Aunt    Aneurysm Maternal Uncle    Heart attack Paternal Aunt    Hypothyroidism Paternal Aunt    Aneurysm  Cousin    Past Surgical History:  Procedure Laterality Date   ABDOMINAL HYSTERECTOMY     BACK SURGERY     CARDIAC CATHETERIZATION N/A 07/20/2015   Procedure: Left Heart Cath and Coronary Angiography;  Surgeon: Victory LELON Sharps, MD;  Location: [facility] INVASIVE CV LAB;  Service: Cardiovascular;  Laterality: N/A;   CESAREAN SECTION     L4-L5 fusion     RIGHT/LEFT HEART CATH AND CORONARY ANGIOGRAPHY N/A 04/15/2021   Procedure: RIGHT/LEFT HEART CATH AND CORONARY ANGIOGRAPHY;  Surgeon: Verlin Lonni BIRCH, MD;  Location: [facility] INVASIVE CV LAB;  Service: Cardiovascular;  Laterality: N/A;   TOTAL KNEE ARTHROPLASTY Left 07/22/2021   Procedure: LEFT TOTAL KNEE ARTHROPLASTY;  Surgeon: Vernetta Lonni GRADE, MD;  Location: [facility] ORS;  Service: Orthopedics;  Laterality: Left;  RFNA   Social History   Occupational History   Occupation: self-employed  Tobacco Use   Smoking status: Never   Smokeless tobacco: Never  Vaping Use   Vaping status: Never Used  Substance and Sexual Activity   Alcohol use: Yes    Comment: occasional   Drug use: No   Sexual activity: Not Currently    Birth control/protection: Surgical    Comment: hyst

## 2023-09-25 LAB — MAGNESIUM: Magnesium: 2.2 mg/dL (ref 1.5–2.5)

## 2023-09-26 ENCOUNTER — Ambulatory Visit: Admitting: Sports Medicine

## 2023-09-28 LAB — PROTEIN ELECTROPHORESIS, SERUM
Albumin ELP: 4.1 g/dL (ref 3.8–4.8)
Alpha 1: 0.3 g/dL (ref 0.2–0.3)
Alpha 2: 0.8 g/dL (ref 0.5–0.9)
Beta 2: 0.4 g/dL (ref 0.2–0.5)
Beta Globulin: 0.4 g/dL (ref 0.4–0.6)
Gamma Globulin: 0.9 g/dL (ref 0.8–1.7)
Total Protein: 6.9 g/dL (ref 6.1–8.1)

## 2023-09-28 LAB — CBC WITH DIFFERENTIAL/PLATELET
Absolute Lymphocytes: 2553 {cells}/uL (ref 850–3900)
Absolute Monocytes: 474 {cells}/uL (ref 200–950)
Basophils Absolute: 59 {cells}/uL (ref 0–200)
Basophils Relative: 0.8 %
Eosinophils Absolute: 244 {cells}/uL (ref 15–500)
Eosinophils Relative: 3.3 %
HCT: 43.8 % (ref 35.0–45.0)
Hemoglobin: 14 g/dL (ref 11.7–15.5)
MCH: 29.1 pg (ref 27.0–33.0)
MCHC: 32 g/dL (ref 32.0–36.0)
MCV: 91.1 fL (ref 80.0–100.0)
MPV: 11.1 fL (ref 7.5–12.5)
Monocytes Relative: 6.4 %
Neutro Abs: 4070 {cells}/uL (ref 1500–7800)
Neutrophils Relative %: 55 %
Platelets: 268 Thousand/uL (ref 140–400)
RBC: 4.81 Million/uL (ref 3.80–5.10)
RDW: 14.4 % (ref 11.0–15.0)
Total Lymphocyte: 34.5 %
WBC: 7.4 Thousand/uL (ref 3.8–10.8)

## 2023-09-28 LAB — IRON,TIBC AND FERRITIN PANEL
%SAT: 20 % (ref 16–45)
Ferritin: 35 ng/mL (ref 16–288)
Iron: 70 ug/dL (ref 45–160)
TIBC: 348 ug/dL (ref 250–450)

## 2023-09-28 LAB — FOLATE: Folate: 15.1 ng/mL

## 2023-09-28 LAB — TSH: TSH: 6.1 m[IU]/L — ABNORMAL HIGH (ref 0.40–4.50)

## 2023-09-28 LAB — VITAMIN B1

## 2023-09-28 LAB — COPPER, SERUM

## 2023-09-28 LAB — VITAMIN B12: Vitamin B-12: 487 pg/mL (ref 200–1100)

## 2023-09-30 ENCOUNTER — Ambulatory Visit: Payer: Self-pay | Admitting: Sports Medicine

## 2023-10-01 ENCOUNTER — Encounter: Admitting: Physical Therapy

## 2023-10-04 DIAGNOSIS — R0683 Snoring: Secondary | ICD-10-CM | POA: Diagnosis not present

## 2023-10-04 DIAGNOSIS — E039 Hypothyroidism, unspecified: Secondary | ICD-10-CM | POA: Diagnosis not present

## 2023-10-04 DIAGNOSIS — M722 Plantar fascial fibromatosis: Secondary | ICD-10-CM | POA: Diagnosis not present

## 2023-10-04 DIAGNOSIS — E785 Hyperlipidemia, unspecified: Secondary | ICD-10-CM | POA: Diagnosis not present

## 2023-10-04 DIAGNOSIS — M1711 Unilateral primary osteoarthritis, right knee: Secondary | ICD-10-CM | POA: Diagnosis not present

## 2023-10-04 DIAGNOSIS — I5032 Chronic diastolic (congestive) heart failure: Secondary | ICD-10-CM | POA: Diagnosis not present

## 2023-10-04 DIAGNOSIS — R7303 Prediabetes: Secondary | ICD-10-CM | POA: Diagnosis not present

## 2023-10-04 DIAGNOSIS — F411 Generalized anxiety disorder: Secondary | ICD-10-CM | POA: Diagnosis not present

## 2023-10-04 DIAGNOSIS — G4700 Insomnia, unspecified: Secondary | ICD-10-CM | POA: Diagnosis not present

## 2023-10-04 DIAGNOSIS — Z0001 Encounter for general adult medical examination with abnormal findings: Secondary | ICD-10-CM | POA: Diagnosis not present

## 2023-10-04 DIAGNOSIS — I10 Essential (primary) hypertension: Secondary | ICD-10-CM | POA: Diagnosis not present

## 2023-10-08 ENCOUNTER — Ambulatory Visit: Admitting: Physical Therapy

## 2023-10-08 ENCOUNTER — Encounter: Payer: Self-pay | Admitting: Physical Therapy

## 2023-10-08 DIAGNOSIS — M6281 Muscle weakness (generalized): Secondary | ICD-10-CM

## 2023-10-08 DIAGNOSIS — M79671 Pain in right foot: Secondary | ICD-10-CM

## 2023-10-08 DIAGNOSIS — G8929 Other chronic pain: Secondary | ICD-10-CM | POA: Diagnosis not present

## 2023-10-08 DIAGNOSIS — M79672 Pain in left foot: Secondary | ICD-10-CM | POA: Diagnosis not present

## 2023-10-08 DIAGNOSIS — R2689 Other abnormalities of gait and mobility: Secondary | ICD-10-CM

## 2023-10-08 DIAGNOSIS — M25561 Pain in right knee: Secondary | ICD-10-CM | POA: Diagnosis not present

## 2023-10-08 NOTE — Patient Instructions (Signed)
 Hypothyroidism: My

## 2023-10-08 NOTE — Therapy (Addendum)
 OUTPATIENT PHYSICAL THERAPY LOWER EXTREMITY TREATMENT   Patient Name: Maria Becker MRN: [id number] DOB:15-May-1960, 63 y.o., female Today's Date: 10/08/2023  PHYSICAL THERAPY DISCHARGE SUMMARY  Visits from Start of Care: 4  Current functional level related to goals / functional outcomes: See below. Unknown progress as patient not return to PT.    Remaining deficits: See below   Education / Equipment: Patient was instructed in HEP which they appeared to understand.    Patient agrees to discharge. Patient goals were Unknown as did not return. Patient is being discharged due to not returning since the last visit.    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 cath 07/2015: 30-50% mid-LAD stenosis but otherwise normal cors   Diastolic CHF, chronic (HCC)    Dyspnea    SOME SOB WITH EXERTION   Family history of adverse reaction to anesthesia    MOTHER SLOW TO WAKE UP   Fibromyalgia    GERD (gastroesophageal reflux disease)    Headache    History of kidney stones    HTN (hypertension) 07/20/2015   Hyperlipidemia    a. not currently on statin therapy   Hypothyroidism    Insomnia    Pneumonia    HX OF AS A BABY   Past Surgical History:  Procedure Laterality Date   ABDOMINAL HYSTERECTOMY     BACK SURGERY     CARDIAC CATHETERIZATION N/A 07/20/2015   Procedure: Left Heart Cath and Coronary Angiography;   Surgeon: Victory LELON Sharps, MD;  Location: [facility] INVASIVE CV LAB;  Service: Cardiovascular;  Laterality: N/A;   CESAREAN SECTION     L4-L5 fusion     RIGHT/LEFT HEART CATH AND CORONARY ANGIOGRAPHY N/A 04/15/2021   Procedure: RIGHT/LEFT HEART CATH AND CORONARY ANGIOGRAPHY;  Surgeon: Verlin Lonni BIRCH, MD;  Location: [facility] INVASIVE CV LAB;  Service: Cardiovascular;  Laterality: N/A;   TOTAL KNEE ARTHROPLASTY Left 07/22/2021   Procedure: LEFT TOTAL KNEE ARTHROPLASTY;  Surgeon: Vernetta Lonni GRADE, MD;  Location: [facility] ORS;  Service: Orthopedics;  Laterality: Left;  RFNA   Patient Active Problem List   Diagnosis Date Noted   S/P laparoscopic supracervical hysterectomy 07/11/2023   Urge incontinence of urine 07/11/2023   Skin tag 07/11/2023   Encounter for screening fecal occult blood testing 07/11/2023   Encounter for gynecological examination with Papanicolaou smear of cervix 07/11/2023   Rectocele 07/11/2023   Status post total left knee replacement 07/22/2021   Unilateral primary osteoarthritis, left knee 06/13/2021   Dyspnea on exertion    Angina decubitus (HCC) 07/20/2015   CAD (coronary artery disease), native coronary artery, LAD on cardiac CT 07/20/2015   HTN (hypertension) 07/20/2015   Family history of premature CAD 07/20/2015   Hyperlipidemia LDL goal <  70 07/20/2015   Angina at rest [facility]) 07/20/2015   Abnormal nuclear stress test    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 Patient did nothing out of the ordinary to cause increase of pain. She has been consistent with HEP in the pool. Her knee pain is better but foot pain is worsening.  At her previous MD appointment, her labs indicated she has hypothyroidism. Dr. Burnetta recommended Wonda band with arch support that goes around midfoot.  She has ordered it but it has not come in yet.  She did not order the compression socks that PT recommended.   PERTINENT HISTORY: Left TKA 07/22/21, OA, CAD, CHF, dyspnea, fibromyalgia, HTN, HLD, Hypothyroidism, back sg L4-5 fusion  DIAGNOSTIC FINDINGS: 08/03/23 right knee MR impression Tricompartmental osteoarthrosis most notably of the lateral and lateral patellofemoral compartment. There is a moderate-sized reactive joint effusion and moderate-sized leaking Baker's cyst.   Nondisplaced horizontal tear of the anterior horn and anterior body of the lateral meniscus.  PAIN:  Are you having pain? Yes: NPRS scale: 5-6/10 pain at all times, pain in foot Pain location: right knee posteriorly & around knee cap Pain description:  at rest ache,  standing /walking back feels like being cut tearing and medial distal patella sharp Aggravating factors: sleeping, standing & walking Relieving factors: 2 Tylenol  or Aleve  Voltaren  cream  Feet 5-8/10  Relieving factors elevation Aggravating factors: standing & walking  PRECAUTIONS: None  RED FLAGS: None   WEIGHT BEARING RESTRICTIONS: No  FALLS:  Has patient fallen in last 6 months? 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 0 1 2 3 4 5 6 7 8 9  10 (Date and Score)  Activity Eval  10/08/23   1.  Walking no pain 5  5   2. Standing no pain  2  5  3.  Exercising no pain 6 6  4.    5.    Score 4.33 5.33   Total score = sum of the activity scores/number of activities Minimum detectable change (90%CI) for average score = 2 points Minimum detectable change (90%CI) for single activity score = 3 points  COGNITION: Overall cognitive status: WFL    SENSATION: WFL for right knee and light touch Bil. Feet except left dorsum medial foot to great toe.   She reports unable to feel standing in cold water  or  when she cut her left great toe.    EDEMA:  Localized edema to right knee.    MUSCLE LENGTH: Hamstrings: Tightness noted bilaterally  POSTURE: rounded shoulders, forward head, and weight shift left  PALPATION: Right knee tenderness to palpation popliteal area, hamstrings especially along the tendons at the insertion and patella tendon/medial joint line. Feet tenderness under bilateral heels and bilateral plantar fascia.  LOWER EXTREMITY ROM:   ROM Right eval Left eval  Hip flexion    Hip extension    Hip abduction    Hip adduction    Hip internal rotation    Hip external rotation    Knee flexion Seated  A: 119*   Knee extension Seated  LAQ -2*   Ankle dorsiflexion    Ankle plantarflexion    Ankle inversion    Ankle eversion     (Blank rows = not tested)  LOWER EXTREMITY MMT:  MMT Right eval Left eval  Hip flexion    Hip extension    Hip abduction    Hip adduction    Hip internal rotation    Hip external rotation    Knee flexion 4-/5   Knee extension 4/5   Ankle dorsiflexion    Ankle plantarflexion    Ankle inversion    Ankle eversion     (Blank  rows = not tested)   FUNCTIONAL TESTS:    GAIT: Distance walked: 200' Assistive device utilized: None Level of assistance: Modified independence /prior send cues for gait deviations but no instability or safety issues noted Comments: Antalgic gait pattern with decreased stance RLE, decreased weight shift in terminal stance LLE but may be related to recent laceration to great toe.    TODAY'S TREATMENT                                                                          DATE: 10/08/2023:  Therapeutic Exercise:  Recumbent Bike seat 5 BLEs only, level 3 for 9 minutes Step up, 6 inch box, 3 sets x 8 reps BUE support  Neuro Re-ed Tandem stance for 30 seconds each: RLE behind & in front - floor w/ eyes open, floor w/ eyes closed, on foam w/ eyes open.  No UE support.  Moderate sway with eyes closed. PT added to HEP with HO. Pt verbalized understanding after performing in clinic.  Star taps on foam: 3x8.  Cues to reach out further with LE and to switch legs between sets due to foot pain.  Single UE support. PT added to HEP with HO. Pt verbalized understanding after performing in clinic.   Self-Care: PT verbally educated patient about what hypothyroidism myopathy is and the symptoms it could cause. Edema can be component of myopathy and neuropathy with Hypothyroidism Patient verbalized understanding PT verbally educated patient about the importance of using compression socks and how and when to use them.  Patient verbalized understanding    TREATMENT  DATE: 09/17/2023:  Therapeutic Exercise:  Recumbent Bike seat 5 BLEs only, level 3 for 9 minutes gastroc stretch on incline in straddle position, recommended to hold 30 seconds, BLEs Inclined BL heel raise. PT provided physical cues to limit hip sway during exercise. 2x10 BL heel raise into SL isometric & eccentric. PT demo and cued. 1x10 each LE. Educated on how to do  exercise in pool with stability.  Review HEP, progressed clamshell with green resistance band above knee, PT provided physical cue to keep ankles together. Educated on how to do exercise standing in the pool.  Step through pattern forward & backward, zig-zag, and side-step pattern with green resistance band above knee. Progressed to resistance band just below knee with PT educating pt on progressing resistance by lowering band. Recommendation >/= 3 sessions at one level prior to progressing. Educated about adding resistance, such as weights and location of resistance, slowly. Minimum of 3x with no adverse events before adding more resistance. Pt verbalized understanding.   Self-Care Tennis ball foot arch rolls, R LE. Educated to do when in pain and in sitting initially to avoid instability. Recommended to use lacrosse ball in pool and tennis ball at home. Patient demo and verbalized understanding  Educated about compression socks for LE edema. PT demo and verbal education about correct sizing and donning. Pt verbalized understanding.   TREATMENT                                                                          DATE: 09/12/2023: Therapeutic Exercise:  Recumbent Bike seat 5 BLEs only, level 3 for 10 minutes Educated verbally about building up to 30 minutes on the bike by doing sets of 10 minutes and decreasing rest period from 5 minutes to no rest. Educated about seat position, full contact with foot pedals without toes pointing down. Patient verbalized understanding.  Gastrocnemius stretch on step. 4 inch step with heel hanging off edge in DF position. 2x30 seconds, each leg. Review of HEP, reverse clamshell and clamshell. Provided verbal education and physical cues to avoid hip rotation. 1x10 each Review of HEP, side-lying hip abduction. Provided verbal education and physical cues to avoid toes from pointing up during contraction. 1x10 Review of HEP, glute bridge. 1x10. Provided verbal  education and physical cues to keep knees and shoulders in line.  Progressed to [facility] bridge with green resistance band, focus on keeping tension on the band. 1x10 Review of HEP, piriformis stretch. 3x 30 seconds each leg. Provided verbal education and physical cues to cross leg over knee during exercise. Patient verbalized and demonstrated understanding.  Self-Care Educated about usage of Thera Cane for gluteal area, shoulder, or foot to self-massage. Patient verbalized  understanding.  PATIENT EDUCATION:  Education details: HEP, POC Person educated: Patient Education method: Programmer, multimedia, Demonstration, Verbal cues, and Handouts Education comprehension: verbalized understanding, returned demonstration, and verbal cues required  HOME EXERCISE PROGRAM: Access Code: [email or web address] URL: [email or web address] Date: 10/08/2023 Prepared by: Grayce Spatz   Exercises - Clamshell  - 1 x daily - 5-7 x weekly - 1-2 sets - 10 reps - 5 seconds hold - Sidelying Reverse Clamshell  - 1 x daily - 5-7 x weekly - 1-2 sets - 10 reps - 5  seconds hold - Sidelying Hip Abduction  - 1 x daily - 5-7 x weekly - 1-2 sets - 10 reps - 5 seconds hold - Supine Piriformis Stretch with Foot on Ground  - 1-2 x daily - 7 x weekly - 1 sets - 3 reps - 30 seconds hold - Supine Bridge with Resistance Band  - 1-2 x daily - 7 x weekly - 1-2 sets - 10 reps - 5 seconds hold - Gastroc Stretch on Step  - 1-2 x daily - 7 x weekly - 1 sets - 3 reps - 30 seconds hold - Eccentric Heel Lowering on Step  - 1-2 x daily - 7 x weekly - 2-3 sets - 10 reps - 5 seconds hold - Diagonal Forward Stepping with Resistance Loop  - 1-2 x daily - 7 x weekly - 2-3 sets - 10 reps - 5 seconds hold - Tandem Stance  - 1 x daily - 3-5 x weekly - 4 sets - 2 reps - 30 seconds hold - Star Taps  - 1 x daily - 3-5 x weekly - 1 sets - 10 reps  ASSESSMENT: CLINICAL IMPRESSION: Patient overall did well today.  Her knee pain has gotten better but her foot  pain is worsening.  Patient responded well to balance exercises introduced today.  She will benefit from balance exercises that challenge her single-leg stance time.  Patient to continue to benefit from skilled physical therapy to address impairments  OBJECTIVE IMPAIRMENTS: Abnormal gait, decreased activity tolerance, decreased balance, decreased knowledge of condition, decreased mobility, difficulty walking, decreased ROM, decreased strength, increased edema, postural dysfunction, and pain.   ACTIVITY LIMITATIONS: carrying, lifting, bending, standing, squatting, sleeping, stairs, transfers, and locomotion level  PARTICIPATION LIMITATIONS: meal prep, community activity, and occupation  PERSONAL FACTORS: Fitness, Past/current experiences, Time since onset of injury/illness/exacerbation, and 3+ comorbidities: see PMH are also affecting patient's functional outcome.   REHAB POTENTIAL: Good  CLINICAL DECISION MAKING: Stable/uncomplicated  EVALUATION COMPLEXITY: Low   GOALS: Goals reviewed with patient? Yes  SHORT TERM GOALS: (target date for Short term goals 10/01/2023)    1.  Patient will demonstrate independent use of home exercise program to maintain progress from in clinic treatments. Baseline: See objective data Goal status: Met, 10/08/2023  2.  Patient reports 50% improvement in right knee pain with activities. Baseline: See objective data Goal status: Not Met, 10/08/2023  LONG TERM GOALS: (target dates for all long term goals  11/09/2023 )   1. Patient will demonstrate/report pain at worst less than or equal to 2/10 to facilitate minimal limitation in daily activity secondary to pain symptoms. Baseline: See objective data Goal status: Ongoing   09/12/2023   2. Patient will demonstrate independent use of home exercise program to facilitate ability to maintain/progress functional gains from skilled physical therapy services. Baseline: See objective data Goal status: Ongoing    09/12/2023  3.  Patient reports Patient-Specific Activity Score improved the average >7 to indicate improvement in functional activities.  Baseline: SEE OBJECTIVE DATA Goal status: Ongoing   09/12/2023   4.  Patient will demonstrate right knee LE MMT 5/5 throughout to faciltiate usual transfers, stairs, squatting at [facility] for daily life.  Baseline: See objective data Goal status: Ongoing   09/12/2023   5.  Patient patient ambulates greater than 500 feet, negotiate ramp curb and stairs with single rail modified independent with knee pain not increasing greater than 2 increments.  Baseline: See objective data Goal status: Ongoing   09/12/2023  PLAN:  PT FREQUENCY:  1-2x/week  PT DURATION: 10 weeks  PLANNED INTERVENTIONS: 97164- PT Re-evaluation, 97750- Physical Performance Testing, 97110-Therapeutic exercises, 97530- Therapeutic activity, [id number]- Neuromuscular re-education, 97535- Self Care, [id number]- Manual therapy, [id number]- Gait training, [id number]- Orthotic Initial, [phone]- Orthotic/Prosthetic subsequent, [id number]- Electrical stimulation (unattended), [phone]- Electrical stimulation (manual), [id number]- Vasopneumatic device, [id number]- Ionotophoresis 4mg /ml Dexamethasone, 79439 (1-2 muscles), 20561 (3+ muscles)- Dry Needling, Patient/Family education, Balance training, Stair training, Taping, Joint mobilization, Cryotherapy, and Moist heat  PLAN FOR NEXT SESSION: Continue to progress strengthening and stretching exercises, create SLS challenging exercises, and work towards LTGs.      Ismael Nap, Student-PT, DPT 10/08/2023, 9:06 AM  This entire session of physical therapy was performed under the direct supervision of PT signing evaluation /treatment. PT reviewed note and agrees.   Grayce Spatz, PT, DPT 10/08/2023, 11:34 AM

## 2023-10-15 ENCOUNTER — Encounter: Payer: Self-pay | Admitting: Sports Medicine

## 2023-10-15 ENCOUNTER — Ambulatory Visit: Admitting: Sports Medicine

## 2023-10-15 DIAGNOSIS — M79672 Pain in left foot: Secondary | ICD-10-CM | POA: Diagnosis not present

## 2023-10-15 DIAGNOSIS — E039 Hypothyroidism, unspecified: Secondary | ICD-10-CM | POA: Diagnosis not present

## 2023-10-15 DIAGNOSIS — G5793 Unspecified mononeuropathy of bilateral lower limbs: Secondary | ICD-10-CM | POA: Diagnosis not present

## 2023-10-15 DIAGNOSIS — M79671 Pain in right foot: Secondary | ICD-10-CM

## 2023-10-15 DIAGNOSIS — M722 Plantar fascial fibromatosis: Secondary | ICD-10-CM | POA: Diagnosis not present

## 2023-10-15 NOTE — Patient Instructions (Signed)
 Maria Becker to see you again today. 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 Looks like you have seen [place] [facility] in the past, this is where the referral was placed.  As a reminder, we did obtain labs which showed an elevated TSH, indicative of an underactive thyroid .  You will continue following up with your PCP for this.  There were 2 labs that were unable to be collected: -Thiamine (Vitamin B1) - Copper   *These are ones that the neurologist can likely test for as well.  I would like you to bring your previous records of heavy metal lab abnormalities that way they can see these and repeat as indicated  - I also recommended a nerve conduction study to evaluate the quality of the nerves in the feet and lower legs.  I put this into the referral for recommendation, neurology is able to perform these.  Let me know if you have questions, Dr. Burnetta

## 2023-10-15 NOTE — Progress Notes (Signed)
 Patient says that she is still in the same pain that she was at her last visit. She says that the Prednisone  did help while she was taking it, but her pain returned within a few days of finishing it. She is here today for follow up of her labs.

## 2023-10-15 NOTE — Progress Notes (Signed)
 Maria Becker - 63 y.o. female MRN [id number]  Date of birth: October 01, 1960  Office Visit Note: Visit Date: 10/15/2023 PCP: Shona [name] PEDLAR, MD Referred by: Shona [name] PEDLAR, MD  Subjective: Chief Complaint  Patient presents with   Right Foot - Follow-up   Left Foot - Follow-up   HPI: Maria Becker is a pleasant 63 y.o. female who presents today for follow-up of bilateral chronic foot pain as well as lower extremity and foot neuropathy.  Maria Becker is still dealing with some pain in her bilateral feet which is indicative of more neuropathy based pain.  She did have some improvement on the prednisone  which always helps when she is taking it but then this will slowly return.  She does notice a difference with gabapentin , currently taking 300 mg twice daily.  She is here to follow-up with labs as well.  She does follow-up with her primary physician regarding her hypothyroidism, her last TSH when I checked was 6.10.  This was in the mid fours last visit as well as down to 0.5 previously.  They recently increased from 25 mcg to 50 mcg on her levothyroxine.  She does tell me today that many years ago she did have an extensive lab work that showed elevated heavy metals.  She is interested in repeating this in the future, does have her old paperwork at home.  At that time she was able to cut out gluten, corn, dairy, soy, sugar and had significant improvement in her whole body pain and neuropathy.  Pertinent ROS were reviewed with the patient and found to be negative unless otherwise specified above in HPI.   Assessment & Plan: Visit Diagnoses:  1. Neuropathy involving both lower extremities   2. Bilateral foot pain   3. Hypothyroidism, unspecified type   4. Plantar fascia syndrome    Plan: History is ongoing neuropathy bilateral feet with burning, pins and needle sensation in both the dorsal and plantar aspect of the feet.  She is a nondiabetic, although does have hypothyroidism with recent TSH  that has been quite fluctuating.  In the past she did have elevated heavy metal labs many years ago, she has previous records for this.  She will continue in her supportive shoe wear, I did recommend increasing her gabapentin  from 300 mg twice daily to 300 mg a.m., 600 mg nightly.  I also would like to send her to neurology for further evaluation and workup, that I think would be helpful to perform nerve conduction study to evaluate the small fibers to help treat her neuropathy based pain.  Additional treatment considerations may include Qutenza application, could pursue this via Dr. Bettyann.  Recommended dietary recommendations as she is followed in the past which was extremely helpful for her overall body health.  She will see neurology and I will see her back in follow-up after completion of their workup and treatment.  Follow-up: Return for referral to neurology, will f/u after their work-up and completion.   Meds & Orders: No orders of the defined types were placed in this encounter.   Orders Placed This Encounter  Procedures   Ambulatory referral to Neurology     Procedures: No procedures performed      Clinical History: No specialty comments available.  She reports that she has never smoked. She has never used smokeless tobacco. No results for input(s): HGBA1C, LABURIC in the last 8760 hours.  Objective:    Physical Exam  Gen: Well-appearing, in no acute  distress; non-toxic CV: Well-perfused. Warm.  Resp: Breathing unlabored on room air; no wheezing. Psych: Fluid speech in conversation; appropriate affect; normal thought process  Ortho Exam - Bilateral feet: There is no redness swelling or effusion of the feet or ankle joints.  There is widening of the forefoot with loss of transverse arch bilaterally.  Imaging: No results found.  Past Medical/Family/Surgical/Social History: Medications & Allergies reviewed per EMR, new medications updated. Patient Active Problem List    Diagnosis Date Noted   S/P laparoscopic supracervical hysterectomy 07/11/2023   Urge incontinence of urine 07/11/2023   Skin tag 07/11/2023   Encounter for screening fecal occult blood testing 07/11/2023   Encounter for gynecological examination with Papanicolaou smear of cervix 07/11/2023   Rectocele 07/11/2023   Status post total left knee replacement 07/22/2021   Unilateral primary osteoarthritis, left knee 06/13/2021   Dyspnea on exertion    Angina decubitus (HCC) 07/20/2015   CAD (coronary artery disease), native coronary artery, LAD on cardiac CT 07/20/2015   HTN (hypertension) 07/20/2015   Family history of premature CAD 07/20/2015   Hyperlipidemia LDL goal <70 07/20/2015   Angina at rest [facility]) 07/20/2015   Abnormal nuclear stress test    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 cath 07/2015: 30-50% mid-LAD stenosis but otherwise normal cors   Diastolic CHF, chronic (HCC)    Dyspnea    SOME SOB WITH EXERTION   Family history of adverse reaction to anesthesia    MOTHER SLOW TO WAKE UP   Fibromyalgia    GERD (gastroesophageal reflux disease)    Headache    History of kidney stones    HTN (hypertension) 07/20/2015   Hyperlipidemia    a. not currently on statin therapy   Hypothyroidism    Insomnia    Pneumonia    HX OF AS A BABY   Family History  Problem Relation Age of Onset   Diabetes Paternal Grandfather    Heart disease Paternal Grandfather    Diabetes Paternal Grandmother    Heart disease Paternal Grandmother    Diabetes Maternal Grandmother    Heart disease Maternal Grandmother    Diabetes Maternal Grandfather    Heart disease Maternal Grandfather    Melanoma Father    Diabetes Father    Macular degeneration Mother    COPD Mother    Heart attack Mother 45   Diabetes Mother    Aneurysm Mother    Diabetes Brother    Heart  attack Brother 28   Kidney disease Brother    Heart disease Sister    Diabetes Sister    Macular degeneration Sister    Macular degeneration Maternal Aunt    Cancer - Colon Maternal Aunt    Kidney disease Maternal Aunt    Diabetes Maternal Aunt    Aneurysm Maternal Uncle    Heart attack Paternal Aunt    Hypothyroidism Paternal Aunt    Aneurysm Cousin    Past Surgical History:  Procedure Laterality Date   ABDOMINAL HYSTERECTOMY     BACK SURGERY     CARDIAC CATHETERIZATION N/A 07/20/2015   Procedure: Left Heart Cath and Coronary Angiography;  Surgeon: Victory LELON Sharps, MD;  Location: [facility] INVAS[facility];  Service: Cardiovascular;  Laterality: N/A;   CESAREAN SECTION     L4-L5 fusion     RIGHT/LEFT HEART CATH AND CORONARY  ANGIOGRAPHY N/A 04/15/2021   Procedure: RIGHT/LEFT HEART CATH AND CORONARY ANGIOGRAPHY;  Surgeon: Verlin Lonni BIRCH, MD;  Location: [facility] INVASIVE CV LAB;  Service: Cardiovascular;  Laterality: N/A;   TOTAL KNEE ARTHROPLASTY Left 07/22/2021   Procedure: LEFT TOTAL KNEE ARTHROPLASTY;  Surgeon: Vernetta Lonni GRADE, MD;  Location: [facility];  Service: Orthopedics;  Laterality: Left;  RFNA   Social History   Occupational History   Occupation: self-employed  Tobacco Use   Smoking status: Never   Smokeless tobacco: Never  Vaping Use   Vaping status: Never Used  Substance and Sexual Activity   Alcohol use: Yes    Comment: occasional   Drug use: No   Sexual activity: Not Currently    Birth control/protection: Surgical    Comment: hyst

## 2023-10-16 ENCOUNTER — Other Ambulatory Visit: Payer: Self-pay | Admitting: Sports Medicine

## 2023-10-16 ENCOUNTER — Encounter: Payer: Self-pay | Admitting: Sports Medicine

## 2023-10-16 MED ORDER — GABAPENTIN 300 MG PO CAPS: ORAL_CAPSULE | ORAL | 1 refills | Status: DC

## 2023-10-17 ENCOUNTER — Encounter: Admitting: Physical Therapy

## 2023-10-22 ENCOUNTER — Encounter: Admitting: Physical Therapy

## 2023-10-29 ENCOUNTER — Encounter: Admitting: Physical Therapy

## 2023-10-29 ENCOUNTER — Ambulatory Visit: Admitting: Sports Medicine

## 2023-10-29 ENCOUNTER — Other Ambulatory Visit: Payer: Self-pay | Admitting: Sports Medicine

## 2023-10-29 ENCOUNTER — Encounter: Payer: Self-pay | Admitting: Sports Medicine

## 2023-10-29 DIAGNOSIS — M722 Plantar fascial fibromatosis: Secondary | ICD-10-CM

## 2023-10-29 DIAGNOSIS — G5793 Unspecified mononeuropathy of bilateral lower limbs: Secondary | ICD-10-CM

## 2023-10-29 DIAGNOSIS — M79671 Pain in right foot: Secondary | ICD-10-CM | POA: Diagnosis not present

## 2023-10-29 DIAGNOSIS — M79672 Pain in left foot: Secondary | ICD-10-CM

## 2023-10-29 NOTE — Telephone Encounter (Signed)
 Referral sent

## 2023-10-29 NOTE — Progress Notes (Signed)
 Maria Becker - 63 y.o. female MRN [id number]  Date of birth: 07-Sep-1960  Office Visit Note: Visit Date: 10/29/2023 PCP: Maria [name] PEDLAR, MD Referred by: Maria [name] PEDLAR, MD  Subjective: Chief Complaint  Patient presents with   Right Foot - Follow-up   Left Foot - Follow-up   HPI: Maria Becker is a pleasant 63 y.o. female who presents today for chronic bilateral foot pain and LE neuropathy.  [name]'s is still experiencing bilateral foot pain.  In the past her right has been worse, but the left is worse than the right.  A few months ago we did perform a few treatments of extracorporeal shockwave therapy for the plantar fascia and the plantar feet which helped both of her ailments, she would like to repeat this today.  She is managed on gabapentin  300 mg 3 times daily and does notice that this certainly helps her symptoms although does not resolve her symptoms.  In the past she had taken Lyrica as well.  She does work as a Interior and spatial designer and her feet pain will be worse after she has been on them throughout the day.  She did show me paperwork from integrative medicine doctor from 2017.  Also reviewed lab work from a hair analysis which showed abnormal levels of her potassium, manganese, chromium, boron in the past. At that time she was able to cut out gluten, corn, dairy, soy, sugar and had significant improvement in her whole body pain and neuropathy.   Pertinent ROS were reviewed with the patient and found to be negative unless otherwise specified above in HPI.   Assessment & Plan: Visit Diagnoses:  1. Bilateral foot pain   2. Neuropathy involving both lower extremities   3. Plantar fascia syndrome    Plan: Impression is acute on chronic bilateral foot pain with loss of transverse arch, underlying plantar fascia syndrome as well as bilateral lower extremity neuropathy.  We did proceed with extracorporeal shockwave therapy upon her request as she did have some improvement in her pain months  ago when performing this.  I would like to perform a few additional treatments to see what sort of Camitta benefit she has going forward.  She will continue her gabapentin  300 mg 3 times daily as this does help curb her symptoms.  I did send a referral to Dr. Lorilee, PMR, for consideration of Qutenza for her lower extremity neuropathy and other treatment considerations for her lower extremity neuropathy and her fibromyalgia.   Follow-up: Return in about 1 week (around 11/05/2023) for make 2 appts about 1-week apart (bilateral feet/PF).   Meds & Orders: No orders of the defined types were placed in this encounter.  No orders of the defined types were placed in this encounter.    Procedures: Procedure: ECSWT Indications:  Plantar fascia syndrome, LE neuropathy   Procedure Details Consent: Risks of procedure as well as the alternatives and risks of each were explained to the patient.  Verbal consent for procedure obtained. Time Out: Verified patient identification, verified procedure, site was marked, verified correct patient position. The area was cleaned with alcohol swab.     The Right and Left plantar fascia and plantar feet and underlying foot arch was targeted for Extracorporeal shockwave therapy.    Preset: Plantar fasciitis - Right Power Level: 110 mJ Frequency: 12 Hz Impulse/cycles: 2100 Head size: Regular   Preset: Plantar fasciitis - Left Power Level: 110 mJ Frequency: 12 Hz Impulse/cycles: 2100 Head size: Regular   Patient tolerated  procedure well without immediate complications.      Clinical History: No specialty comments available.  She reports that she has never smoked. She has never used smokeless tobacco. No results for input(s): HGBA1C, LABURIC in the last 8760 hours.  Objective:    Physical Exam  Gen: Well-appearing, in no acute distress; non-toxic CV: Well-perfused. Warm.  Resp: Breathing unlabored on room air; no wheezing. Psych: Fluid speech in  conversation; appropriate affect; normal thought process  Ortho Exam - Bilateral feet: There is tenderness globally throughout bilateral feet, worse on the plantar aspect.  There is increased tenderness over the medial band of the plantar fascia bilaterally although left greater than right.  There is widening of the forefoot with loss of transverse arch bilaterally.  There is some diminished skin turgor in the heels but there cap refill less than 2 seconds bilaterally.  Imaging: No results found.  Past Medical/Family/Surgical/Social History: Medications & Allergies reviewed per EMR, new medications updated. Patient Active Problem List   Diagnosis Date Noted   S/P laparoscopic supracervical hysterectomy 07/11/2023   Urge incontinence of urine 07/11/2023   Skin tag 07/11/2023   Encounter for screening fecal occult blood testing 07/11/2023   Encounter for gynecological examination with Papanicolaou smear of cervix 07/11/2023   Rectocele 07/11/2023   Status post total left knee replacement 07/22/2021   Unilateral primary osteoarthritis, left knee 06/13/2021   Dyspnea on exertion    Angina decubitus (HCC) 07/20/2015   CAD (coronary artery disease), native coronary artery, LAD on cardiac CT 07/20/2015   HTN (hypertension) 07/20/2015   Family history of premature CAD 07/20/2015   Hyperlipidemia LDL goal <70 07/20/2015   Angina at rest [facility]) 07/20/2015   Abnormal nuclear stress test    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 cath 07/2015: 30-50% mid-LAD stenosis but otherwise normal cors   Diastolic CHF, chronic (HCC)    Dyspnea    SOME SOB WITH EXERTION   Family history of adverse reaction to anesthesia    MOTHER SLOW TO WAKE UP   Fibromyalgia    GERD (gastroesophageal reflux disease)    Headache    History of kidney stones    HTN (hypertension) 07/20/2015    Hyperlipidemia    a. not currently on statin therapy   Hypothyroidism    Insomnia    Pneumonia    HX OF AS A BABY   Family History  Problem Relation Age of Onset   Diabetes Paternal Grandfather    Heart disease Paternal Grandfather    Diabetes Paternal Grandmother    Heart disease Paternal Grandmother    Diabetes Maternal Grandmother    Heart disease Maternal Grandmother    Diabetes Maternal Grandfather    Heart disease Maternal Grandfather    Melanoma Father    Diabetes Father    Macular degeneration Mother    COPD Mother    Heart attack Mother 46   Diabetes Mother    Aneurysm Mother    Diabetes Brother    Heart attack Brother 72   Kidney disease Brother    Heart disease Sister    Diabetes Sister    Macular degeneration Sister    Macular degeneration Maternal Aunt    Cancer - Colon Maternal Aunt    Kidney disease Maternal Aunt    Diabetes Maternal Aunt    Aneurysm Maternal Uncle  Heart attack Paternal Aunt    Hypothyroidism Paternal Aunt    Aneurysm Cousin    Past Surgical History:  Procedure Laterality Date   ABDOMINAL HYSTERECTOMY     BACK SURGERY     CARDIAC CATHETERIZATION N/A 07/20/2015   Procedure: Left Heart Cath and Coronary Angiography;  Surgeon: Victory LELON Sharps, MD;  Location: [facility] INVASIVE CV LAB;  Service: Cardiovascular;  Laterality: N/A;   CESAREAN SECTION     L4-L5 fusion     RIGHT/LEFT HEART CATH AND CORONARY ANGIOGRAPHY N/A 04/15/2021   Procedure: RIGHT/LEFT HEART CATH AND CORONARY ANGIOGRAPHY;  Surgeon: Verlin Lonni BIRCH, MD;  Location: [facility] INVASIVE CV LAB;  Service: Cardiovascular;  Laterality: N/A;   TOTAL KNEE ARTHROPLASTY Left 07/22/2021   Procedure: LEFT TOTAL KNEE ARTHROPLASTY;  Surgeon: Vernetta Lonni GRADE, MD;  Location: [facility];  Service: Orthopedics;  Laterality: Left;  RFNA   Social History   Occupational History   Occupation: self-employed  Tobacco Use   Smoking status: Never   Smokeless tobacco: Never  Vaping Use    Vaping status: Never Used  Substance and Sexual Activity   Alcohol use: Yes    Comment: occasional   Drug use: No   Sexual activity: Not Currently    Birth control/protection: Surgical    Comment: hyst

## 2023-11-01 DIAGNOSIS — E039 Hypothyroidism, unspecified: Secondary | ICD-10-CM | POA: Diagnosis not present

## 2023-11-01 DIAGNOSIS — M8580 Other specified disorders of bone density and structure, unspecified site: Secondary | ICD-10-CM | POA: Diagnosis not present

## 2023-11-01 DIAGNOSIS — R600 Localized edema: Secondary | ICD-10-CM | POA: Diagnosis not present

## 2023-11-01 DIAGNOSIS — Z6830 Body mass index (BMI) 30.0-30.9, adult: Secondary | ICD-10-CM | POA: Diagnosis not present

## 2023-11-01 DIAGNOSIS — F411 Generalized anxiety disorder: Secondary | ICD-10-CM | POA: Diagnosis not present

## 2023-11-01 DIAGNOSIS — F5105 Insomnia due to other mental disorder: Secondary | ICD-10-CM | POA: Diagnosis not present

## 2023-11-01 DIAGNOSIS — M797 Fibromyalgia: Secondary | ICD-10-CM | POA: Diagnosis not present

## 2023-11-01 DIAGNOSIS — M722 Plantar fascial fibromatosis: Secondary | ICD-10-CM | POA: Diagnosis not present

## 2023-11-01 DIAGNOSIS — Z713 Dietary counseling and surveillance: Secondary | ICD-10-CM | POA: Diagnosis not present

## 2023-11-01 DIAGNOSIS — Z7182 Exercise counseling: Secondary | ICD-10-CM | POA: Diagnosis not present

## 2023-11-01 DIAGNOSIS — I10 Essential (primary) hypertension: Secondary | ICD-10-CM | POA: Diagnosis not present

## 2023-11-05 ENCOUNTER — Encounter: Admitting: Physical Therapy

## 2023-11-05 ENCOUNTER — Ambulatory Visit: Admitting: Sports Medicine

## 2023-11-05 DIAGNOSIS — G5793 Unspecified mononeuropathy of bilateral lower limbs: Secondary | ICD-10-CM | POA: Diagnosis not present

## 2023-11-05 DIAGNOSIS — M79671 Pain in right foot: Secondary | ICD-10-CM

## 2023-11-05 DIAGNOSIS — M722 Plantar fascial fibromatosis: Secondary | ICD-10-CM

## 2023-11-05 DIAGNOSIS — Z1231 Encounter for screening mammogram for malignant neoplasm of breast: Secondary | ICD-10-CM | POA: Diagnosis not present

## 2023-11-05 DIAGNOSIS — M79672 Pain in left foot: Secondary | ICD-10-CM

## 2023-11-05 NOTE — Progress Notes (Unsigned)
 Patient says that she might be a bit better, but is overall about the same. She says that she has not received a call to schedule with Dr. Lorilee, but if advised would give their office a call. That referral is currently received and pending review since 10/29/2023.

## 2023-11-05 NOTE — Progress Notes (Unsigned)
 Maria Becker - 63 y.o. female MRN [id number]  Date of birth: [birth year]  Office Visit Note: Visit Date: 11/05/2023 PCP: Shona [name] PEDLAR, MD Referred by: Shona [name] PEDLAR, MD  Subjective: Chief Complaint  Patient presents with  . Right Foot - Follow-up  . Left Foot - Follow-up   HPI: Maria Becker is a pleasant 63 y.o. female who presents today for ***  Pertinent ROS were reviewed with the patient and found to be negative unless otherwise specified above in HPI.   Assessment & Plan: Visit Diagnoses: No diagnosis found.  Plan: ***  Follow-up: No follow-ups on file.   Meds & Orders: No orders of the defined types were placed in this encounter.  No orders of the defined types were placed in this encounter.    Procedures: No procedures performed      Clinical History: No specialty comments available.  She reports that she has never smoked. She has never used smokeless tobacco. No results for input(s): HGBA1C, LABURIC in the last 8760 hours.  Objective:   Vital Signs: There were no vitals taken for this visit.  Physical Exam  Gen: Well-appearing, in no acute distress; non-toxic CV: Well-perfused. Warm.  Resp: Breathing unlabored on room air; no wheezing. Psych: Fluid speech in conversation; appropriate affect; normal thought process  Ortho Exam - ***  Imaging: No results found.  Past Medical/Family/Surgical/Social History: Medications & Allergies reviewed per EMR, new medications updated. Patient Active Problem List   Diagnosis Date Noted  . S/P laparoscopic supracervical hysterectomy 07/11/2023  . Urge incontinence of urine 07/11/2023  . Skin tag 07/11/2023  . Encounter for screening fecal occult blood testing 07/11/2023  . Encounter for gynecological examination with Papanicolaou smear of cervix 07/11/2023  . Rectocele 07/11/2023  . Status post total left knee replacement 07/22/2021  . Unilateral primary osteoarthritis, left knee 06/13/2021  . Dyspnea  on exertion   . Angina decubitus (HCC) 07/20/2015  . CAD (coronary artery disease), native coronary artery, LAD on cardiac CT 07/20/2015  . HTN (hypertension) 07/20/2015  . Family history of premature CAD 07/20/2015  . Hyperlipidemia LDL goal <70 07/20/2015  . Angina at rest [facility]) 07/20/2015  . Abnormal nuclear stress test   . 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 Arthritis   . 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 CAD (coronary artery disease)    a. cath 07/2015: 30-50% mid-LAD stenosis but otherwise normal cors  . Diastolic CHF, chronic (HCC)   . Dyspnea    SOME SOB WITH EXERTION  . Family history of adverse reaction to anesthesia    MOTHER SLOW TO WAKE UP  . Fibromyalgia   . GERD (gastroesophageal reflux disease)   . Headache   . History of kidney stones   . HTN (hypertension) 07/20/2015  . Hyperlipidemia    a. not currently on statin therapy  . Hypothyroidism   . Insomnia   . Pneumonia    HX OF AS A BABY   Family History  Problem Relation Age of Onset  . Diabetes Paternal Grandfather   . Heart disease Paternal Grandfather   . Diabetes Paternal Grandmother   . Heart disease Paternal Grandmother   . Diabetes Maternal Grandmother   . Heart disease Maternal Grandmother   . Diabetes Maternal Grandfather   . Heart disease Maternal Grandfather   . Melanoma Father   . Diabetes Father   . Macular degeneration Mother   . COPD Mother   .  Heart attack Mother 9  . Diabetes Mother   . Aneurysm Mother   . Diabetes Brother   . Heart attack Brother 58  . Kidney disease Brother   . Heart disease Sister   . Diabetes Sister   . Macular degeneration Sister   . Macular degeneration Maternal Aunt   . Cancer - Colon Maternal Aunt   . Kidney disease Maternal Aunt   . Diabetes Maternal Aunt   . Aneurysm Maternal Uncle   . Heart attack Paternal Aunt   . Hypothyroidism Paternal Aunt   . Aneurysm Cousin    Past Surgical History:   Procedure Laterality Date  . ABDOMINAL HYSTERECTOMY    . BACK SURGERY    . CARDIAC CATHETERIZATION N/A 07/20/2015   Procedure: Left Heart Cath and Coronary Angiography;  Surgeon: Victory LELON Sharps, MD;  Location: [facility] INVASIVE CV LAB;  Service: Cardiovascular;  Laterality: N/A;  . CESAREAN SECTION    . L4-L5 fusion    . RIGHT/LEFT HEART CATH AND CORONARY ANGIOGRAPHY N/A 04/15/2021   Procedure: RIGHT/LEFT HEART CATH AND CORONARY ANGIOGRAPHY;  Surgeon: Verlin Lonni BIRCH, MD;  Location: [facility] INVASIVE CV LAB;  Service: Cardiovascular;  Laterality: N/A;  . TOTAL KNEE ARTHROPLASTY Left 07/22/2021   Procedure: LEFT TOTAL KNEE ARTHROPLASTY;  Surgeon: Vernetta Lonni GRADE, MD;  Location: [facility];  Service: Orthopedics;  Laterality: Left;  RFNA   Social History   Occupational History  . Occupation: self-employed  Tobacco Use  . Smoking status: Never  . Smokeless tobacco: Never  Vaping Use  . Vaping status: Never Used  Substance and Sexual Activity  . Alcohol use: Yes    Comment: occasional  . Drug use: No  . Sexual activity: Not Currently    Birth control/protection: Surgical    Comment: hyst

## 2023-11-06 ENCOUNTER — Encounter: Payer: Self-pay | Admitting: Sports Medicine

## 2023-11-08 ENCOUNTER — Encounter: Payer: Self-pay | Admitting: Physical Medicine and Rehabilitation

## 2023-11-13 ENCOUNTER — Encounter: Payer: Self-pay | Admitting: Sports Medicine

## 2023-11-13 ENCOUNTER — Ambulatory Visit (INDEPENDENT_AMBULATORY_CARE_PROVIDER_SITE_OTHER): Admitting: Sports Medicine

## 2023-11-13 DIAGNOSIS — M722 Plantar fascial fibromatosis: Secondary | ICD-10-CM | POA: Diagnosis not present

## 2023-11-13 DIAGNOSIS — M79672 Pain in left foot: Secondary | ICD-10-CM

## 2023-11-13 DIAGNOSIS — G5793 Unspecified mononeuropathy of bilateral lower limbs: Secondary | ICD-10-CM

## 2023-11-13 DIAGNOSIS — M79671 Pain in right foot: Secondary | ICD-10-CM | POA: Diagnosis not present

## 2023-11-13 NOTE — Progress Notes (Signed)
 Maria Becker - 63 y.o. female MRN [id number]  Date of birth: 01-02-1961  Office Visit Note: Visit Date: 11/13/2023 PCP: Maria [name] PEDLAR, MD Referred by: Maria [name] PEDLAR, MD  Subjective: Chief Complaint  Patient presents with   Right Foot - Follow-up   Left Foot - Follow-up   HPI: Maria Becker is a pleasant 63 y.o. female who presents today for f/u of  chronic bilateral foot pain and LE neuropathy.   Maria Becker has noticed some improvement in her foot pain and her plantar fascia syndrome after 2 consecutive treatments of extracorporeal shockwave therapy.  She would like to continue this as she is finding benefit.  Initially her right foot was more bothersome, but she is feeling more pain near the left heel/plantar fascia.  She continues on gabapentin  300 mg in the day and evening time.  Does have an appointment scheduled in October with Dr. Lorilee.  Pertinent ROS were reviewed with the patient and found to be negative unless otherwise specified above in HPI.   Assessment & Plan: Visit Diagnoses:  1. Bilateral foot pain   2. Neuropathy involving both lower extremities   3. Plantar fascia syndrome    Plan: Impression is chronic bilateral foot pain which is multifactorial with both lower extremity neuropathy as well as underlying plantar fasciitis/syndrome of the left > right.  She also has a degree of biomechanical pain from her pes planus and loss of transverse arch as well.  She has found benefit from two consecutive treatments of extracorporeal shockwave therapy, we did repeat this again today.  She will continue her gabapentin  300 mg twice daily to 3 times daily.  She has been consistent with her stretching and plantar fascia strengthening exercises, she will continue this as well as good supportive shoe wear.  We could consider green sports insole with longitudinal and metatarsal padding in the future.  Follow-up: Return in about 20 days (around 12/03/2023) for B/l feet/neuropathy.    Meds & Orders: No orders of the defined types were placed in this encounter.  No orders of the defined types were placed in this encounter.    Procedures: Procedure: ECSWT Indications:  Plantar fascia syndrome, LE neuropathy   Procedure Details Consent: Risks of procedure as well as the alternatives and risks of each were explained to the patient.  Verbal consent for procedure obtained. Time Out: Verified patient identification, verified procedure, site was marked, verified correct patient position. The area was cleaned with alcohol swab.     The Right and Left plantar fascia and plantar feet and underlying foot arch was targeted for Extracorporeal shockwave therapy.    Preset: Plantar fasciitis - Right Power Level: 120 mJ Frequency: 13 Hz Impulse/cycles: 2300 Head size: Regular   Preset: Plantar fasciitis - Left Power Level: 120 mJ Frequency: 13 Hz Impulse/cycles: 2400 Head size: Regular   Patient tolerated procedure well without immediate complications.      Clinical History: No specialty comments available.  She reports that she has never smoked. She has never used smokeless tobacco. No results for input(s): HGBA1C, LABURIC in the last 8760 hours.  Objective:    Physical Exam  Gen: Well-appearing, in no acute distress; non-toxic CV: Well-perfused. Warm.  Resp: Breathing unlabored on room air; no wheezing. Psych: Fluid speech in conversation; appropriate affect; normal thought process  Ortho Exam - Bilateral feet: Pes planus bilaterally with widening of the transverse arch.  There are 2 callus on the medial aspect of the plantar big toe.  Positive TTP over the medial band of the plantar fascia left > right.   Imaging: No results found.  Past Medical/Family/Surgical/Social History: Medications & Allergies reviewed per EMR, new medications updated. Patient Active Problem List   Diagnosis Date Noted   S/P laparoscopic supracervical hysterectomy 07/11/2023    Urge incontinence of urine 07/11/2023   Skin tag 07/11/2023   Encounter for screening fecal occult blood testing 07/11/2023   Encounter for gynecological examination with Papanicolaou smear of cervix 07/11/2023   Rectocele 07/11/2023   Status post total left knee replacement 07/22/2021   Unilateral primary osteoarthritis, left knee 06/13/2021   Dyspnea on exertion    Angina decubitus (HCC) 07/20/2015   CAD (coronary artery disease), native coronary artery, LAD on cardiac CT 07/20/2015   HTN (hypertension) 07/20/2015   Family history of premature CAD 07/20/2015   Hyperlipidemia LDL goal <70 07/20/2015   Angina at rest [facility]) 07/20/2015   Abnormal nuclear stress test    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 cath 07/2015: 30-50% mid-LAD stenosis but otherwise normal cors   Diastolic CHF, chronic (HCC)    Dyspnea    SOME SOB WITH EXERTION   Family history of adverse reaction to anesthesia    MOTHER SLOW TO WAKE UP   Fibromyalgia    GERD (gastroesophageal reflux disease)    Headache    History of kidney stones    HTN (hypertension) 07/20/2015   Hyperlipidemia    a. not currently on statin therapy   Hypothyroidism    Insomnia    Pneumonia    HX OF AS A BABY   Family History  Problem Relation Age of Onset   Diabetes Paternal Grandfather    Heart disease Paternal Grandfather    Diabetes Paternal Grandmother    Heart disease Paternal Grandmother    Diabetes Maternal Grandmother    Heart disease Maternal Grandmother    Diabetes Maternal Grandfather    Heart disease Maternal Grandfather    Melanoma Father    Diabetes Father    Macular degeneration Mother    COPD Mother    Heart attack Mother 38   Diabetes Mother    Aneurysm Mother    Diabetes Brother    Heart attack Brother 65   Kidney disease Brother    Heart disease Sister    Diabetes Sister     Macular degeneration Sister    Macular degeneration Maternal Aunt    Cancer - Colon Maternal Aunt    Kidney disease Maternal Aunt    Diabetes Maternal Aunt    Aneurysm Maternal Uncle    Heart attack Paternal Aunt    Hypothyroidism Paternal Aunt    Aneurysm Cousin    Past Surgical History:  Procedure Laterality Date   ABDOMINAL HYSTERECTOMY     BACK SURGERY     CARDIAC CATHETERIZATION N/A 07/20/2015   Procedure: Left Heart Cath and Coronary Angiography;  Surgeon: Victory LELON Sharps, MD;  Location: [facility];  Service: Cardiovascular;  Laterality: N/A;   CESAREAN SECTION     L4-L5 fusion     RIGHT/LEFT HEART CATH AND CORONARY ANGIOGRAPHY N/A 04/15/2021   Procedure: RIGHT/LEFT HEART CATH AND CORONARY ANGIOGRAPHY;  Surgeon: Verlin Lonni BIRCH, MD;  Location: [facility];  Service: Cardiovascular;  Laterality: N/A;   TOTAL KNEE ARTHROPLASTY Left 07/22/2021   Procedure: LEFT TOTAL  KNEE ARTHROPLASTY;  Surgeon: Vernetta Lonni GRADE, MD;  Location: [facility];  Service: Orthopedics;  Laterality: Left;  RFNA   Social History   Occupational History   Occupation: self-employed  Tobacco Use   Smoking status: Never   Smokeless tobacco: Never  Vaping Use   Vaping status: Never Used  Substance and Sexual Activity   Alcohol use: Yes    Comment: occasional   Drug use: No   Sexual activity: Not Currently    Birth control/protection: Surgical    Comment: hyst

## 2023-11-13 NOTE — Progress Notes (Signed)
 Patient says that she is a bit better than she was last visit. She says that she has been doing a foot detox which will give her about a day of relief at a time. She did that this morning, so feels pretty good now, and is hopeful that it lasts longer.

## 2023-11-18 DIAGNOSIS — R030 Elevated blood-pressure reading, without diagnosis of hypertension: Secondary | ICD-10-CM | POA: Diagnosis not present

## 2023-11-18 DIAGNOSIS — N390 Urinary tract infection, site not specified: Secondary | ICD-10-CM | POA: Diagnosis not present

## 2023-11-19 ENCOUNTER — Ambulatory Visit: Admitting: Sports Medicine

## 2023-11-23 ENCOUNTER — Encounter: Payer: Self-pay | Admitting: Sports Medicine

## 2023-11-23 ENCOUNTER — Other Ambulatory Visit: Payer: Self-pay

## 2023-11-23 ENCOUNTER — Ambulatory Visit: Admitting: Sports Medicine

## 2023-11-23 DIAGNOSIS — M7121 Synovial cyst of popliteal space [Baker], right knee: Secondary | ICD-10-CM | POA: Diagnosis not present

## 2023-11-23 DIAGNOSIS — G5793 Unspecified mononeuropathy of bilateral lower limbs: Secondary | ICD-10-CM

## 2023-11-23 DIAGNOSIS — M79671 Pain in right foot: Secondary | ICD-10-CM | POA: Diagnosis not present

## 2023-11-23 DIAGNOSIS — M1711 Unilateral primary osteoarthritis, right knee: Secondary | ICD-10-CM

## 2023-11-23 DIAGNOSIS — M722 Plantar fascial fibromatosis: Secondary | ICD-10-CM

## 2023-11-23 DIAGNOSIS — M79672 Pain in left foot: Secondary | ICD-10-CM | POA: Diagnosis not present

## 2023-11-23 DIAGNOSIS — G8929 Other chronic pain: Secondary | ICD-10-CM | POA: Diagnosis not present

## 2023-11-23 DIAGNOSIS — M25561 Pain in right knee: Secondary | ICD-10-CM | POA: Diagnosis not present

## 2023-11-23 NOTE — Progress Notes (Signed)
 Maria Becker - 63 y.o. female MRN [id number]  Date of birth: 1960/10/11  Office Visit Note: Visit Date: 11/23/2023 PCP: Shona [name] PEDLAR, MD Referred by: Shona [name] PEDLAR, MD  Subjective: Chief Complaint  Patient presents with   Right Foot - Follow-up   Left Foot - Follow-up   HPI: Maria Becker is a pleasant 63 y.o. female who presents today for chronic bilateral foot pain with LE neuropathy. Also with pain/swelling behind right knee.  Plan is still doing better in terms of her feet with extracorporeal shockwave therapy, her stretches/home exercises as well as her gabapentin  twice daily-3 times daily.  She did not receive as much improvement from last shockwave therapy but still cumulatively feels better.  She also reports feeling a pressure and swelling in the back part of the right knee.  She has known osteoarthritis and is doing rehab currently to try to offload knee replacement.  She feels a pulling sensation later in the day when she is on her feet.  Pertinent ROS were reviewed with the patient and found to be negative unless otherwise specified above in HPI.   Assessment & Plan: Visit Diagnoses:  1. Chronic pain of right knee   2. Primary osteoarthritis of right knee   3. Bilateral foot pain   4. Neuropathy involving both lower extremities   5. Plantar fascia syndrome   6. Baker's cyst of knee, right    Plan: Impression is chronic bilateral foot pain which is multifactorial with both lower extremity neuropathy as well as underlying plantar fascial syndrome.  She also has rather significant pes planus and pain in the metatarsal arch with loss of transverse arch.  We did repeat extracorporeal shockwave therapy today.  We did discuss getting her into green sports insoles with metatarsal and possible longitudinal arch support, she is interested in this and we will consider this at next visit.  We also evaluated her left knee which MRI and x-ray shows advancing osteoarthritis,  bedside ultrasound demonstrates a Baker's cyst.  We discussed the nature of this with fluctuating size and need/desire for aspiration.  She is doing okay for now but this is something we could consider in the future.  She will continue her gabapentin  300 mg twice to 3 times daily.  May use over-the-counter anti-inflammatories for the knee and feet as needed.  Meds & Orders: No orders of the defined types were placed in this encounter.   Orders Placed This Encounter  Procedures   US  Extrem Low Right Ltd     Procedures: Procedure: ECSWT Indications:  Plantar fascia syndrome, LE neuropathy   Procedure Details Consent: Risks of procedure as well as the alternatives and risks of each were explained to the patient.  Verbal consent for procedure obtained. Time Out: Verified patient identification, verified procedure, site was marked, verified correct patient position. The area was cleaned with alcohol swab.     The Right and Left plantar fascia and plantar feet and underlying foot arch was targeted for Extracorporeal shockwave therapy.    Preset: Plantar fasciitis - Right Power Level: 120 mJ Frequency: 13 Hz Impulse/cycles: 2400 Head size: Regular   Preset: Plantar fasciitis - Left Power Level: 120 mJ Frequency: 13 Hz Impulse/cycles: 2400 Head size: Regular   Patient tolerated procedure well without immediate complications.       Clinical History: No specialty comments available.  She reports that she has never smoked. She has never used smokeless tobacco. No results for input(s): HGBA1C, LABURIC in  the last 8760 hours.  Objective:    Physical Exam  Gen: Well-appearing, in no acute distress; non-toxic CV: Well-perfused. Warm.  Resp: Breathing unlabored on room air; no wheezing. Psych: Fluid speech in conversation; appropriate affect; normal thought process  Ortho Exam - Bilateral feet: There is pes planus noted with widening of the transverse arch and tenderness on the  plantar aspect of the metatarsal arch.  - Right knee: + mild to moderate effusion and prominence in the popliteal fossa of the knee, likely Baker's cyst palpated.   Imaging: US  Extrem Low Right Ltd Result Date: 11/23/2023 Evaluation of the posterior right knee on musculoskeletal ultrasound was performed today.  There is a hypoechoic region in the popliteal fossa between the medial and hypergastrinemia's with visualized extension into the posterior knee joint, indicative of Baker's cyst.  This is 3 x 2 cm in short axis and 6 x 2.5 cm in long axis.  No significant Doppler uptake.    Narrative & Impression  MR KNEE WITHOUT IV CONTRAST RIGHT   COMPARISON: None.   CLINICAL HISTORY: Right knee pain.   PULSE SEQUENCES: Ax PD FS, Sag T2 ACL, Sag PD FS, Cor PD FS & COR T1   FINDINGS: Bones: There is no fracture or contusion pattern. There is moderate tricompartmental osteoarthrosis most notably of the lateral compartment and lateral patellofemoral compartment. There is slight lateral translation of the patella. There is a least near full-thickness cartilage loss and subchondral reactive edema in the lateral facet of patella and lateral tibial plateau. There is a moderate-sized reactive joint effusion. There is a moderate-sized leaking Baker's cyst.   Ligaments: The ACL, PCL, MCL and fibular collateral ligament are intact.   Menisci: Medial meniscus is unremarkable. There is abnormal signal in the anterior horn and anterior body lateral meniscus suspicious for a nondisplaced horizontal tear of the anterior horn and body.   IMPRESSION: Tricompartmental osteoarthrosis most notably of the lateral and lateral patellofemoral compartment. There is a moderate-sized reactive joint effusion and moderate-sized leaking Baker's cyst.   Nondisplaced horizontal tear of the anterior horn and anterior body of the lateral meniscus.   Electronically signed by: [name] Satchel MD 08/07/2023 10:51 AM EDT  RP Workstation: [id number]    Past Medical/Family/Surgical/Social History: Medications & Allergies reviewed per EMR, new medications updated. Patient Active Problem List   Diagnosis Date Noted   S/P laparoscopic supracervical hysterectomy 07/11/2023   Urge incontinence of urine 07/11/2023   Skin tag 07/11/2023   Encounter for screening fecal occult blood testing 07/11/2023   Encounter for gynecological examination with Papanicolaou smear of cervix 07/11/2023   Rectocele 07/11/2023   Status post total left knee replacement 07/22/2021   Unilateral primary osteoarthritis, left knee 06/13/2021   Dyspnea on exertion    Angina decubitus (HCC) 07/20/2015   CAD (coronary artery disease), native coronary artery, LAD on cardiac CT 07/20/2015   HTN (hypertension) 07/20/2015   Family history of premature CAD 07/20/2015   Hyperlipidemia LDL goal <70 07/20/2015   Angina at rest [facility]) 07/20/2015   Abnormal nuclear stress test    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 cath 07/2015: 30-50% mid-LAD stenosis but otherwise normal cors   Diastolic CHF, chronic (HCC)    Dyspnea    SOME SOB WITH EXERTION   Family history of adverse reaction to anesthesia    MOTHER SLOW  TO WAKE UP   Fibromyalgia    GERD (gastroesophageal reflux disease)    Headache    History of kidney stones    HTN (hypertension) 07/20/2015   Hyperlipidemia    a. not currently on statin therapy   Hypothyroidism    Insomnia    Pneumonia    HX OF AS A BABY   Family History  Problem Relation Age of Onset   Diabetes Paternal Grandfather    Heart disease Paternal Grandfather    Diabetes Paternal Grandmother    Heart disease Paternal Grandmother    Diabetes Maternal Grandmother    Heart disease Maternal Grandmother    Diabetes Maternal Grandfather    Heart disease Maternal Grandfather    Melanoma  Father    Diabetes Father    Macular degeneration Mother    COPD Mother    Heart attack Mother 12   Diabetes Mother    Aneurysm Mother    Diabetes Brother    Heart attack Brother 64   Kidney disease Brother    Heart disease Sister    Diabetes Sister    Macular degeneration Sister    Macular degeneration Maternal Aunt    Cancer - Colon Maternal Aunt    Kidney disease Maternal Aunt    Diabetes Maternal Aunt    Aneurysm Maternal Uncle    Heart attack Paternal Aunt    Hypothyroidism Paternal Aunt    Aneurysm Cousin    Past Surgical History:  Procedure Laterality Date   ABDOMINAL HYSTERECTOMY     BACK SURGERY     CARDIAC CATHETERIZATION N/A 07/20/2015   Procedure: Left Heart Cath and Coronary Angiography;  Surgeon: Victory LELON Sharps, MD;  Location: [facility];  Service: Cardiovascular;  Laterality: N/A;   CESAREAN SECTION     L4-L5 fusion     RIGHT/LEFT HEART CATH AND CORONARY ANGIOGRAPHY N/A 04/15/2021   Procedure: RIGHT/LEFT HEART CATH AND CORONARY ANGIOGRAPHY;  Surgeon: Verlin Lonni BIRCH, MD;  Location: [facility];  Service: Cardiovascular;  Laterality: N/A;   TOTAL KNEE ARTHROPLASTY Left 07/22/2021   Procedure: LEFT TOTAL KNEE ARTHROPLASTY;  Surgeon: Vernetta Lonni GRADE, MD;  Location: [facility];  Service: Orthopedics;  Laterality: Left;  RFNA   Social History   Occupational History   Occupation: self-employed  Tobacco Use   Smoking status: Never   Smokeless tobacco: Never  Vaping Use   Vaping status: Never Used  Substance and Sexual Activity   Alcohol use: Yes    Comment: occasional   Drug use: No   Sexual activity: Not Currently    Birth control/protection: Surgical    Comment: hyst

## 2023-11-28 DIAGNOSIS — R1084 Generalized abdominal pain: Secondary | ICD-10-CM | POA: Diagnosis not present

## 2023-11-28 DIAGNOSIS — R10814 Left lower quadrant abdominal tenderness: Secondary | ICD-10-CM | POA: Diagnosis not present

## 2023-11-28 DIAGNOSIS — R194 Change in bowel habit: Secondary | ICD-10-CM | POA: Diagnosis not present

## 2023-11-28 DIAGNOSIS — K219 Gastro-esophageal reflux disease without esophagitis: Secondary | ICD-10-CM | POA: Diagnosis not present

## 2023-11-29 ENCOUNTER — Ambulatory Visit: Admitting: Sports Medicine

## 2023-11-29 ENCOUNTER — Encounter: Payer: Self-pay | Admitting: Sports Medicine

## 2023-11-29 ENCOUNTER — Other Ambulatory Visit: Payer: Self-pay | Admitting: Gastroenterology

## 2023-11-29 DIAGNOSIS — G5793 Unspecified mononeuropathy of bilateral lower limbs: Secondary | ICD-10-CM

## 2023-11-29 DIAGNOSIS — M79671 Pain in right foot: Secondary | ICD-10-CM | POA: Diagnosis not present

## 2023-11-29 DIAGNOSIS — M79672 Pain in left foot: Secondary | ICD-10-CM

## 2023-11-29 DIAGNOSIS — R194 Change in bowel habit: Secondary | ICD-10-CM

## 2023-11-29 DIAGNOSIS — R1084 Generalized abdominal pain: Secondary | ICD-10-CM

## 2023-11-29 DIAGNOSIS — M2142 Flat foot [pes planus] (acquired), left foot: Secondary | ICD-10-CM

## 2023-11-29 DIAGNOSIS — M2141 Flat foot [pes planus] (acquired), right foot: Secondary | ICD-10-CM

## 2023-11-29 DIAGNOSIS — M722 Plantar fascial fibromatosis: Secondary | ICD-10-CM

## 2023-11-29 DIAGNOSIS — R10814 Left lower quadrant abdominal tenderness: Secondary | ICD-10-CM

## 2023-11-29 NOTE — Progress Notes (Signed)
 Patient says that she got about 4 days of great relief from the last shockwave therapy. She does have an appointment next week for insoles.

## 2023-11-29 NOTE — Progress Notes (Signed)
 LURLIE Becker - 63 y.o. female MRN [id number]  Date of birth: 07/06/1960  Office Visit Note: Visit Date: 11/29/2023 PCP: Shona [name] PEDLAR, MD Referred by: Shona [name] PEDLAR, MD  Subjective: Chief Complaint  Patient presents with   Right Foot - Follow-up   Left Foot - Follow-up   HPI: Maria Becker is a pleasant 63 y.o. female who presents today for follow-up of chronic bilateral foot pain with plantar fascia syndrome and underlying lower extremity neuropathy.  She noticed rather significant improvement in pain reduction after our last treatment of extracorporeal shockwave therapy, we have performed a number of these treatments which she has found benefit from.  Still has pain as well as some swelling at the end of the day with prolonged standing as she does work as a Interior and spatial designer.  She has had some gastrointestinal issues and did recently complete a course of ciprofloxacin.  Does have an upcoming CT abdomen pelvis to further evaluate.  She was off of gabapentin  during this time and would like to see about discontinuing this to see response going forward.  She is interested in getting fitted for insoles with scaphoid and metatarsal padding which we will do for her at her follow-up visit.  Pertinent ROS were reviewed with the patient and found to be negative unless otherwise specified above in HPI.   Assessment & Plan: Visit Diagnoses:  1. Bilateral foot pain   2. Plantar fascia syndrome   3. Neuropathy involving both lower extremities   4. Pes planus of both feet    Plan: Impression is chronic bilateral foot pain which is a combination of functional pain from her significant pes planus and metatarsalgia from transverse arch loss as well as plantar fascia syndrome.  She has received benefit from extracorporeal shockwave therapy, we did repeat this again today.  She does have an appointment upcoming with Dr. Lorilee to further evaluate this as well as her underlying neuropathy, I do think she  would benefit from Qutenza treatment.  Given that she had transition off of gabapentin  300 mg 3 times daily during ciprofloxacin dosing, I would like her to discontinue this going forward to see how her pain is controlled and if in fact she needs this medication/giving her benefit.  She is okay with over-the-counter anti-inflammatories as needed.  We will take a holiday in terms of shockwave treatment and see what sort of cumulative benefit she has going forward over the next 6+ weeks.  I will see her back next week to further evaluate if the and fit her for orthotic insoles with appropriate padding to help support both the transverse and longitudinal arch.  Meds & Orders: No orders of the defined types were placed in this encounter.  No orders of the defined types were placed in this encounter.    Procedures: Procedure: ECSWT Indications:  Plantar fascia syndrome, LE neuropathy   Procedure Details Consent: Risks of procedure as well as the alternatives and risks of each were explained to the patient.  Verbal consent for procedure obtained. Time Out: Verified patient identification, verified procedure, site was marked, verified correct patient position. The area was cleaned with alcohol swab.     The Right and Left plantar fascia and plantar feet and underlying foot arch was targeted for Extracorporeal shockwave therapy.    Preset: Plantar fasciitis - Right Power Level: 120 mJ Frequency: 13 Hz Impulse/cycles: 2500 Head size: Regular   Preset: Plantar fasciitis - Left Power Level: 120 mJ Frequency: 13 Hz  Impulse/cycles: 2500 Head size: Regular   Patient tolerated procedure well without immediate complications.       Clinical History: No specialty comments available.  She reports that she has never smoked. She has never used smokeless tobacco. No results for input(s): HGBA1C, LABURIC in the last 8760 hours.  Objective:    Physical Exam  Gen: Well-appearing, in no acute  distress; non-toxic CV: Well-perfused. Warm.  Resp: Breathing unlabored on room air; no wheezing. Psych: Fluid speech in conversation; appropriate affect; normal thought process  Ortho Exam - Bilateral feet: There is mild tenderness near the medial band of the plantar fascia that extends over the plantar midfoot.  No redness or swelling.  There is notable pes planus with widening of the transverse arch bilaterally and callus formation over the medial aspect of the great toe left > right.  Imaging: No results found.  Past Medical/Family/Surgical/Social History: Medications & Allergies reviewed per EMR, new medications updated. Patient Active Problem List   Diagnosis Date Noted   S/P laparoscopic supracervical hysterectomy 07/11/2023   Urge incontinence of urine 07/11/2023   Skin tag 07/11/2023   Encounter for screening fecal occult blood testing 07/11/2023   Encounter for gynecological examination with Papanicolaou smear of cervix 07/11/2023   Rectocele 07/11/2023   Status post total left knee replacement 07/22/2021   Unilateral primary osteoarthritis, left knee 06/13/2021   Dyspnea on exertion    Angina decubitus (HCC) 07/20/2015   CAD (coronary artery disease), native coronary artery, LAD on cardiac CT 07/20/2015   HTN (hypertension) 07/20/2015   Family history of premature CAD 07/20/2015   Hyperlipidemia LDL goal <70 07/20/2015   Angina at rest [facility]) 07/20/2015   Abnormal nuclear stress test    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 cath 07/2015: 30-50% mid-LAD stenosis but otherwise normal cors   Diastolic CHF, chronic (HCC)    Dyspnea    SOME SOB WITH EXERTION   Family history of adverse reaction to anesthesia    MOTHER SLOW TO WAKE UP   Fibromyalgia    GERD (gastroesophageal reflux disease)    Headache    History of kidney stones    HTN  (hypertension) 07/20/2015   Hyperlipidemia    a. not currently on statin therapy   Hypothyroidism    Insomnia    Pneumonia    HX OF AS A BABY   Family History  Problem Relation Age of Onset   Diabetes Paternal Grandfather    Heart disease Paternal Grandfather    Diabetes Paternal Grandmother    Heart disease Paternal Grandmother    Diabetes Maternal Grandmother    Heart disease Maternal Grandmother    Diabetes Maternal Grandfather    Heart disease Maternal Grandfather    Melanoma Father    Diabetes Father    Macular degeneration Mother    COPD Mother    Heart attack Mother 46   Diabetes Mother    Aneurysm Mother    Diabetes Brother    Heart attack Brother 54   Kidney disease Brother    Heart disease Sister    Diabetes Sister    Macular degeneration Sister    Macular degeneration Maternal Aunt    Cancer - Colon Maternal Aunt    Kidney disease Maternal Aunt    Diabetes Maternal Aunt    Aneurysm Maternal Uncle    Heart  attack Paternal Aunt    Hypothyroidism Paternal Aunt    Aneurysm Cousin    Past Surgical History:  Procedure Laterality Date   ABDOMINAL HYSTERECTOMY     BACK SURGERY     CARDIAC CATHETERIZATION N/A 07/20/2015   Procedure: Left Heart Cath and Coronary Angiography;  Surgeon: Victory LELON Sharps, MD;  Location: [facility] INVASIVE CV LAB;  Service: Cardiovascular;  Laterality: N/A;   CESAREAN SECTION     L4-L5 fusion     RIGHT/LEFT HEART CATH AND CORONARY ANGIOGRAPHY N/A 04/15/2021   Procedure: RIGHT/LEFT HEART CATH AND CORONARY ANGIOGRAPHY;  Surgeon: Verlin Lonni BIRCH, MD;  Location: [facility] INVASIVE CV LAB;  Service: Cardiovascular;  Laterality: N/A;   TOTAL KNEE ARTHROPLASTY Left 07/22/2021   Procedure: LEFT TOTAL KNEE ARTHROPLASTY;  Surgeon: Vernetta Lonni GRADE, MD;  Location: [facility] ORS;  Service: Orthopedics;  Laterality: Left;  RFNA   Social History   Occupational History   Occupation: self-employed  Tobacco Use   Smoking status: Never   Smokeless  tobacco: Never  Vaping Use   Vaping status: Never Used  Substance and Sexual Activity   Alcohol use: Yes    Comment: occasional   Drug use: No   Sexual activity: Not Currently    Birth control/protection: Surgical    Comment: hyst

## 2023-12-03 ENCOUNTER — Other Ambulatory Visit: Payer: Self-pay

## 2023-12-03 ENCOUNTER — Ambulatory Visit: Admitting: Sports Medicine

## 2023-12-03 ENCOUNTER — Ambulatory Visit (INDEPENDENT_AMBULATORY_CARE_PROVIDER_SITE_OTHER): Admitting: Sports Medicine

## 2023-12-03 ENCOUNTER — Encounter: Payer: Self-pay | Admitting: Sports Medicine

## 2023-12-03 DIAGNOSIS — M2142 Flat foot [pes planus] (acquired), left foot: Secondary | ICD-10-CM | POA: Diagnosis not present

## 2023-12-03 DIAGNOSIS — M2141 Flat foot [pes planus] (acquired), right foot: Secondary | ICD-10-CM | POA: Diagnosis not present

## 2023-12-03 DIAGNOSIS — M7121 Synovial cyst of popliteal space [Baker], right knee: Secondary | ICD-10-CM

## 2023-12-03 DIAGNOSIS — M79671 Pain in right foot: Secondary | ICD-10-CM

## 2023-12-03 DIAGNOSIS — M1711 Unilateral primary osteoarthritis, right knee: Secondary | ICD-10-CM | POA: Diagnosis not present

## 2023-12-03 DIAGNOSIS — M25561 Pain in right knee: Secondary | ICD-10-CM

## 2023-12-03 DIAGNOSIS — M79672 Pain in left foot: Secondary | ICD-10-CM

## 2023-12-03 MED ORDER — PREDNISONE 20 MG PO TABS: ORAL_TABLET | ORAL | 0 refills | Status: AC

## 2023-12-03 NOTE — Progress Notes (Signed)
 [name] - 63 y.o. female MRN [id number]  Date of birth: 15-Jun-1960  Office Visit Note: Visit Date: 12/03/2023 PCP: Shona [name] PEDLAR, MD Referred by: Shona [name] PEDLAR, MD  Subjective: Chief Complaint  Patient presents with   Right Foot - Follow-up   Left Foot - Follow-up   HPI: Maria Becker is a pleasant 63 y.o. female who presents today for follow-up of bilateral foot pain, also with acute worsening of right knee pain with swelling.  Bilateral feet - both of her feet are quite bothersome today.  She is unsure about being fitted for the insoles today as her feet and her knee/other joints seem inflamed.   Right knee -this is much worse.  She had 2 days in a row where she slipped getting out of the shower and feels the knee has been worse and more swollen since then.  Both pain over the anterior knee/patella as well as the posterior knee where her Baker's cyst is located.  She has seen Dr. Vernetta for this and did have an MRI previously, at some point they discussed likely needing knee replacement although would like to hold on this for now as still recovering from the contralateral knee.  Pertinent ROS were reviewed with the patient and found to be negative unless otherwise specified above in HPI.   Assessment & Plan: Visit Diagnoses:  1. Unilateral primary osteoarthritis, right knee   2. Baker's cyst of knee, right   3. Bilateral foot pain   4. Pes planus of both feet   5. Acute pain of right knee    Plan: Impression is chronic advanced right knee osteoarthritis with enlarging Baker's cyst with acute exacerbation.  She has more swelling and pain that is limiting her motion and ADLs.  Through shared decision making, we did perform ultrasound-guided Baker's cyst aspiration, which yielded about 23 cc of clear yellow fluid.  Patient tolerated well, Ace wrap applied.  In terms of her bilateral foot pain, this has been recently worse as well as she does have a combination of pes  planus with loss of transverse arch as well as underlying neuropathy.  The one medication that does give her relief is oral steroids, she has not had this since early May, we did fill a prednisone  taper today.  I will see her back over the next 2-3 weeks to evaluate the above conditions, eventually would like to get her into green sports insoles with both longitudinal arch support as well as metatarsal padding.  We would do this at her follow-up and re-evaluate.  For her right knee, at some point she is likely looking at a knee replacement, may follow-up with Dr. Vernetta for this in the future.  Follow-up: Return in about 16 days (around 12/19/2023) for f/u in 2-3 weeks bil feet, R-knee (30-mins for gait eval and orthotic fitting).   Meds & Orders:  Meds ordered this encounter  Medications   predniSONE  (DELTASONE ) 20 MG tablet    Sig: Take 2 tablets (40mg  total) for 4 days, then 1 tablet (20mg ) for 4 additional days until completion    Dispense:  12 tablet    Refill:  0    Orders Placed This Encounter  Procedures   US  Extrem Low Right Ltd     Procedures:  After discussion on risks/benefits/indications, informed verbal consent was obtained and a timeout was performed. The patient was lying prone on exam table and the patient's knee was prepped with Chloraprep and alcohol swabs .  Utilizing ultrasound-guidance with the probe in a longitudinal position, the Baker's cyst was identified and the surrounding soft tissue area was anesthesized first with a 27G, 1.5 needle with approximately 3 cc of lidocaine  1% using sterile technique. Following this, using ultrasound guidance via an in-plane approach, an 18G, 1.5 needle was directed into cyst and approximately 23 cc's of clear, yellow fluid was aspirated from the cyst and posterior knee joint. Utilizing the same portal and ultrasound-guidance, a 2:1 bupivicaine:depo-medrol  injectate was administered into the aspirated cyst space. Patient tolerated the  procedure well without immediate complications. ACE compression wrap applied.      Clinical History: No specialty comments available.  She reports that she has never smoked. She has never used smokeless tobacco. No results for input(s): HGBA1C, LABURIC in the last 8760 hours.  Objective:    Physical Exam  Gen: Well-appearing, in no acute distress; non-toxic CV: Well-perfused. Warm.  Resp: Breathing unlabored on room air; no wheezing. Psych: Fluid speech in conversation; appropriate affect; normal thought process  Ortho Exam - Right knee: There is at least a mild effusion on the knee with warmth.  No erythema or overlying cellulitis.  There is a fullness in the posterior aspect of the knee.  There is pain with endrange flexion.  Mildly ballotable patella.  There is pain with McMurray's testing both on the medial and lateral joint.  Imaging: US  Extrem Low Right Ltd Result Date: 12/03/2023 Limited musculoskeletal ultrasound of the right knee both anteriorly and posterior was performed today.  There is a small joint effusion with mild hyperemia.  Overlying quadricep tendon shows mild tendinopathy without any evidence of tearing.  Patellar tendon is visualized in short and long axis without abnormality.  There is evidence of patellofemoral arthralgia noted.  Posteriorly there is a large Baker's cyst with hypoechoic fluid and mixed cartilaginous isoechoic change.  There is hyperemia noted in this location. *Procedurally successful ultrasound-guided Baker's cyst aspiration and injection  Narrative & Impression  MR KNEE WITHOUT IV CONTRAST RIGHT   COMPARISON: None.   CLINICAL HISTORY: Right knee pain.   PULSE SEQUENCES: Ax PD FS, Sag T2 ACL, Sag PD FS, Cor PD FS & COR T1   FINDINGS: Bones: There is no fracture or contusion pattern. There is moderate tricompartmental osteoarthrosis most notably of the lateral compartment and lateral patellofemoral compartment. There is slight lateral  translation of the patella. There is a least near full-thickness cartilage loss and subchondral reactive edema in the lateral facet of patella and lateral tibial plateau. There is a moderate-sized reactive joint effusion. There is a moderate-sized leaking Baker's cyst.   Ligaments: The ACL, PCL, MCL and fibular collateral ligament are intact.   Menisci: Medial meniscus is unremarkable. There is abnormal signal in the anterior horn and anterior body lateral meniscus suspicious for a nondisplaced horizontal tear of the anterior horn and body.   IMPRESSION: Tricompartmental osteoarthrosis most notably of the lateral and lateral patellofemoral compartment. There is a moderate-sized reactive joint effusion and moderate-sized leaking Baker's cyst.   Nondisplaced horizontal tear of the anterior horn and anterior body of the lateral meniscus.   Electronically signed by: [name] Satchel MD 08/07/2023 10:51 AM EDT RP Workstation: [id number]    Past Medical/Family/Surgical/Social History: Medications & Allergies reviewed per EMR, new medications updated. Patient Active Problem List   Diagnosis Date Noted   S/P laparoscopic supracervical hysterectomy 07/11/2023   Urge incontinence of urine 07/11/2023   Skin tag 07/11/2023   Encounter for screening fecal occult blood testing  07/11/2023   Encounter for gynecological examination with Papanicolaou smear of cervix 07/11/2023   Rectocele 07/11/2023   Status post total left knee replacement 07/22/2021   Unilateral primary osteoarthritis, left knee 06/13/2021   Dyspnea on exertion    Angina decubitus 07/20/2015   CAD (coronary artery disease), native coronary artery, LAD on cardiac CT 07/20/2015   HTN (hypertension) 07/20/2015   Family history of premature CAD 07/20/2015   Hyperlipidemia LDL goal <70 07/20/2015   Angina at rest [facility]) 07/20/2015   Abnormal nuclear stress test    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 cath 07/2015: 30-50% mid-LAD stenosis but otherwise normal cors   Diastolic CHF, chronic (HCC)    Dyspnea    SOME SOB WITH EXERTION   Family history of adverse reaction to anesthesia    MOTHER SLOW TO WAKE UP   Fibromyalgia    GERD (gastroesophageal reflux disease)    Headache    History of kidney stones    HTN (hypertension) 07/20/2015   Hyperlipidemia    a. not currently on statin therapy   Hypothyroidism    Insomnia    Pneumonia    HX OF AS A BABY   Family History  Problem Relation Age of Onset   Diabetes Paternal Grandfather    Heart disease Paternal Grandfather    Diabetes Paternal Grandmother    Heart disease Paternal Grandmother    Diabetes Maternal Grandmother    Heart disease Maternal Grandmother    Diabetes Maternal Grandfather    Heart disease Maternal Grandfather    Melanoma Father    Diabetes Father    Macular degeneration Mother    COPD Mother    Heart attack Mother 36   Diabetes Mother    Aneurysm Mother    Diabetes Brother    Heart attack Brother 52   Kidney disease Brother    Heart disease Sister    Diabetes Sister    Macular degeneration Sister    Macular degeneration Maternal Aunt    Cancer - Colon Maternal Aunt    Kidney disease Maternal Aunt    Diabetes Maternal Aunt    Aneurysm Maternal Uncle    Heart attack Paternal Aunt    Hypothyroidism Paternal Aunt    Aneurysm Cousin    Past Surgical History:  Procedure Laterality Date   ABDOMINAL HYSTERECTOMY     BACK SURGERY     CARDIAC CATHETERIZATION N/A 07/20/2015   Procedure: Left Heart Cath and Coronary Angiography;  Surgeon: Victory LELON Sharps, MD;  Location: [facility] INVASIVE CV LAB;  Service: Cardiovascular;  Laterality: N/A;   CESAREAN SECTION     L4-L5 fusion     RIGHT/LEFT HEART CATH AND CORONARY ANGIOGRAPHY N/A 04/15/2021   Procedure: RIGHT/LEFT HEART CATH AND CORONARY ANGIOGRAPHY;   Surgeon: Verlin Lonni BIRCH, MD;  Location: [facility] INVASIVE CV LAB;  Service: Cardiovascular;  Laterality: N/A;   TOTAL KNEE ARTHROPLASTY Left 07/22/2021   Procedure: LEFT TOTAL KNEE ARTHROPLASTY;  Surgeon: Vernetta Lonni GRADE, MD;  Location: [facility] ORS;  Service: Orthopedics;  Laterality: Left;  RFNA   Social History   Occupational History   Occupation: self-employed  Tobacco Use   Smoking status: Never   Smokeless tobacco: Never  Vaping Use   Vaping status: Never Used  Substance and Sexual Activity   Alcohol use: Yes    Comment: occasional  Drug use: No   Sexual activity: Not Currently    Birth control/protection: Surgical    Comment: hyst

## 2023-12-03 NOTE — Progress Notes (Signed)
 Patient says that her feet and her right knee are very painful today. She slipped two days in a row when getting into the shower, and believes that the pain and swelling in the knee have come from that. She says that it feels like her patella is shifting over due to the swelling. She is unsure if she will be able to wear insoles, as any firm footwear has been very painful for her feet.

## 2023-12-05 ENCOUNTER — Ambulatory Visit
Admission: RE | Admit: 2023-12-05 | Discharge: 2023-12-05 | Disposition: A | Discharge status: Home / Self Care | Source: Ambulatory Visit | Attending: Gastroenterology | Admitting: Gastroenterology

## 2023-12-05 DIAGNOSIS — R10814 Left lower quadrant abdominal tenderness: Secondary | ICD-10-CM

## 2023-12-05 DIAGNOSIS — K5730 Diverticulosis of large intestine without perforation or abscess without bleeding: Secondary | ICD-10-CM | POA: Diagnosis not present

## 2023-12-05 DIAGNOSIS — R1084 Generalized abdominal pain: Secondary | ICD-10-CM

## 2023-12-05 DIAGNOSIS — R194 Change in bowel habit: Secondary | ICD-10-CM

## 2023-12-05 MED ORDER — DIPHENHYDRAMINE HCL 25 MG PO CAPS
25.0000 mg | ORAL_CAPSULE | Freq: Once | ORAL | Status: AC
  Administered 2023-12-05: 25 mg via ORAL

## 2023-12-05 MED ORDER — IOPAMIDOL (ISOVUE-300) INJECTION 61%
100.0000 mL | Freq: Once | INTRAVENOUS | Status: AC | PRN
  Administered 2023-12-05: 100 mL via INTRAVENOUS

## 2023-12-05 NOTE — Progress Notes (Signed)
 RN called to CT area to check on pt regarding possible reaction to CT Scan dye, 25 mg po benadryl  given per order by Laymon DEL., NP, pt appears calm, with no signs of distress, pt brought to nursing area, sitting in chair, see flowsheets for VS and MAR for meds, pt denies pain, states she sneezed a couple of times, no rash or hives present, no respiratory distress noted, safety maintained  Pt dc'd home at approximately 1135, ok by [place], NP  PIV to RAC discontinued, tolerated well, gauze with co-band in place, catheter remains intact upon being discontinued

## 2023-12-12 DIAGNOSIS — G4730 Sleep apnea, unspecified: Secondary | ICD-10-CM | POA: Diagnosis not present

## 2024-01-03 ENCOUNTER — Encounter: Payer: Self-pay | Admitting: Physical Medicine and Rehabilitation

## 2024-01-03 ENCOUNTER — Encounter: Attending: Physical Medicine and Rehabilitation | Admitting: Physical Medicine and Rehabilitation

## 2024-01-03 VITALS — BP 136/84 | HR 89 | Ht 67.0 in | Wt 218.0 lb

## 2024-01-03 DIAGNOSIS — M1712 Unilateral primary osteoarthritis, left knee: Secondary | ICD-10-CM | POA: Diagnosis not present

## 2024-01-03 DIAGNOSIS — G629 Polyneuropathy, unspecified: Secondary | ICD-10-CM | POA: Insufficient documentation

## 2024-01-03 MED ORDER — TOPIRAMATE 25 MG PO TABS: 25.0000 mg | ORAL_TABLET | Freq: Every evening | ORAL | 3 refills | Status: DC

## 2024-01-03 NOTE — Patient Instructions (Signed)
 Foods that may reduce pain: 1) Ginger (especially studied for arthritis)- reduce leukotriene production to decrease inflammation 2) Blueberries- high in phytonutrients that decrease inflammation 3) Salmon- marine omega-3s reduce joint swelling and pain 4) Pumpkin seeds- reduce inflammation 5) dark chocolate- reduces inflammation 6) turmeric- reduces inflammation 7) tart cherries - reduce pain and stiffness 8) extra virgin olive oil - its compound olecanthal helps to block prostaglandins  9) chili peppers- can be eaten or applied topically via capsaicin 10) mint- helpful for headache, muscle aches, joint pain, and itching 11) garlic- reduces inflammation 12) Green tea- reduces inflammation and oxidative stress, helps with weight loss, may reduce the risk of cancer, recommend Double Green Matcha [place] of Tea daily  Link to further information on diet for chronic pain: [email or web address]

## 2024-01-03 NOTE — Progress Notes (Signed)
 Subjective:    Patient ID: Maria Becker, female    DOB: [birth year], 63 y.o.   MRN: [id number]  HPI [name] is a 63 year old woman who presents to establish care for chronic neuropathy of bilateral feet  1) Chronic neuropathy of bilateral feet: -discussed that this started suddenly 06/12/22. -she is not diabetic -HgbA1c is 5.6 on less check   2) Left knee OA -she had 2 surgeries for her knee, left, in 2023- she completed her therapy -she hurt her knee when she was a young girl- she competed on skates and had an injury where her knee popped out    Family History  Problem Relation Age of Onset   Diabetes Paternal Grandfather    Heart disease Paternal Grandfather    Diabetes Paternal Grandmother    Heart disease Paternal Grandmother    Diabetes Maternal Grandmother    Heart disease Maternal Grandmother    Diabetes Maternal Grandfather    Heart disease Maternal Grandfather    Melanoma Father    Diabetes Father    Macular degeneration Mother    COPD Mother    Heart attack Mother 39   Diabetes Mother    Aneurysm Mother    Diabetes Brother    Heart attack Brother 25   Kidney disease Brother    Heart disease Sister    Diabetes Sister    Macular degeneration Sister    Macular degeneration Maternal Aunt    Cancer - Colon Maternal Aunt    Kidney disease Maternal Aunt    Diabetes Maternal Aunt    Aneurysm Maternal Uncle    Heart attack Paternal Aunt    Hypothyroidism Paternal Aunt    Aneurysm Cousin    Social History   Socioeconomic History   Marital status: Married    Spouse name: Not on file   Number of children: 2   Years of education: 13   Highest education level: Not on file  Occupational History   Occupation: self-employed  Tobacco Use   Smoking status: Never   Smokeless tobacco: Never  Vaping Use   Vaping status: Never Used  Substance and Sexual Activity   Alcohol use: Yes    Comment: occasional   Drug use: No   Sexual activity: Not Currently     Birth control/protection: Surgical    Comment: hyst  Other Topics Concern   Not on file  Social History Narrative   Patient drinks 1 soda daily.   Patient is right handed.    Social Drivers of Corporate investment banker Strain: Low Risk  (07/11/2023)   Overall Financial Resource Strain (CARDIA)    Difficulty of Paying Living Expenses: Not very hard  Food Insecurity: No Food Insecurity (07/11/2023)   Hunger Vital Sign    Worried About Running Out of Food in the Last Year: Never true    Ran Out of Food in the Last Year: Never true  Transportation Needs: No Transportation Needs (07/11/2023)   PRAPARE - Administrator, Civil Service (Medical): No    Lack of Transportation (Non-Medical): No  Physical Activity: Insufficiently Active (07/11/2023)   Exercise Vital Sign    Days of Exercise per Week: 2 days    Minutes of Exercise per Session: 20 min  Stress: Stress Concern Present (07/11/2023)   [employer] of Occupational Health - Occupational Stress Questionnaire    Feeling of Stress : To some extent  Social Connections: Socially Integrated (07/11/2023)   Social Connection and  Isolation Panel    Frequency of Communication with Friends and Family: More than three times a week    Frequency of Social Gatherings with Friends and Family: Three times a week    Attends Religious Services: More than 4 times per year    Active Member of Clubs or Organizations: Yes    Attends Banker Meetings: More than 4 times per year    Marital Status: Married   Past Surgical History:  Procedure Laterality Date   ABDOMINAL HYSTERECTOMY     BACK SURGERY     CARDIAC CATHETERIZATION N/A 07/20/2015   Procedure: Left Heart Cath and Coronary Angiography;  Surgeon: Victory LELON Sharps, MD;  Location: [facility] INVASIVE CV LAB;  Service: Cardiovascular;  Laterality: N/A;   CESAREAN SECTION     L4-L5 fusion     RIGHT/LEFT HEART CATH AND CORONARY ANGIOGRAPHY N/A 04/15/2021   Procedure: RIGHT/LEFT  HEART CATH AND CORONARY ANGIOGRAPHY;  Surgeon: Verlin Lonni BIRCH, MD;  Location: [facility] INVASIVE CV LAB;  Service: Cardiovascular;  Laterality: N/A;   TOTAL KNEE ARTHROPLASTY Left 07/22/2021   Procedure: LEFT TOTAL KNEE ARTHROPLASTY;  Surgeon: Vernetta Lonni GRADE, MD;  Location: [facility];  Service: Orthopedics;  Laterality: Left;  RFNA   Past Medical History:  Diagnosis Date   Arthritis    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 cath 07/2015: 30-50% mid-LAD stenosis but otherwise normal cors   Diastolic CHF, chronic (HCC)    Dyspnea    SOME SOB WITH EXERTION   Family history of adverse reaction to anesthesia    MOTHER SLOW TO WAKE UP   Fibromyalgia    GERD (gastroesophageal reflux disease)    Headache    History of kidney stones    HTN (hypertension) 07/20/2015   Hyperlipidemia    a. not currently on statin therapy   Hypothyroidism    Insomnia    Pneumonia    HX OF AS A BABY   There were no vitals taken for this visit.  Opioid Risk Score:   Fall Risk Score:  `1  Depression screen [facility] 2/9     07/11/2023   11:35 AM  Depression screen PHQ 2/9  Decreased Interest 0  Down, Depressed, Hopeless 0  PHQ - 2 Score 0  Altered sleeping 1  Tired, decreased energy 1  Change in appetite 1  Feeling bad or failure about yourself  1  Trouble concentrating 0  Moving slowly or fidgety/restless 0  Suicidal thoughts 0  PHQ-9 Score 4    Review of Systems     Objective:   Physical Exam Gen: no distress, normal appearing HEENT: oral mucosa pink and moist, NCAT Cardio: Reg rate Chest: normal effort, normal rate of breathing Abd: soft, non-distended Ext: no edema Psych: pleasant, normal affect Skin: intact Neuro: Alert and oriented x3, decreased sensation in bilateral feet in whole foods       Assessment & Plan:   1) 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    -Discussed Qutenza as an option for neuropathic pain control. Discussed that this is a capsaicin patch, stronger than capsaicin cream. Discussed that it is currently approved for diabetic peripheral neuropathy and post-herpetic neuralgia, but that it has also shown benefit in treating other forms of neuropathy. Provided patient with link to site to learn more about the patch: https://www.clark.biz/. Discussed that the patch would be placed in office and  benefits usually last 3 months. Discussed that unintended exposure to capsaicin can cause severe irritation of eyes, mucous membranes, respiratory tract, and skin, but that Qutenza is a local treatment and does not have the systemic side effects of other nerve medications. Discussed that there may be pain, itching, erythema, and decreased sensory function associated with the application of Qutenza. Side effects usually subside within 1 week. A cold pack of analgesic medications can help with these side effects. Blood pressure can also be increased due to pain associated with administration of the patch.   -topamax  25mg  daily HS prn   -Discussed benefits of exercise in reducing pain. -Discussed following foods that may reduce pain: 1) Ginger (especially studied for arthritis)- reduce leukotriene production to decrease inflammation 2) Blueberries- high in phytonutrients that decrease inflammation 3) Salmon- marine omega-3s reduce joint swelling and pain 4) Pumpkin seeds- reduce inflammation 5) dark chocolate- reduces inflammation 6) turmeric- reduces inflammation 7) tart cherries - reduce pain and stiffness 8) extra virgin olive oil - its compound olecanthal helps to block prostaglandins  9) chili peppers- can be eaten or applied topically via capsaicin 10) mint- helpful for headache, muscle aches, joint pain, and itching 11) garlic- reduces inflammation 12) Green tea- reduces inflammation and oxidative stress, helps with weight loss, may reduce  the risk of cancer, recommend Double Green Matcha [place] of Tea daily  Link to further information on diet for chronic pain: http://www.bray.com/    2) Left knee OA: -discussed that she has had left knee replacement

## 2024-01-04 DIAGNOSIS — E785 Hyperlipidemia, unspecified: Secondary | ICD-10-CM | POA: Diagnosis not present

## 2024-01-04 DIAGNOSIS — R7303 Prediabetes: Secondary | ICD-10-CM | POA: Diagnosis not present

## 2024-01-04 DIAGNOSIS — M8580 Other specified disorders of bone density and structure, unspecified site: Secondary | ICD-10-CM | POA: Diagnosis not present

## 2024-01-08 DIAGNOSIS — I5032 Chronic diastolic (congestive) heart failure: Secondary | ICD-10-CM | POA: Diagnosis not present

## 2024-01-08 DIAGNOSIS — M797 Fibromyalgia: Secondary | ICD-10-CM | POA: Diagnosis not present

## 2024-01-08 DIAGNOSIS — F411 Generalized anxiety disorder: Secondary | ICD-10-CM | POA: Diagnosis not present

## 2024-01-08 DIAGNOSIS — E039 Hypothyroidism, unspecified: Secondary | ICD-10-CM | POA: Diagnosis not present

## 2024-01-08 DIAGNOSIS — R600 Localized edema: Secondary | ICD-10-CM | POA: Diagnosis not present

## 2024-01-08 DIAGNOSIS — I110 Hypertensive heart disease with heart failure: Secondary | ICD-10-CM | POA: Diagnosis not present

## 2024-01-08 DIAGNOSIS — M8580 Other specified disorders of bone density and structure, unspecified site: Secondary | ICD-10-CM | POA: Diagnosis not present

## 2024-01-08 DIAGNOSIS — E785 Hyperlipidemia, unspecified: Secondary | ICD-10-CM | POA: Diagnosis not present

## 2024-01-08 DIAGNOSIS — M722 Plantar fascial fibromatosis: Secondary | ICD-10-CM | POA: Diagnosis not present

## 2024-01-08 DIAGNOSIS — I10 Essential (primary) hypertension: Secondary | ICD-10-CM | POA: Diagnosis not present

## 2024-01-08 DIAGNOSIS — G4700 Insomnia, unspecified: Secondary | ICD-10-CM | POA: Diagnosis not present

## 2024-01-14 ENCOUNTER — Encounter: Payer: Self-pay | Admitting: Radiology

## 2024-01-16 DIAGNOSIS — K5730 Diverticulosis of large intestine without perforation or abscess without bleeding: Secondary | ICD-10-CM | POA: Diagnosis not present

## 2024-01-16 DIAGNOSIS — R194 Change in bowel habit: Secondary | ICD-10-CM | POA: Diagnosis not present

## 2024-01-16 DIAGNOSIS — Z83719 Family history of colon polyps, unspecified: Secondary | ICD-10-CM | POA: Diagnosis not present

## 2024-01-16 DIAGNOSIS — D127 Benign neoplasm of rectosigmoid junction: Secondary | ICD-10-CM | POA: Diagnosis not present

## 2024-01-16 DIAGNOSIS — K6389 Other specified diseases of intestine: Secondary | ICD-10-CM | POA: Diagnosis not present

## 2024-01-16 DIAGNOSIS — K635 Polyp of colon: Secondary | ICD-10-CM | POA: Diagnosis not present

## 2024-01-18 DIAGNOSIS — K6389 Other specified diseases of intestine: Secondary | ICD-10-CM | POA: Diagnosis not present

## 2024-01-18 DIAGNOSIS — D127 Benign neoplasm of rectosigmoid junction: Secondary | ICD-10-CM | POA: Diagnosis not present

## 2024-01-18 DIAGNOSIS — K635 Polyp of colon: Secondary | ICD-10-CM | POA: Diagnosis not present

## 2024-01-31 ENCOUNTER — Encounter: Payer: Self-pay | Admitting: Sports Medicine

## 2024-01-31 ENCOUNTER — Ambulatory Visit (INDEPENDENT_AMBULATORY_CARE_PROVIDER_SITE_OTHER): Admitting: Sports Medicine

## 2024-01-31 DIAGNOSIS — G5793 Unspecified mononeuropathy of bilateral lower limbs: Secondary | ICD-10-CM | POA: Diagnosis not present

## 2024-01-31 DIAGNOSIS — M722 Plantar fascial fibromatosis: Secondary | ICD-10-CM

## 2024-01-31 DIAGNOSIS — M79672 Pain in left foot: Secondary | ICD-10-CM

## 2024-01-31 DIAGNOSIS — M2142 Flat foot [pes planus] (acquired), left foot: Secondary | ICD-10-CM

## 2024-01-31 DIAGNOSIS — M79671 Pain in right foot: Secondary | ICD-10-CM

## 2024-01-31 DIAGNOSIS — M2141 Flat foot [pes planus] (acquired), right foot: Secondary | ICD-10-CM | POA: Diagnosis not present

## 2024-01-31 NOTE — Progress Notes (Signed)
 Maria Becker - 63 y.o. female MRN [id number]  Date of birth: [birth year]  Office Visit Note: Visit Date: 01/31/2024 PCP: Maria [name] PEDLAR, MD Referred by: Maria [name] PEDLAR, MD  Subjective: Chief Complaint  Patient presents with   Right Foot - Follow-up   Left Foot - Follow-up   HPI: Maria Becker is a pleasant 63 y.o. female who presents today for chronic bilateral plantar foot pain as well as lower extremity neuropathy.  She did see Dr. Lorilee on 01/03/2024, note reviewed today.  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  They are starting her with Qutenza treatments, has an upcoming appointment for this procedure in December.  They did start her on Topamax  25 mg nightly.  [name] has stopped all her previous medications including NSAIDs, gabapentin .  Does feel over the last few weeks her pain has been flared up.  Pertinent ROS were reviewed with the patient and found to be negative unless otherwise specified above in HPI.   Assessment & Plan: Visit Diagnoses:  1. Bilateral foot pain   2. Neuropathy involving both lower extremities   3. Plantar fascia syndrome   4. Pes planus of both feet    Plan: Impression is acute on chronic bilateral foot pain with plantar fascia syndrome in the setting of bilateral pes planus and underlying lower extremity neuropathy.  She will continue her Topamax  25 mg nightly, she may need to increase this to reach therapeutic dose but will follow-up with Dr. Lorilee specifically for this.  For her plantar fascia syndrome and plantar-based foot pain, we did proceed with a trial of extracorporeal shockwave therapy bilaterally.  Patient tolerated well.  Would like her to continue in her orthotics to help support both her longitudinal and transverse arch.  She will continue her home rehab exercises we gave her in the past for mobility as well as strengthening of the underlying plantar fascia.  She will be set up for Qutenza treatments upcoming in  December.  I will see her back over the next 1-2 weeks for repeat evaluation and we can consider additional shockwave or other treatment options depending on her degree of improvement.  Follow-up: Return for f/u in next 1-2 weeks for bilateral feet/PF (Reg visit).   Meds & Orders: No orders of the defined types were placed in this encounter.  No orders of the defined types were placed in this encounter.    Procedures: Procedure: ECSWT Indications:  Plantar fascia syndrome, LE neuropathy   Procedure Details Consent: Risks of procedure as well as the alternatives and risks of each were explained to the patient.  Verbal consent for procedure obtained. Time Out: Verified patient identification, verified procedure, site was marked, verified correct patient position. The area was cleaned with alcohol swab.     The Right and Left plantar fascia and plantar feet and underlying foot arch was targeted for Extracorporeal shockwave therapy.    Preset: Plantar fasciitis - Right Power Level: 120 mJ Frequency: 13 Hz Impulse/cycles: 2500 Head size: Regular   Preset: Plantar fasciitis - Left Power Level: 120 mJ Frequency: 13 Hz Impulse/cycles: 2500 Head size: Regular   Patient tolerated procedure well without immediate complications.       Clinical History: No specialty comments available.  She reports that she has never smoked. She has never used smokeless tobacco. No results for input(s): HGBA1C, LABURIC in the last 8760 hours.  Objective:    Physical Exam  Gen: Well-appearing, in no acute distress; non-toxic CV: Well-perfused.  Warm.  Resp: Breathing unlabored on room air; no wheezing. Psych: Fluid speech in conversation; appropriate affect; normal thought process  Ortho Exam - Bilateral feet: There is notable pes planus bilaterally.  There is widening of the transverse arch bilaterally in addition.  There is tenderness over the plantar aspect of the foot but moreso over the medial  band plantar fascia.  Imaging: No results found.  Past Medical/Family/Surgical/Social History: Medications & Allergies reviewed per EMR, new medications updated. Patient Active Problem List   Diagnosis Date Noted   S/P laparoscopic supracervical hysterectomy 07/11/2023   Urge incontinence of urine 07/11/2023   Skin tag 07/11/2023   Encounter for screening fecal occult blood testing 07/11/2023   Encounter for gynecological examination with Papanicolaou smear of cervix 07/11/2023   Rectocele 07/11/2023   Status post total left knee replacement 07/22/2021   Unilateral primary osteoarthritis, left knee 06/13/2021   Dyspnea on exertion    Angina decubitus 07/20/2015   CAD (coronary artery disease), native coronary artery, LAD on cardiac CT 07/20/2015   HTN (hypertension) 07/20/2015   Family history of premature CAD 07/20/2015   Hyperlipidemia LDL goal <70 07/20/2015   Angina at rest 07/20/2015   Abnormal nuclear stress test    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 cath 07/2015: 30-50% mid-LAD stenosis but otherwise normal cors   Diastolic CHF, chronic (HCC)    Dyspnea    SOME SOB WITH EXERTION   Family history of adverse reaction to anesthesia    MOTHER SLOW TO WAKE UP   Fibromyalgia    GERD (gastroesophageal reflux disease)    Headache    History of kidney stones    HTN (hypertension) 07/20/2015   Hyperlipidemia    a. not currently on statin therapy   Hypothyroidism    Insomnia    Pneumonia    HX OF AS A BABY   Family History  Problem Relation Age of Onset   Diabetes Paternal Grandfather    Heart disease Paternal Grandfather    Diabetes Paternal Grandmother    Heart disease Paternal Grandmother    Diabetes Maternal Grandmother    Heart disease Maternal Grandmother    Diabetes Maternal Grandfather    Heart disease Maternal Grandfather     Melanoma Father    Diabetes Father    Macular degeneration Mother    COPD Mother    Heart attack Mother 93   Diabetes Mother    Aneurysm Mother    Diabetes Brother    Heart attack Brother 68   Kidney disease Brother    Heart disease Sister    Diabetes Sister    Macular degeneration Sister    Macular degeneration Maternal Aunt    Cancer - Colon Maternal Aunt    Kidney disease Maternal Aunt    Diabetes Maternal Aunt    Aneurysm Maternal Uncle    Heart attack Paternal Aunt    Hypothyroidism Paternal Aunt    Aneurysm Cousin    Past Surgical History:  Procedure Laterality Date   ABDOMINAL HYSTERECTOMY     BACK SURGERY     CARDIAC CATHETERIZATION N/A 07/20/2015   Procedure: Left Heart Cath and Coronary Angiography;  Surgeon: Victory LELON Sharps, MD;  Location: [facility] INVASIVE CV LAB;  Service: Cardiovascular;  Laterality: N/A;   CESAREAN SECTION     L4-L5 fusion     RIGHT/LEFT  HEART CATH AND CORONARY ANGIOGRAPHY N/A 04/15/2021   Procedure: RIGHT/LEFT HEART CATH AND CORONARY ANGIOGRAPHY;  Surgeon: Verlin Lonni BIRCH, MD;  Location: [facility] INVASIVE CV LAB;  Service: Cardiovascular;  Laterality: N/A;   TOTAL KNEE ARTHROPLASTY Left 07/22/2021   Procedure: LEFT TOTAL KNEE ARTHROPLASTY;  Surgeon: Vernetta Lonni GRADE, MD;  Location: [facility];  Service: Orthopedics;  Laterality: Left;  RFNA   Social History   Occupational History   Occupation: self-employed  Tobacco Use   Smoking status: Never   Smokeless tobacco: Never  Vaping Use   Vaping status: Never Used  Substance and Sexual Activity   Alcohol use: Not Currently    Comment: occasional   Drug use: No   Sexual activity: Not Currently    Birth control/protection: Surgical    Comment: hyst

## 2024-01-31 NOTE — Progress Notes (Signed)
 Patient says that she has her first appointment for Qutenza scheduled for December 9th, but is in significant pain. She continues to be as active as she can, but is having pain in both feet that is preventing her from walking or standing for extended periods of time. She has changed her diet, but has not noticed any change with that. She is using a TENS unit at home. She would like to try shockwave therapy for the time leading up to her Qutenza treatment.

## 2024-02-12 ENCOUNTER — Encounter: Payer: Self-pay | Admitting: Sports Medicine

## 2024-02-12 ENCOUNTER — Ambulatory Visit: Admitting: Sports Medicine

## 2024-02-12 DIAGNOSIS — M79671 Pain in right foot: Secondary | ICD-10-CM | POA: Diagnosis not present

## 2024-02-12 DIAGNOSIS — G5793 Unspecified mononeuropathy of bilateral lower limbs: Secondary | ICD-10-CM | POA: Diagnosis not present

## 2024-02-12 DIAGNOSIS — M79672 Pain in left foot: Secondary | ICD-10-CM

## 2024-02-12 DIAGNOSIS — M722 Plantar fascial fibromatosis: Secondary | ICD-10-CM

## 2024-02-12 NOTE — Progress Notes (Signed)
 Maria Becker - 63 y.o. female MRN [id number]  Date of birth: 11-Dec-1960  Office Visit Note: Visit Date: 02/12/2024 PCP: Shona [name] PEDLAR, MD Referred by: Shona [name] PEDLAR, MD  Subjective: Chief Complaint  Patient presents with   Right Foot - Follow-up   Left Foot - Follow-up   HPI: Maria Becker is a pleasant 63 y.o. female who presents today for f/u of chronic bilateral foot pain with plantar fascia syndrome as well as lower extremity neuropathy.  She is established with Dr. Lorilee for PMR, working up and treating neuropathy.  She does have her first Qutenza treatment upcoming on 02/19/2024.  Off of gabapentin , continues on Topamax  25 mg nightly.  Pertinent ROS were reviewed with the patient and found to be negative unless otherwise specified above in HPI.   Assessment & Plan: Visit Diagnoses:  1. Bilateral foot pain   2. Plantar fascia syndrome   3. Neuropathy involving both lower extremities    Plan: Impression is chronic bilateral foot pain plantar fascia syndrome in the setting of pes planus as well as underlying lower extremity neuropathy.  She will continue her Topamax  25 mg nightly for her neuropathy.  We did proceed with her additional trial of extracorporeal shockwave therapy, we will see how she does for this over the coming weeks.  Did discuss need for shockwave only visits going forward if finding significantly helpful.  She will proceed with her Qutenza treatment next week.  Continue her home rehab exercises and good foot support/orthotics.  She will follow-up with me as needed.  Follow-up: Return if symptoms worsen or fail to improve.   Meds & Orders: No orders of the defined types were placed in this encounter.  No orders of the defined types were placed in this encounter.    Procedures: Procedure: ECSWT Indications:  Plantar fascia syndrome, LE neuropathy   Procedure Details Consent: Risks of procedure as well as the alternatives and risks of each were  explained to the patient.  Verbal consent for procedure obtained. Time Out: Verified patient identification, verified procedure, site was marked, verified correct patient position. The area was cleaned with alcohol swab.     The Right and Left plantar fascia and plantar feet and underlying foot arch was targeted for Extracorporeal shockwave therapy.    Preset: Plantar fasciitis - Right Power Level: 120 mJ  Frequency: 13 Hz Impulse/cycles: 2500 Head size: Regular   Preset: Plantar fasciitis - Left Power Level: 120 mJ Frequency: 13 Hz Impulse/cycles: 2500 Head size: Regular   Patient tolerated procedure well without immediate complications.       Clinical History: No specialty comments available.  She reports that she has never smoked. She has never used smokeless tobacco. No results for input(s): HGBA1C, LABURIC in the last 8760 hours.  Objective:    Physical Exam  Gen: Well-appearing, in no acute distress; non-toxic CV: Well-perfused. Warm.  Resp: Breathing unlabored on room air; no wheezing. Psych: Fluid speech in conversation; appropriate affect; normal thought process  Ortho Exam - Mild to moderate pes planus of both feet.  There is tenderness of the plantar aspect of the arch to the insertional medial plantar fascia band.  Imaging: No results found.  Past Medical/Family/Surgical/Social History: Medications & Allergies reviewed per EMR, new medications updated. Patient Active Problem List   Diagnosis Date Noted   S/P laparoscopic supracervical hysterectomy 07/11/2023   Urge incontinence of urine 07/11/2023   Skin tag 07/11/2023   Encounter for screening fecal occult blood  testing 07/11/2023   Encounter for gynecological examination with Papanicolaou smear of cervix 07/11/2023   Rectocele 07/11/2023   Status post total left knee replacement 07/22/2021   Unilateral primary osteoarthritis, left knee 06/13/2021   Dyspnea on exertion    Angina decubitus 07/20/2015    CAD (coronary artery disease), native coronary artery, LAD on cardiac CT 07/20/2015   HTN (hypertension) 07/20/2015   Family history of premature CAD 07/20/2015   Hyperlipidemia LDL goal <70 07/20/2015   Angina at rest 07/20/2015   Abnormal nuclear stress test    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 cath 07/2015: 30-50% mid-LAD stenosis but otherwise normal cors   Diastolic CHF, chronic (HCC)    Dyspnea    SOME SOB WITH EXERTION   Family history of adverse reaction to anesthesia    MOTHER SLOW TO WAKE UP   Fibromyalgia    GERD (gastroesophageal reflux disease)    Headache    History of kidney stones    HTN (hypertension) 07/20/2015   Hyperlipidemia    a. not currently on statin therapy   Hypothyroidism    Insomnia    Pneumonia    HX OF AS A BABY   Family History  Problem Relation Age of Onset   Diabetes Paternal Grandfather    Heart disease Paternal Grandfather    Diabetes Paternal Grandmother    Heart disease Paternal Grandmother    Diabetes Maternal Grandmother    Heart disease Maternal Grandmother    Diabetes Maternal Grandfather    Heart disease Maternal Grandfather    Melanoma Father    Diabetes Father    Macular degeneration Mother    COPD Mother    Heart attack Mother 42   Diabetes Mother    Aneurysm Mother    Diabetes Brother    Heart attack Brother 20   Kidney disease Brother    Heart disease Sister    Diabetes Sister    Macular degeneration Sister    Macular degeneration Maternal Aunt    Cancer - Colon Maternal Aunt    Kidney disease Maternal Aunt    Diabetes Maternal Aunt    Aneurysm Maternal Uncle    Heart attack Paternal Aunt    Hypothyroidism Paternal Aunt    Aneurysm Cousin    Past Surgical History:  Procedure Laterality Date   ABDOMINAL HYSTERECTOMY     BACK SURGERY     CARDIAC CATHETERIZATION N/A 07/20/2015    Procedure: Left Heart Cath and Coronary Angiography;  Surgeon: Victory LELON Sharps, MD;  Location: [facility];  Service: Cardiovascular;  Laterality: N/A;   CESAREAN SECTION     L4-L5 fusion     RIGHT/LEFT HEART CATH AND CORONARY ANGIOGRAPHY N/A 04/15/2021   Procedure: RIGHT/LEFT HEART CATH AND CORONARY ANGIOGRAPHY;  Surgeon: Verlin Lonni BIRCH, MD;  Location: [facility];  Service: Cardiovascular;  Laterality: N/A;   TOTAL KNEE ARTHROPLASTY Left 07/22/2021   Procedure: LEFT TOTAL KNEE ARTHROPLASTY;  Surgeon: Vernetta Lonni GRADE, MD;  Location: [facility];  Service: Orthopedics;  Laterality: Left;  RFNA   Social History   Occupational History   Occupation: self-employed  Tobacco Use   Smoking status: Never   Smokeless tobacco: Never  Vaping Use   Vaping status: Never Used  Substance and Sexual Activity   Alcohol use: Not Currently    Comment: occasional  Drug use: No   Sexual activity: Not Currently    Birth control/protection: Surgical    Comment: hyst

## 2024-02-12 NOTE — Progress Notes (Signed)
 Patient says that the shockwave therapy did help her for a few days. She has been taking Gabapentin  the last 4 nights after 1 month without it as her pain got so much worse, and is inquiring about another prescription of Prednisone . She did stop taking the Topamax  when she began taking the Gabapentin , as she was unsure if they could be taken together.

## 2024-02-15 ENCOUNTER — Telehealth: Payer: Self-pay

## 2024-02-15 DIAGNOSIS — G629 Polyneuropathy, unspecified: Secondary | ICD-10-CM

## 2024-02-15 MED ORDER — QUTENZA (4 PATCH) 8 % EX KIT: 4.0000 | PACK | Freq: Once | CUTANEOUS | 0 refills | Status: AC

## 2024-02-15 NOTE — Telephone Encounter (Signed)
 Rx sent to [facility]

## 2024-02-15 NOTE — Telephone Encounter (Signed)
(  KeyBETHA Becker) PA Case ID #: [id number] Need Help? Call us  at [phone] Status sent iconSent to Plan today Drug Qutenza  (4 Patch) 8% kit ePA cloud logo Form [facility] [employer] Medicare Electronic Prior Authorization Form 2017 NCPDP

## 2024-02-19 ENCOUNTER — Encounter: Attending: Physical Medicine and Rehabilitation | Admitting: Physical Medicine and Rehabilitation

## 2024-02-19 ENCOUNTER — Encounter: Payer: Self-pay | Admitting: Physical Medicine and Rehabilitation

## 2024-02-19 VITALS — HR 104 | Wt 217.2 lb

## 2024-02-19 DIAGNOSIS — G629 Polyneuropathy, unspecified: Secondary | ICD-10-CM | POA: Diagnosis not present

## 2024-02-19 MED ORDER — TOPIRAMATE 50 MG PO TABS: 50.0000 mg | ORAL_TABLET | Freq: Every evening | ORAL | 3 refills | Status: AC

## 2024-02-19 MED ORDER — METFORMIN HCL 500 MG PO TABS: 500.0000 mg | ORAL_TABLET | Freq: Every day | ORAL | 3 refills | Status: AC

## 2024-02-19 MED ORDER — CAPSAICIN-CLEANSING GEL 8 % EX KIT
4.0000 | PACK | Freq: Once | CUTANEOUS | Status: AC
  Administered 2024-02-19: 4 via TOPICAL

## 2024-02-19 MED ORDER — NALTREXONE HCL (PAIN) 1.5 MG PO CAPS: 1.0000 | ORAL_CAPSULE | Freq: Every evening | ORAL | 3 refills | Status: AC

## 2024-02-19 NOTE — Progress Notes (Signed)
-  Discussed Qutenza as an option for neuropathic pain control. Discussed that this is a capsaicin patch, stronger than capsaicin cream. Discussed that it is currently approved for diabetic peripheral neuropathy and post-herpetic neuralgia, but that it has also shown benefit in treating other forms of neuropathy. Provided patient with link to site to learn more about the patch: https://www.clark.biz/. Discussed that the patch would be placed in office and benefits usually last 3 months. Discussed that unintended exposure to capsaicin can cause severe irritation of eyes, mucous membranes, respiratory tract, and skin, but that Qutenza is a local treatment and does not have the systemic side effects of other nerve medications. Discussed that there may be pain, itching, erythema, and decreased sensory function associated with the application of Qutenza. Side effects usually subside within 1 week. A cold pack of analgesic medications can help with these side effects. Blood pressure can also be increased due to pain associated with administration of the patch.   4 patches of Qutenza [phone]) was applied to the bilateral feet. Ice packs were applied during the procedure to ensure patient comfort. Blood pressure was monitored every 15 minutes. The patient tolerated the procedure well. Post-procedure instructions were given and follow-up has been scheduled.  Topical system measures 14cm x20cm (280cm for a total 1120units) were applied which will cause deeper penetration for destruction of the peripheral nerve using a chemical (Qutenza) which infuses into the skin like an injection and heat technique (occlusive, compressive dressing cauing endothermic heat technique)

## 2024-02-21 ENCOUNTER — Telehealth: Payer: Self-pay

## 2024-02-21 NOTE — Telephone Encounter (Signed)
 Spoke to [place] at [employer] and they are waiting to get in touch with patient for insurance info.

## 2024-02-28 ENCOUNTER — Telehealth: Payer: Self-pay | Admitting: Neurology

## 2024-02-28 ENCOUNTER — Encounter: Payer: Self-pay | Admitting: Neurology

## 2024-02-28 ENCOUNTER — Ambulatory Visit: Admitting: Neurology

## 2024-02-28 VITALS — BP 141/80 | Ht 67.0 in | Wt 219.2 lb

## 2024-02-28 DIAGNOSIS — R202 Paresthesia of skin: Secondary | ICD-10-CM | POA: Diagnosis not present

## 2024-02-28 DIAGNOSIS — M542 Cervicalgia: Secondary | ICD-10-CM | POA: Insufficient documentation

## 2024-02-28 DIAGNOSIS — R52 Pain, unspecified: Secondary | ICD-10-CM | POA: Diagnosis not present

## 2024-02-28 DIAGNOSIS — R269 Unspecified abnormalities of gait and mobility: Secondary | ICD-10-CM | POA: Insufficient documentation

## 2024-02-28 DIAGNOSIS — R3915 Urgency of urination: Secondary | ICD-10-CM | POA: Insufficient documentation

## 2024-02-28 MED ORDER — DULOXETINE HCL 60 MG PO CPEP: 60.0000 mg | ORAL_CAPSULE | Freq: Every day | ORAL | 11 refills | Status: AC

## 2024-02-28 MED ORDER — DULOXETINE HCL 30 MG PO CPEP: 30.0000 mg | ORAL_CAPSULE | Freq: Every day | ORAL | 0 refills | Status: AC

## 2024-02-28 NOTE — Telephone Encounter (Signed)
 no auth required sent to GI [phone]

## 2024-02-28 NOTE — Progress Notes (Signed)
 Chief Complaint  Patient presents with   New Patient (Initial Visit)    Pt in room 14. Alone. Internal referral for neuropathy BLE - req NCV EMG.    ASSESSMENT AND PLAN  Maria Becker is a 63 y.o. female   History of lumbar decompression, chronic low back pain Bilateral feet and lower extremity paresthesia, body achy pain  MRI of lumbar to rule out lumbar radiculopathy/stenosis  Laboratory evaluation including inflammatory markers  Cymbalta  30 mg, titrating to 60 mg every day  Follow-up depend on workup result  DIAGNOSTIC DATA (LABS, IMAGING, TESTING) - I reviewed patient records, labs, notes, testing and imaging myself where available.   MEDICAL HISTORY:  Maria Becker is a 63 year old female, seen by Dr. Jenel and nurse practitioner Maria Becker in the past for chronic migraine, seen in request by Podiatrist Dr. Burnetta Becker for evaluation of bilateral lower extremity paresthesia, her primary care physician is Dr.  Shona, Maria Becker   History is obtained from the patient and review of electronic medical records. I personally reviewed pertinent available imaging films in PACS.   PMHx of  CAD HTN HLD Chronic migraine. Hypothyroidism since 2006 Lumbar in 2004, presented with low back pain, left radicular pain. Hx of left knee replacement Poor vision on left eye.  She has long history of chronic migraine, recently under good control, few times each month, Excedrin Migraine as needed was helpful.  Previously she was treated with Topamax  on and off for few years, did help her migraine  Today she came in with the new issue, sudden onset lower extremity paresthesia since April 2024, prior to that, she was trying to be physically active, ride bike regularly, woke up 1 morning had fairly acute onset lower extremity pain, starting from the feet, shooting upwards, getting worse bearing weight,  Over the past couple years, she was managed by podiatrist, was given a diagnosis of plantars  fasciitis, had steroid injection 6 round, which only provide few days help, then p.o. prednisone  tapering 3 rounds, did help her symptoms temporarily  She complains of diffuse body achy pain, difficulty bearing weight, started on gabapentin  300 mg 3 times a day since December 2025, works well for her,  She also complains of chronic low back pain, worsening urinary urgency, occasionally accident,  Has a known history of cervical degenerative disease, reviewed MRI cervical from 2018, multilevel degenerative changes, no significant canal stenosis, variable degree of foraminal narrowing  MRI of the brain with without contrast December 2016 was essentially normal  Laboratory from July 2025, normal CBC, folic acid , iron panel, protein electrophoresis, magnesium , TSH was elevated 6.1  PHYSICAL EXAM:   Vitals:   02/28/24 0823  BP: (!) 141/80  Weight: 219 lb 3.2 oz (99.4 kg)  Height: 5' 7 (1.702 m)   Not recorded     Body mass index is 34.33 kg/m.  PHYSICAL EXAMNIATION:  Gen: NAD, conversant, well nourised, well groomed                     Cardiovascular: Regular rate rhythm, no peripheral edema, warm, nontender. Eyes: Conjunctivae clear without exudates or hemorrhage Neck: Supple, no carotid bruits. Pulmonary: Clear to auscultation bilaterally   NEUROLOGICAL EXAM:  MENTAL STATUS: Speech/cognition: Awake, alert, oriented to history taking and casual conversation CRANIAL NERVES: CN II: Visual fields are full to confrontation. Pupils are round equal and briskly reactive to light. CN III, IV, VI: extraocular movement are normal. No ptosis. CN V: Facial sensation is  intact to light touch CN VII: Face is symmetric with normal eye closure  CN VIII: Hearing is normal to causal conversation. CN IX, X: Phonation is normal. CN XI: Head turning and shoulder shrug are intact  MOTOR: There is no pronator drift of out-stretched arms. Muscle bulk and tone are normal. Muscle strength is  normal.  REFLEXES: Reflexes are 1 and symmetric at the biceps, triceps, knees, and preserved ankle reflexes. Plantar responses are flexor.  SENSORY: Intact to light touch, pinprick and vibratory sensation are intact in fingers and toes.  COORDINATION: There is no trunk or limb dysmetria noted.  GAIT/STANCE: Push-up, antalgic  REVIEW OF SYSTEMS:  Full 14 system review of systems performed and notable only for as above All other review of systems were negative.   ALLERGIES: Allergies[1]  HOME MEDICATIONS: Current Outpatient Medications  Medication Sig Dispense Refill   gabapentin  (NEURONTIN ) 300 MG capsule Take 1 tablet (300mg ) in AM and 2 tablets (600mg ) in PM 90 capsule 1   metFORMIN  (GLUCOPHAGE ) 500 MG tablet Take 1 tablet (500 mg total) by mouth daily with breakfast. 90 tablet 3   Naltrexone  HCl, Pain, 1.5 MG CAPS Take 1 each by mouth at bedtime. 90 capsule 3   topiramate  (TOPAMAX ) 50 MG tablet Take 1 tablet (50 mg total) by mouth at bedtime. 90 tablet 3   aspirin  EC 81 MG tablet Take 1 tablet (81 mg total) by mouth daily. Swallow whole. (Patient not taking: Reported on 02/28/2024) 90 tablet 3   b complex vitamins tablet Take 1 tablet by mouth 2 (two) times a week. (Patient not taking: Reported on 02/28/2024)     fluticasone (FLONASE) 50 MCG/ACT nasal spray Place 1 spray into both nostrils. (Patient not taking: Reported on 02/28/2024)     furosemide  (LASIX ) 40 MG tablet Take one (1) tablet by mouth ( 40 mg) as needed for swelling. (Patient not taking: Reported on 02/28/2024) 30 tablet 3   liothyronine  (CYTOMEL ) 5 MCG tablet Take 10 mcg by mouth daily. (Patient not taking: Reported on 02/28/2024)     MAGNESIUM  PO Take 250 mg by mouth at bedtime. (Patient not taking: Reported on 02/28/2024)     Multiple Vitamins-Minerals (PRESERVISION AREDS) CAPS Take 1 capsule by mouth daily. (Patient not taking: Reported on 02/28/2024)     omeprazole (PRILOSEC) 40 MG capsule Take 40 mg by mouth  daily. (Patient not taking: Reported on 02/28/2024)     predniSONE  (DELTASONE ) 20 MG tablet Take 2 tablets (40mg  total) for 4 days, then 1 tablet (20mg ) for 4 additional days until completion (Patient not taking: Reported on 02/28/2024) 12 tablet 0   rosuvastatin  (CRESTOR ) 10 MG tablet Take 10 mg by mouth 3 (three) times a week. (Patient not taking: Reported on 02/28/2024)     solifenacin  (VESICARE ) 5 MG tablet Take 1 tablet (5 mg total) by mouth daily. (Patient not taking: Reported on 02/28/2024) 30 tablet 3   tamsulosin (FLOMAX) 0.4 MG CAPS capsule Take 0.4 mg by mouth daily as needed (Kidnsy stone). (Patient not taking: Reported on 02/28/2024)     UNABLE TO FIND Vitalax-2 tabs qhs (Patient not taking: Reported on 02/28/2024)     vitamin B-12 (CYANOCOBALAMIN ) 1000 MCG tablet Take 1,000 mcg by mouth daily. (Patient not taking: Reported on 02/28/2024)     Vitamin D , Ergocalciferol , (DRISDOL ) 1.25 MG (50000 UNIT) CAPS capsule Take 50,000 Units by mouth every Monday. (Patient not taking: Reported on 02/28/2024)     VITAMIN E PO Take 50 Units by mouth 2 (  two) times a week. (Patient not taking: Reported on 02/28/2024)     No current facility-administered medications for this visit.   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 cath 07/2015: 30-50% mid-LAD stenosis but otherwise normal cors   Diastolic CHF, chronic (HCC)    Dyspnea    SOME SOB WITH EXERTION   Family history of adverse reaction to anesthesia    MOTHER SLOW TO WAKE UP   Fibromyalgia    GERD (gastroesophageal reflux disease)    Headache    History of kidney stones    HTN (hypertension)  07/20/2015   Hyperlipidemia    a. not currently on statin therapy   Hypothyroidism    Insomnia    Pneumonia    HX OF AS A BABY    PAST SURGICAL HISTORY: Past Surgical History:  Procedure Laterality Date   ABDOMINAL HYSTERECTOMY     BACK SURGERY     CARDIAC CATHETERIZATION N/A 07/20/2015   Procedure: Left Heart Cath and Coronary Angiography;  Surgeon: Victory LELON Sharps, MD;  Location[facility] INVASIVE CV LAB;  Service: Cardiovascular;  Laterality: N/A;   CESAREAN SECTION     L4-L5 fusion     RIGHT/LEFT HEART CATH AND CORONARY ANGIOGRAPHY N/A 04/15/2021   Procedure: RIGHT/LEFT HEART CATH AND CORONARY ANGIOGRAPHY;  Surgeon: Verlin Lonni BIRCH, MD;  Location: [facility] INVASIVE CV LAB;  Service: Cardiovascular;  Laterality: N/A;   TOTAL KNEE ARTHROPLASTY Left 07/22/2021   Procedure: LEFT TOTAL KNEE ARTHROPLASTY;  Surgeon: Vernetta Lonni GRADE, MD;  Location: [facility] ORS;  Service: Orthopedics;  Laterality: Left;  RFNA    FAMILY HISTORY: Family History  Problem Relation Age of Onset   Diabetes Paternal Grandfather    Heart disease Paternal Grandfather    Diabetes Paternal Grandmother    Heart disease Paternal Grandmother    Diabetes Maternal Grandmother    Heart disease Maternal Grandmother    Diabetes Maternal Grandfather    Heart disease Maternal Grandfather    Melanoma Father    Diabetes Father    Macular degeneration Mother    COPD Mother    Heart attack Mother 62   Diabetes Mother    Aneurysm Mother    Diabetes Brother    Heart attack Brother 80   Kidney disease Brother    Heart disease Sister    Diabetes Sister    Macular degeneration Sister    Macular degeneration Maternal Aunt    Cancer - Colon Maternal Aunt    Kidney disease Maternal Aunt    Diabetes Maternal Aunt    Aneurysm Maternal Uncle    Heart attack Paternal Aunt    Hypothyroidism Paternal Aunt    Aneurysm Cousin     SOCIAL HISTORY: Social History   Socioeconomic History   Marital status: Married    Spouse  name: Not on file   Number of children: 2   Years of education: 13   Highest education level: Not on file  Occupational History   Occupation: self-employed  Tobacco Use   Smoking status: Never   Smokeless tobacco: Never  Vaping Use   Vaping status: Never Used  Substance and Sexual  Activity   Alcohol use: Not Currently    Comment: occasional   Drug use: No   Sexual activity: Not Currently    Birth control/protection: Surgical    Comment: hyst  Other Topics Concern   Not on file  Social History Narrative   Patient drinks 1 soda daily.   Patient is right handed.    Social Drivers of Health   Tobacco Use: Low Risk (02/28/2024)   Patient History    Smoking Tobacco Use: Never    Smokeless Tobacco Use: Never    Passive Exposure: Not on file  Financial Resource Strain: Low Risk (07/11/2023)   Overall Financial Resource Strain (CARDIA)    Difficulty of Paying Living Expenses: Not very hard  Food Insecurity: No Food Insecurity (07/11/2023)   Hunger Vital Sign    Worried About Running Out of Food in the Last Year: Never true    Ran Out of Food in the Last Year: Never true  Transportation Needs: No Transportation Needs (07/11/2023)   PRAPARE - Administrator, Civil Service (Medical): No    Lack of Transportation (Non-Medical): No  Physical Activity: Insufficiently Active (07/11/2023)   Exercise Vital Sign    Days of Exercise per Week: 2 days    Minutes of Exercise per Session: 20 min  Stress: Stress Concern Present (07/11/2023)   [employer] of Occupational Health - Occupational Stress Questionnaire    Feeling of Stress : To some extent  Social Connections: Socially Integrated (07/11/2023)   Social Connection and Isolation Panel    Frequency of Communication with Friends and Family: More than three times a week    Frequency of Social Gatherings with Friends and Family: Three times a week    Attends Religious Services: More than 4 times per year    Active Member of  Clubs or Organizations: Yes    Attends Banker Meetings: More than 4 times per year    Marital Status: Married  Catering Manager Violence: Not At Risk (07/11/2023)   Humiliation, Afraid, Rape, and Kick questionnaire    Fear of Current or Ex-Partner: No    Emotionally Abused: No    Physically Abused: No    Sexually Abused: No  Depression (PHQ2-9): Medium Risk (01/03/2024)   Depression (PHQ2-9)    PHQ-2 Score: 9  Alcohol Screen: Low Risk (07/11/2023)   Alcohol Screen    Last Alcohol Screening Score (AUDIT): 1  Housing: Low Risk (07/11/2023)   Housing Stability Vital Sign    Unable to Pay for Housing in the Last Year: No    Number of Times Moved in the Last Year: 0    Homeless in the Last Year: No  Utilities: Not At Risk (07/11/2023)   AHC Utilities    Threatened with loss of utilities: No  Health Literacy: Not on file      Modena Callander, M.D. Ph.D.  [facility] [address] [place], [place] [address] Ph: [phone] Fax: [phone]  CC:  Maria Brunet, DO [address] [place],  [place] [address]  Maria Becker Maria Becker PEDLAR, MD       [1]  Allergies Allergen Reactions   Statins     Leg pain on atorvastatin  10mg  daily   Isovue  [Iopamidol ] Other (See Comments)    Patient experienced multiple episodes of sneezing after injection of Isovue  300/141ml. Was given Benadryl  25ml by Stacy, RN and was followed by her. Brittany, Rad PA was the one taking care of her for this episode.  Other Other (See Comments)    Dairy cow milk/ constipation

## 2024-03-04 ENCOUNTER — Ambulatory Visit: Payer: Self-pay | Admitting: Neurology

## 2024-03-04 LAB — THYROID PANEL WITH TSH
Free Thyroxine Index: 1.9 (ref 1.2–4.9)
T3 Uptake Ratio: 25 % (ref 24–39)
T4, Total: 7.4 ug/dL (ref 4.5–12.0)
TSH: 5.78 u[IU]/mL — ABNORMAL HIGH (ref 0.450–4.500)

## 2024-03-04 LAB — VITAMIN B1: Thiamine: 151.8 nmol/L (ref 66.5–200.0)

## 2024-03-04 LAB — SEDIMENTATION RATE: Sed Rate: 17 mm/h (ref 0–40)

## 2024-03-04 LAB — COPPER, SERUM: Copper: 147 ug/dL (ref 80–158)

## 2024-03-04 LAB — CK: Total CK: 52 U/L (ref 32–182)

## 2024-03-04 LAB — C-REACTIVE PROTEIN: CRP: 8 mg/L (ref 0–10)

## 2024-03-04 LAB — RHEUMATOID FACTOR: Rheumatoid fact SerPl-aCnc: <10 [IU]/mL

## 2024-03-04 LAB — ANA W/REFLEX IF POSITIVE

## 2024-03-04 NOTE — Telephone Encounter (Signed)
Lab results sent to pcp

## 2024-03-04 NOTE — Telephone Encounter (Addendum)
 Called pt and informed her of lab results as noted below by Dr Gregg. Pt aware that Dr Onita will review when she is back and can make further comments regarding plan of care. At this time the results are being sent to Dr Shona (pcp) for follow-up on the thyroid  lab. Patient appreciative and her questions were answered.    ----- Message from Pastor Gregg, MD sent at 03/04/2024  9:20 AM EST ----- Please call and advise the patient that the recent labs we checked were within normal limits. Her TSH was slightly elevated but T3 and T4 were normal. Please have your PCP repeat the thyroid  studies  within 3 to 6 months. Please remind patient to keep any upcoming appointments or tests and to call us  with any interim questions, concerns, problems or updates. Thanks,   Pastor Gregg, MD   ----- Message ----- From: Hilliard Heather CROME, RN Sent: 03/04/2024   8:24 AM EST To: Pastor Gregg, MD  Will you result for Dr Onita?

## 2024-03-04 NOTE — Progress Notes (Signed)
 Please call and advise the patient that the recent labs we checked were within normal limits. Her TSH was slightly elevated but T3 and T4 were normal. Please have your PCP repeat the thyroid  studies within 3 to 6 months. Please remind patient to keep any upcoming appointments or tests and to call us  with any interim questions, concerns, problems or updates. Thanks,   Pastor Falling, MD

## 2024-03-10 ENCOUNTER — Other Ambulatory Visit: Payer: Self-pay | Admitting: Sports Medicine

## 2024-03-12 ENCOUNTER — Ambulatory Visit
Admission: RE | Admit: 2024-03-12 | Discharge: 2024-03-12 | Disposition: A | Discharge status: Home / Self Care | Source: Ambulatory Visit | Attending: Neurology | Admitting: Neurology

## 2024-03-12 DIAGNOSIS — R202 Paresthesia of skin: Secondary | ICD-10-CM

## 2024-03-12 DIAGNOSIS — R3915 Urgency of urination: Secondary | ICD-10-CM | POA: Diagnosis not present

## 2024-03-12 DIAGNOSIS — R269 Unspecified abnormalities of gait and mobility: Secondary | ICD-10-CM

## 2024-03-12 DIAGNOSIS — R52 Pain, unspecified: Secondary | ICD-10-CM

## 2024-03-12 DIAGNOSIS — M542 Cervicalgia: Secondary | ICD-10-CM

## 2024-03-14 ENCOUNTER — Other Ambulatory Visit

## 2024-04-14 ENCOUNTER — Ambulatory Visit: Admitting: Orthopaedic Surgery

## 2024-04-16 LAB — LAB REPORT - SCANNED
A1c: 5.5
Albumin, Urine POC: 3.3
Creatinine, POC: 39.5 mg/dL
EGFR: 65
Microalb Creat Ratio: 8

## 2024-04-23 ENCOUNTER — Ambulatory Visit: Admitting: Cardiovascular Disease

## 2024-04-24 ENCOUNTER — Ambulatory Visit: Admitting: Physician Assistant

## 2024-05-27 ENCOUNTER — Encounter: Admitting: Physical Medicine and Rehabilitation
